# Patient Record
Sex: Female | Born: 1940
Health system: Southern US, Community
[De-identification: ages and names within clinical notes are randomized; demographics above are authoritative.]

## PROBLEM LIST (undated history)

## (undated) DIAGNOSIS — M8580 Other specified disorders of bone density and structure, unspecified site: Secondary | ICD-10-CM

## (undated) DIAGNOSIS — N3941 Urge incontinence: Secondary | ICD-10-CM

## (undated) DIAGNOSIS — S82009A Unspecified fracture of unspecified patella, initial encounter for closed fracture: Secondary | ICD-10-CM

## (undated) DIAGNOSIS — I10 Essential (primary) hypertension: Secondary | ICD-10-CM

## (undated) DIAGNOSIS — C801 Malignant (primary) neoplasm, unspecified: Secondary | ICD-10-CM

## (undated) DIAGNOSIS — H269 Unspecified cataract: Secondary | ICD-10-CM

## (undated) DIAGNOSIS — M1990 Unspecified osteoarthritis, unspecified site: Secondary | ICD-10-CM

## (undated) DIAGNOSIS — K635 Polyp of colon: Secondary | ICD-10-CM

## (undated) HISTORY — PX: EYE SURGERY: SHX253

## (undated) HISTORY — PX: COLONOSCOPY: SHX5424

## (undated) HISTORY — PX: TUBAL LIGATION: SHX77

## (undated) HISTORY — DX: Urge incontinence: N39.41

## (undated) HISTORY — DX: Polyp of colon: K63.5

## (undated) HISTORY — PX: TONSILLECTOMY: SUR1361

## (undated) HISTORY — DX: Unspecified cataract: H26.9

## (undated) HISTORY — DX: Other specified disorders of bone density and structure, unspecified site: M85.80

## (undated) HISTORY — DX: Unspecified fracture of unspecified patella, initial encounter for closed fracture: S82.009A

---

## 2009-10-11 DIAGNOSIS — M8580 Other specified disorders of bone density and structure, unspecified site: Secondary | ICD-10-CM

## 2009-10-11 HISTORY — DX: Other specified disorders of bone density and structure, unspecified site: M85.80

## 2009-10-11 LAB — HM DEXA SCAN

## 2010-11-20 ENCOUNTER — Other Ambulatory Visit: Payer: Self-pay | Admitting: Family Medicine

## 2010-11-20 DIAGNOSIS — R921 Mammographic calcification found on diagnostic imaging of breast: Secondary | ICD-10-CM

## 2010-11-22 ENCOUNTER — Ambulatory Visit
Admission: RE | Admit: 2010-11-22 | Discharge: 2010-11-22 | Disposition: A | Discharge status: Home / Self Care | Payer: Medicare Other | Source: Ambulatory Visit | Attending: Family Medicine | Admitting: Family Medicine

## 2010-11-22 ENCOUNTER — Other Ambulatory Visit: Payer: Self-pay | Admitting: Family Medicine

## 2010-11-22 ENCOUNTER — Other Ambulatory Visit: Payer: Self-pay | Admitting: Diagnostic Radiology

## 2010-11-22 DIAGNOSIS — R921 Mammographic calcification found on diagnostic imaging of breast: Secondary | ICD-10-CM

## 2011-08-23 DIAGNOSIS — E785 Hyperlipidemia, unspecified: Secondary | ICD-10-CM | POA: Diagnosis not present

## 2011-08-23 DIAGNOSIS — E559 Vitamin D deficiency, unspecified: Secondary | ICD-10-CM | POA: Diagnosis not present

## 2011-08-23 DIAGNOSIS — R5383 Other fatigue: Secondary | ICD-10-CM | POA: Diagnosis not present

## 2011-08-23 DIAGNOSIS — R5381 Other malaise: Secondary | ICD-10-CM | POA: Diagnosis not present

## 2011-08-26 DIAGNOSIS — N3946 Mixed incontinence: Secondary | ICD-10-CM | POA: Diagnosis not present

## 2011-08-26 DIAGNOSIS — I10 Essential (primary) hypertension: Secondary | ICD-10-CM | POA: Diagnosis not present

## 2011-08-26 DIAGNOSIS — E785 Hyperlipidemia, unspecified: Secondary | ICD-10-CM | POA: Diagnosis not present

## 2011-09-24 DIAGNOSIS — Z0000 Encounter for general adult medical examination without abnormal findings: Secondary | ICD-10-CM | POA: Diagnosis not present

## 2011-09-24 DIAGNOSIS — Z124 Encounter for screening for malignant neoplasm of cervix: Secondary | ICD-10-CM | POA: Diagnosis not present

## 2011-10-23 DIAGNOSIS — H04129 Dry eye syndrome of unspecified lacrimal gland: Secondary | ICD-10-CM | POA: Diagnosis not present

## 2011-11-07 ENCOUNTER — Other Ambulatory Visit: Payer: Self-pay | Admitting: Family Medicine

## 2011-11-07 DIAGNOSIS — Z1231 Encounter for screening mammogram for malignant neoplasm of breast: Secondary | ICD-10-CM

## 2011-11-27 ENCOUNTER — Ambulatory Visit
Admission: RE | Admit: 2011-11-27 | Discharge: 2011-11-27 | Disposition: A | Discharge status: Home / Self Care | Payer: Medicare Other | Source: Ambulatory Visit | Attending: Family Medicine | Admitting: Family Medicine

## 2011-11-27 DIAGNOSIS — Z1231 Encounter for screening mammogram for malignant neoplasm of breast: Secondary | ICD-10-CM

## 2012-02-06 DIAGNOSIS — Z23 Encounter for immunization: Secondary | ICD-10-CM | POA: Diagnosis not present

## 2012-08-24 DIAGNOSIS — S82009A Unspecified fracture of unspecified patella, initial encounter for closed fracture: Secondary | ICD-10-CM | POA: Diagnosis not present

## 2012-08-25 ENCOUNTER — Encounter (HOSPITAL_COMMUNITY): Payer: Self-pay

## 2012-08-25 NOTE — Pre-Procedure Instructions (Signed)
MITZI LILJA  08/25/2012   Your procedure is scheduled on: Friday, April 18th             (Take [place] Elevators to the 3rd floor)   Report to [facility] at 5:30 AM.  Call this number if you have problems the morning of surgery: [phone]   Remember:   Do not eat food or drink liquids after midnight Thursday.   Take these medicines the morning of surgery with A SIP OF WATER: Coreg, Eye drops, Detrol   Do not wear jewelry, make-up or nail polish.  Do not wear lotions, powders, or perfumes. You may NOT wear deodorant.  Do not shave underarms & legs 48 hours prior to surgery.     Do not bring valuables to the hospital.  Contacts, dentures or bridgework may not be worn into surgery.   Leave suitcase in the car. After surgery it may be brought to your room.  For patients admitted to the hospital, checkout time is 11:00 AM the day of discharge.   Patients discharged the day of surgery will not be allowed to drive home.   Name and phone number of your driver:    Special Instructions: Shower using CHG 2 nights before surgery and the night before surgery.  If you shower the day of surgery use CHG.  Use special wash - you have one bottle of CHG for all showers.  You should use approximately 1/3 of the bottle for each shower.   Please read over the following fact sheets that you were given: Pain Booklet, Coughing and Deep Breathing, MRSA Information and Surgical Site Infection Prevention

## 2012-08-26 ENCOUNTER — Encounter (HOSPITAL_COMMUNITY)
Admission: RE | Admit: 2012-08-26 | Discharge: 2012-08-26 | Disposition: A | Discharge status: Home / Self Care | Payer: Medicare Other | Source: Ambulatory Visit | Attending: Orthopedic Surgery | Admitting: Orthopedic Surgery

## 2012-08-26 ENCOUNTER — Encounter (HOSPITAL_COMMUNITY): Payer: Self-pay

## 2012-08-26 ENCOUNTER — Other Ambulatory Visit (HOSPITAL_COMMUNITY): Payer: Self-pay | Admitting: Orthopedic Surgery

## 2012-08-26 DIAGNOSIS — Z01818 Encounter for other preprocedural examination: Secondary | ICD-10-CM | POA: Diagnosis not present

## 2012-08-26 DIAGNOSIS — Z79899 Other long term (current) drug therapy: Secondary | ICD-10-CM | POA: Diagnosis not present

## 2012-08-26 DIAGNOSIS — Z01810 Encounter for preprocedural cardiovascular examination: Secondary | ICD-10-CM | POA: Diagnosis not present

## 2012-08-26 DIAGNOSIS — I10 Essential (primary) hypertension: Secondary | ICD-10-CM | POA: Diagnosis not present

## 2012-08-26 DIAGNOSIS — S82009A Unspecified fracture of unspecified patella, initial encounter for closed fracture: Secondary | ICD-10-CM | POA: Diagnosis not present

## 2012-08-26 DIAGNOSIS — Z01812 Encounter for preprocedural laboratory examination: Secondary | ICD-10-CM | POA: Diagnosis not present

## 2012-08-26 HISTORY — DX: Unspecified osteoarthritis, unspecified site: M19.90

## 2012-08-26 HISTORY — DX: Essential (primary) hypertension: I10

## 2012-08-26 LAB — COMPREHENSIVE METABOLIC PANEL
ALT: 19 U/L (ref 0–35)
AST: 25 U/L (ref 0–37)
Albumin: 4.3 g/dL (ref 3.5–5.2)
CO2: 31 mEq/L (ref 19–32)
Calcium: 10.1 mg/dL (ref 8.4–10.5)
Chloride: 97 mEq/L (ref 96–112)
GFR calc non Af Amer: 87 mL/min — ABNORMAL LOW (ref >90)
Sodium: 136 mEq/L (ref 135–145)
Total Bilirubin: 1 mg/dL (ref 0.3–1.2)

## 2012-08-26 LAB — CBC
Platelets: 254 10*3/uL (ref 150–400)
RBC: 4.29 MIL/uL (ref 3.87–5.11)
WBC: 7.5 10*3/uL (ref 4.0–10.5)

## 2012-08-26 LAB — SURGICAL PCR SCREEN: MRSA, PCR: NEGATIVE

## 2012-08-26 NOTE — Progress Notes (Signed)
Pt. Followed by Arneta Cliche, PA at [facility]. ? Last ekg, states she had a stress test several yrs. Ago, due to problems regulating her BP meds. No recent cardiac review /w cardiologist.

## 2012-08-27 MED ORDER — CEFAZOLIN SODIUM-DEXTROSE 2-3 GM-% IV SOLR
2.0000 g | INTRAVENOUS | Status: AC
  Administered 2012-08-28: 2 g via INTRAVENOUS
  Filled 2012-08-27: qty 50

## 2012-08-28 ENCOUNTER — Encounter (HOSPITAL_COMMUNITY): Payer: Self-pay | Admitting: Anesthesiology

## 2012-08-28 ENCOUNTER — Ambulatory Visit (HOSPITAL_COMMUNITY): Payer: Medicare Other | Admitting: Anesthesiology

## 2012-08-28 ENCOUNTER — Observation Stay (HOSPITAL_COMMUNITY)
Admission: RE | Admit: 2012-08-28 | Discharge: 2012-08-29 | Disposition: A | Discharge status: Home / Self Care | Payer: Medicare Other | Source: Ambulatory Visit | Attending: Orthopedic Surgery | Admitting: Orthopedic Surgery

## 2012-08-28 ENCOUNTER — Encounter (HOSPITAL_COMMUNITY)
Admission: RE | Disposition: A | Discharge status: Home / Self Care | Payer: Self-pay | Source: Ambulatory Visit | Attending: Orthopedic Surgery

## 2012-08-28 DIAGNOSIS — I10 Essential (primary) hypertension: Secondary | ICD-10-CM | POA: Diagnosis not present

## 2012-08-28 DIAGNOSIS — X58XXXA Exposure to other specified factors, initial encounter: Secondary | ICD-10-CM | POA: Insufficient documentation

## 2012-08-28 DIAGNOSIS — Z01810 Encounter for preprocedural cardiovascular examination: Secondary | ICD-10-CM | POA: Insufficient documentation

## 2012-08-28 DIAGNOSIS — Z01818 Encounter for other preprocedural examination: Secondary | ICD-10-CM | POA: Insufficient documentation

## 2012-08-28 DIAGNOSIS — Z79899 Other long term (current) drug therapy: Secondary | ICD-10-CM | POA: Insufficient documentation

## 2012-08-28 DIAGNOSIS — S82009A Unspecified fracture of unspecified patella, initial encounter for closed fracture: Principal | ICD-10-CM | POA: Insufficient documentation

## 2012-08-28 DIAGNOSIS — G8918 Other acute postprocedural pain: Secondary | ICD-10-CM | POA: Diagnosis not present

## 2012-08-28 DIAGNOSIS — Z01812 Encounter for preprocedural laboratory examination: Secondary | ICD-10-CM | POA: Diagnosis not present

## 2012-08-28 DIAGNOSIS — S82001A Unspecified fracture of right patella, initial encounter for closed fracture: Secondary | ICD-10-CM

## 2012-08-28 HISTORY — PX: ORIF PATELLA: SHX5033

## 2012-08-28 SURGERY — OPEN REDUCTION INTERNAL FIXATION (ORIF) PATELLA
Anesthesia: General | Site: Knee | Laterality: Right | Wound class: Clean

## 2012-08-28 MED ORDER — HYDROMORPHONE HCL PF 1 MG/ML IJ SOLN: 0.2500 mg | INTRAMUSCULAR | Status: DC | PRN

## 2012-08-28 MED ORDER — PROPOFOL 10 MG/ML IV BOLUS
INTRAVENOUS | Status: DC | PRN
  Administered 2012-08-28: 140 mg via INTRAVENOUS

## 2012-08-28 MED ORDER — ONDANSETRON HCL 4 MG/2ML IJ SOLN: 4.0000 mg | Freq: Once | INTRAMUSCULAR | Status: DC | PRN

## 2012-08-28 MED ORDER — CARVEDILOL 6.25 MG PO TABS
6.2500 mg | ORAL_TABLET | Freq: Two times a day (BID) | ORAL | Status: DC
  Administered 2012-08-28 – 2012-08-29 (×2): 6.25 mg via ORAL
  Filled 2012-08-28 (×4): qty 1

## 2012-08-28 MED ORDER — PHENYLEPHRINE HCL 10 MG/ML IJ SOLN
INTRAMUSCULAR | Status: DC | PRN
  Administered 2012-08-28 (×5): 80 ug via INTRAVENOUS

## 2012-08-28 MED ORDER — METOCLOPRAMIDE HCL 10 MG PO TABS
5.0000 mg | ORAL_TABLET | Freq: Three times a day (TID) | ORAL | Status: DC | PRN
Start: 2012-08-28 — End: 2012-08-29

## 2012-08-28 MED ORDER — LIDOCAINE HCL (CARDIAC) 20 MG/ML IV SOLN
INTRAVENOUS | Status: DC | PRN
  Administered 2012-08-28: 80 mg via INTRAVENOUS

## 2012-08-28 MED ORDER — ARTIFICIAL TEARS OP OINT
TOPICAL_OINTMENT | OPHTHALMIC | Status: DC | PRN
  Administered 2012-08-28: 1 via OPHTHALMIC

## 2012-08-28 MED ORDER — ONDANSETRON HCL 4 MG/2ML IJ SOLN: 4.0000 mg | Freq: Four times a day (QID) | INTRAMUSCULAR | Status: DC | PRN

## 2012-08-28 MED ORDER — CEFAZOLIN SODIUM 1-5 GM-% IV SOLN
1.0000 g | Freq: Four times a day (QID) | INTRAVENOUS | Status: AC
  Administered 2012-08-28 – 2012-08-29 (×3): 1 g via INTRAVENOUS
  Filled 2012-08-28 (×3): qty 50

## 2012-08-28 MED ORDER — 0.9 % SODIUM CHLORIDE (POUR BTL) OPTIME
TOPICAL | Status: DC | PRN
  Administered 2012-08-28: 1000 mL

## 2012-08-28 MED ORDER — FESOTERODINE FUMARATE ER 4 MG PO TB24
4.0000 mg | ORAL_TABLET | Freq: Every day | ORAL | Status: DC
  Administered 2012-08-29: 4 mg via ORAL
  Filled 2012-08-28: qty 1

## 2012-08-28 MED ORDER — EPHEDRINE SULFATE 50 MG/ML IJ SOLN
INTRAMUSCULAR | Status: DC | PRN
  Administered 2012-08-28 (×3): 10 mg via INTRAVENOUS
  Administered 2012-08-28: 5 mg via INTRAVENOUS
  Administered 2012-08-28: 10 mg via INTRAVENOUS

## 2012-08-28 MED ORDER — HYDROCODONE-ACETAMINOPHEN 5-325 MG PO TABS
1.0000 | ORAL_TABLET | ORAL | Status: DC | PRN
Start: 2012-08-28 — End: 2012-08-29

## 2012-08-28 MED ORDER — FENTANYL CITRATE 0.05 MG/ML IJ SOLN
INTRAMUSCULAR | Status: DC | PRN
  Administered 2012-08-28: 50 ug via INTRAVENOUS
  Administered 2012-08-28 (×2): 25 ug via INTRAVENOUS

## 2012-08-28 MED ORDER — OXYCODONE-ACETAMINOPHEN 5-325 MG PO TABS
1.0000 | ORAL_TABLET | ORAL | Status: DC | PRN
  Administered 2012-08-28 – 2012-08-29 (×4): 2 via ORAL
  Filled 2012-08-28 (×4): qty 2

## 2012-08-28 MED ORDER — HYDROMORPHONE HCL PF 1 MG/ML IJ SOLN: 0.5000 mg | INTRAMUSCULAR | Status: DC | PRN

## 2012-08-28 MED ORDER — ASPIRIN EC 325 MG PO TBEC
325.0000 mg | DELAYED_RELEASE_TABLET | Freq: Every day | ORAL | Status: DC
  Administered 2012-08-28 – 2012-08-29 (×2): 325 mg via ORAL
  Filled 2012-08-28 (×2): qty 1

## 2012-08-28 MED ORDER — HYDROCHLOROTHIAZIDE 12.5 MG PO CAPS
12.5000 mg | ORAL_CAPSULE | Freq: Every day | ORAL | Status: DC
  Administered 2012-08-28 – 2012-08-29 (×2): 12.5 mg via ORAL
  Filled 2012-08-28 (×2): qty 1

## 2012-08-28 MED ORDER — ONDANSETRON HCL 4 MG PO TABS: 4.0000 mg | ORAL_TABLET | Freq: Four times a day (QID) | ORAL | Status: DC | PRN

## 2012-08-28 MED ORDER — ATORVASTATIN CALCIUM 10 MG PO TABS
10.0000 mg | ORAL_TABLET | Freq: Every day | ORAL | Status: DC
  Administered 2012-08-28: 10 mg via ORAL
  Filled 2012-08-28 (×2): qty 1

## 2012-08-28 MED ORDER — ONDANSETRON HCL 4 MG/2ML IJ SOLN
INTRAMUSCULAR | Status: DC | PRN
  Administered 2012-08-28: 4 mg via INTRAVENOUS

## 2012-08-28 MED ORDER — SODIUM CHLORIDE 0.9 % IV SOLN
INTRAVENOUS | Status: DC
  Administered 2012-08-28: 15:00:00 via INTRAVENOUS

## 2012-08-28 MED ORDER — METOCLOPRAMIDE HCL 5 MG/ML IJ SOLN: 5.0000 mg | Freq: Three times a day (TID) | INTRAMUSCULAR | Status: DC | PRN

## 2012-08-28 MED ORDER — LISINOPRIL-HYDROCHLOROTHIAZIDE 20-25 MG PO TABS: 0.5000 | ORAL_TABLET | Freq: Every day | ORAL | Status: DC

## 2012-08-28 MED ORDER — LACTATED RINGERS IV SOLN
INTRAVENOUS | Status: DC | PRN
  Administered 2012-08-28 (×2): via INTRAVENOUS

## 2012-08-28 MED ORDER — LISINOPRIL 10 MG PO TABS
10.0000 mg | ORAL_TABLET | Freq: Every day | ORAL | Status: DC
  Administered 2012-08-28 – 2012-08-29 (×2): 10 mg via ORAL
  Filled 2012-08-28 (×2): qty 1

## 2012-08-28 SURGICAL SUPPLY — 60 items
BANDAGE ELASTIC 4 VELCRO ST LF (GAUZE/BANDAGES/DRESSINGS) IMPLANT
BANDAGE ELASTIC 6 VELCRO ST LF (GAUZE/BANDAGES/DRESSINGS) IMPLANT
BANDAGE GAUZE ELAST BULKY 4 IN (GAUZE/BANDAGES/DRESSINGS) ×2 IMPLANT
BLADE SURG ROTATE 9660 (MISCELLANEOUS) IMPLANT
BNDG COHESIVE 6X5 TAN STRL LF (GAUZE/BANDAGES/DRESSINGS) ×2 IMPLANT
CLOTH BEACON ORANGE TIMEOUT ST (SAFETY) ×2 IMPLANT
COVER SURGICAL LIGHT HANDLE (MISCELLANEOUS) ×2 IMPLANT
CUFF TOURNIQUET SINGLE 34IN LL (TOURNIQUET CUFF) IMPLANT
CUFF TOURNIQUET SINGLE 44IN (TOURNIQUET CUFF) IMPLANT
DRAPE C-ARM 42X72 X-RAY (DRAPES) IMPLANT
DRSG ADAPTIC 3X8 NADH LF (GAUZE/BANDAGES/DRESSINGS) ×2 IMPLANT
DRSG PAD ABDOMINAL 8X10 ST (GAUZE/BANDAGES/DRESSINGS) ×2 IMPLANT
ELECT REM PT RETURN 9FT ADLT (ELECTROSURGICAL) ×2
ELECTRODE REM PT RTRN 9FT ADLT (ELECTROSURGICAL) ×1 IMPLANT
GLOVE BIO SURGEON STRL SZ8.5 (GLOVE) ×2 IMPLANT
GLOVE BIOGEL PI IND STRL 7.0 (GLOVE) ×1 IMPLANT
GLOVE BIOGEL PI IND STRL 9 (GLOVE) ×1 IMPLANT
GLOVE BIOGEL PI INDICATOR 7.0 (GLOVE) ×1
GLOVE BIOGEL PI INDICATOR 9 (GLOVE) ×1
GLOVE SURG ORTHO 9.0 STRL STRW (GLOVE) ×2 IMPLANT
GLOVE SURG SS PI 7.0 STRL IVOR (GLOVE) ×2 IMPLANT
GLOVE SURG SS PI 8.5 STRL IVOR (GLOVE) ×1
GLOVE SURG SS PI 8.5 STRL STRW (GLOVE) ×1 IMPLANT
GOWN PREVENTION PLUS XLARGE (GOWN DISPOSABLE) ×2 IMPLANT
GOWN SRG XL XLNG 56XLVL 4 (GOWN DISPOSABLE) ×1 IMPLANT
GOWN STRL NON-REIN XL XLG LVL4 (GOWN DISPOSABLE) ×1
GOWN STRL REIN XL XLG (GOWN DISPOSABLE) ×2 IMPLANT
GUIDEWIRE THREADED 2.8 (WIRE) ×4 IMPLANT
IMMOBILIZER KNEE 20 (SOFTGOODS) ×2
IMMOBILIZER KNEE 20 THIGH 36 (SOFTGOODS) ×1 IMPLANT
IMMOBILIZER KNEE 22 UNIV (SOFTGOODS) ×2 IMPLANT
KIT BASIN OR (CUSTOM PROCEDURE TRAY) ×2 IMPLANT
KIT ROOM TURNOVER OR (KITS) ×2 IMPLANT
MANIFOLD NEPTUNE II (INSTRUMENTS) ×2 IMPLANT
NS IRRIG 1000ML POUR BTL (IV SOLUTION) ×2 IMPLANT
PACK ORTHO EXTREMITY (CUSTOM PROCEDURE TRAY) ×2 IMPLANT
PAD ARMBOARD 7.5X6 YLW CONV (MISCELLANEOUS) ×2 IMPLANT
PAD CAST 4YDX4 CTTN HI CHSV (CAST SUPPLIES) IMPLANT
PADDING CAST COTTON 4X4 STRL (CAST SUPPLIES)
SCREW CANN 16 THRD/35 7.3 (Screw) ×2 IMPLANT
SCREW CANN 16 THRD/40 7.3 (Screw) ×2 IMPLANT
SCREW CANN 16 THRD/45 7.3 (Screw) ×2 IMPLANT
SPONGE GAUZE 4X4 12PLY (GAUZE/BANDAGES/DRESSINGS) ×2 IMPLANT
SPONGE LAP 18X18 X RAY DECT (DISPOSABLE) ×2 IMPLANT
SPONGE LAP 4X18 X RAY DECT (DISPOSABLE) IMPLANT
STAPLER VISISTAT 35W (STAPLE) ×2 IMPLANT
STOCKINETTE IMPERVIOUS LG (DRAPES) ×2 IMPLANT
SUCTION FRAZIER TIP 10 FR DISP (SUCTIONS) IMPLANT
SUT ETHILON 3 0 FSL (SUTURE) ×2 IMPLANT
SUT STEEL 5 (SUTURE) IMPLANT
SUT VIC AB 0 CT1 27 (SUTURE) ×1
SUT VIC AB 0 CT1 27XBRD ANBCTR (SUTURE) ×1 IMPLANT
SUT VIC AB 2-0 CT1 27 (SUTURE) ×1
SUT VIC AB 2-0 CT1 TAPERPNT 27 (SUTURE) ×1 IMPLANT
TOWEL OR 17X24 6PK STRL BLUE (TOWEL DISPOSABLE) ×2 IMPLANT
TOWEL OR 17X26 10 PK STRL BLUE (TOWEL DISPOSABLE) ×2 IMPLANT
TUBE CONNECTING 12X1/4 (SUCTIONS) ×2 IMPLANT
UNDERPAD 30X30 INCONTINENT (UNDERPADS AND DIAPERS) IMPLANT
WATER STERILE IRR 1000ML POUR (IV SOLUTION) IMPLANT
YANKAUER SUCT BULB TIP NO VENT (SUCTIONS) ×2 IMPLANT

## 2012-08-28 NOTE — H&P (Signed)
Terry Mcneil is an 72 y.o. female.   Chief Complaint: Right patellar fracture HPI: Patient is a 72 year old woman who over a month ago sustained a fracture of the right patella. Patient noticed weakness present at this time for evaluation radiographs shows a displaced right patella fracture. She does not have active extension and presents at this time for open reduction internal fixation.  Past Medical History  Diagnosis Date  . Hypertension     stress test 10-46yrs. ago, states that it was normal & she didn't requiure any further cardiac care. States that [facility] [address] take care of her meds for her cardiac needs     . Arthritis     fx- of R patella , also has stiffness in back & knees     Past Surgical History  Procedure Laterality Date  . Tubal ligation    . Vaginal delivery      x2 -   . Tonsillectomy    . Eye surgery      cataracts removed - bilateral     No family history on file. Social History:  reports that she quit smoking about 34 years ago. She does not have any smokeless tobacco history on file. She reports that  drinks alcohol. She reports that she does not use illicit drugs.  Allergies: No Known Allergies  Medications Prior to Admission  Medication Sig Dispense Refill  . aspirin EC 81 MG tablet Take 81 mg by mouth daily.      Marland Kitchen atorvastatin (LIPITOR) 20 MG tablet Take 10 mg by mouth daily with breakfast.       . calcium-vitamin D (OSCAL WITH D) 500-200 MG-UNIT per tablet Take 1 tablet by mouth 2 (two) times daily.      . carvedilol (COREG) 6.25 MG tablet Take 6.25 mg by mouth 2 (two) times daily with a meal.      . hydroxypropyl methylcellulose (ISOPTO TEARS) 2.5 % ophthalmic solution Place 1 drop into both eyes 4 (four) times daily as needed (dry eyes).      Marland Kitchen lisinopril-hydrochlorothiazide (PRINZIDE,ZESTORETIC) 20-25 MG per tablet Take 0.5 tablets by mouth daily.      . Multiple Vitamin (MULTIVITAMIN WITH MINERALS) TABS Take 1 tablet by  mouth daily.      . naproxen sodium (ANAPROX) 220 MG tablet Take 440 mg by mouth 2 (two) times daily with a meal.      . tolterodine (DETROL LA) 4 MG 24 hr capsule Take 4 mg by mouth daily with breakfast.       . VITAMIN D, CHOLECALCIFEROL, PO Take 1 tablet by mouth daily.        Results for orders placed during the hospital encounter of 08/26/12 (from the past 48 hour(s))  APTT     Status: None   Collection Time    08/26/12  3:17 PM      Result Value Range   aPTT 34  24 - 37 seconds  CBC     Status: None   Collection Time    08/26/12  3:17 PM      Result Value Range   WBC 7.5  4.0 - 10.5 K/uL   RBC 4.29  3.87 - 5.11 MIL/uL   Hemoglobin 14.0  12.0 - 15.0 g/dL   HCT 40.9  81.1 - 91.4 %   MCV 92.3  78.0 - 100.0 fL   MCH 32.6  26.0 - 34.0 pg   MCHC 35.4  30.0 - 36.0  g/dL   RDW 46.9  62.9 - 52.8 %   Platelets 254  150 - 400 K/uL  COMPREHENSIVE METABOLIC PANEL     Status: Abnormal   Collection Time    08/26/12  3:17 PM      Result Value Range   Sodium 136  135 - 145 mEq/L   Potassium 4.1  3.5 - 5.1 mEq/L   Chloride 97  96 - 112 mEq/L   CO2 31  19 - 32 mEq/L   Glucose, Bld 102 (*) 70 - 99 mg/dL   BUN 17  6 - 23 mg/dL   Creatinine, Ser 4.13  0.50 - 1.10 mg/dL   Calcium 24.4  8.4 - 01.0 mg/dL   Total Protein 7.5  6.0 - 8.3 g/dL   Albumin 4.3  3.5 - 5.2 g/dL   AST 25  0 - 37 U/L   ALT 19  0 - 35 U/L   Alkaline Phosphatase 84  39 - 117 U/L   Total Bilirubin 1.0  0.3 - 1.2 mg/dL   GFR calc non Af Amer 87 (*) >90 mL/min   GFR calc Af Amer >90  >90 mL/min   Comment:            The eGFR has been calculated     using the CKD EPI equation.     This calculation has not been     validated in all clinical     situations.     eGFR's persistently     <90 mL/min signify     possible Chronic Kidney Disease.  PROTIME-INR     Status: None   Collection Time    08/26/12  3:17 PM      Result Value Range   Prothrombin Time 13.4  11.6 - 15.2 seconds   INR 1.03  0.00 - 1.49  SURGICAL PCR  SCREEN     Status: None   Collection Time    08/26/12  3:19 PM      Result Value Range   MRSA, PCR NEGATIVE  NEGATIVE   Staphylococcus aureus NEGATIVE  NEGATIVE   Comment:            The Xpert SA Assay (FDA     approved for NASAL specimens     in patients over 45 years of age),     is one component of     a comprehensive surveillance     program.  Test performance has     been validated by [employer] for patients greater     than or equal to 93 year old.     It is not intended     to diagnose infection nor to     guide or monitor treatment.   Dg Chest 2 View  08/26/2012  *RADIOLOGY REPORT*  Clinical Data: Preop for patellar fracture  CHEST - 2 VIEW  Comparison: None.  Findings: Cardiomediastinal silhouette is unremarkable.  Mild hyperinflation.  No acute infiltrate or pleural effusion.  No pulmonary edema.  Bony thorax is unremarkable.  IMPRESSION: Mild hyperinflation.  No active disease.   Original Report Authenticated By: Natasha Mead, M.D.     Review of Systems  All other systems reviewed and are negative.    There were no vitals taken for this visit. Physical Exam  Examination patient has a palpable defect of her patella. She does not have active extension. The skin is intact. Assessment/Plan  Assessment: Closed displaced fracture right patella.  Plan:  We'll plan for open reduction internal fixation. Risks and benefits were discussed including infection neurovascular injury pain need for additional surgery. Patient states she understands was to proceed at this time.  [name] V 08/28/2012, 7:14 AM

## 2012-08-28 NOTE — Anesthesia Procedure Notes (Signed)
Anesthesia Regional Block:  Femoral nerve block  Pre-Anesthetic Checklist: ,, timeout performed, Correct Patient, Correct Site, Correct Laterality, Correct Procedure, Correct Position, site marked, Risks and benefits discussed,  Surgical consent,  Pre-op evaluation,  At surgeon's request and post-op pain management  Laterality: Right  Prep: Maximum Sterile Barrier Precautions used, chloraprep and alcohol swabs       Needles:  Injection technique: Single-shot  Needle Type: Stimulator Needle - 80        Needle insertion depth: 5 cm   Additional Needles:  Procedures: nerve stimulator Femoral nerve block  Nerve Stimulator or Paresthesia:  Response: 0.5 mA, 0.1 ms, 6 cm  Additional Responses:   Narrative:  Start time: 08/28/2012 6:55 AM End time: 08/28/2012 7:00 AM Injection made incrementally with aspirations every 5 mL.  Performed by: Personally  Anesthesiologist: Maren Beach MD  Additional Notes: Pt accepts procedure and risks. 20cc 0.5% Marcaine w/ epi w/o difficulty or discomfort. GES

## 2012-08-28 NOTE — Op Note (Signed)
OPERATIVE REPORT  DATE OF SURGERY: 08/28/2012  PATIENT:  Terry Mcneil,  72 y.o. female  PRE-OPERATIVE DIAGNOSIS:  Right Patella Fracture  POST-OPERATIVE DIAGNOSIS:  Right Patella Fracture  PROCEDURE:  Procedure(s): OPEN REDUCTION INTERNAL (ORIF) FIXATION PATELLA- RIGHT  SURGEON:  Surgeon(s): Nadara Mustard, MD  ANESTHESIA:   general  EBL:  min ML  SPECIMEN:  No Specimen  TOURNIQUET:  * No tourniquets in log *  PROCEDURE DETAILS: Patient is a 72 year old woman who presents with a displaced patella fracture she is unable to extend her knee she has a displaced fracture presents at this time for open reduction internal fixation. Risks and benefits were discussed including infection neurovascular injury pain arthritis need for additional surgery. Patient states she understands and wished to proceed at this time. Description of procedure patient brought to the operating room and underwent a general anesthetic. After adequate levels of anesthesia were obtained patient's right lower extremity was prepped using DuraPrep and draped into a sterile field. A dorsal incision was made over the patella this was carried down to the fracture. The fracture edges were freshened. The knee was irrigated with normal saline. 2 K wires were used to reduce the fracture these were then overdrilled and 27.3 cannulated screws were used to secure the fracture. Using a figure 8 tension band this was then secured through the screws. C-arm fluoroscopy verified reduction. The wound was again irrigated the subcutaneous is closed using 2-0 Vicryl the skin was closed using 2-0 nylon. The wound was covered with Adaptic orthopedic sponges ABDs Kerlix and Coban. Patient was placed in knee immobilizer extubated taken to the PACU in stable condition.  PLAN OF CARE: Admit for overnight observation  PATIENT DISPOSITION:  PACU - hemodynamically stable.   Nadara Mustard, MD 08/28/2012 8:21 AM

## 2012-08-28 NOTE — Anesthesia Preprocedure Evaluation (Signed)
Anesthesia Evaluation  Patient identified by MRN, date of birth, ID band Patient awake    Reviewed: Allergy & Precautions, H&P , NPO status , Patient's Chart, lab work & pertinent test results  Airway Mallampati: I TM Distance: >3 FB Neck ROM: full    Dental   Pulmonary former smoker,          Cardiovascular hypertension, Rhythm:regular Rate:Normal     Neuro/Psych    GI/Hepatic   Endo/Other    Renal/GU      Musculoskeletal   Abdominal   Peds  Hematology   Anesthesia Other Findings   Reproductive/Obstetrics                           Anesthesia Physical Anesthesia Plan  ASA: II  Anesthesia Plan: General   Post-op Pain Management:    Induction: Intravenous  Airway Management Planned: LMA and Oral ETT  Additional Equipment:   Intra-op Plan:   Post-operative Plan: Extubation in OR  Informed Consent: I have reviewed the patients History and Physical, chart, labs and discussed the procedure including the risks, benefits and alternatives for the proposed anesthesia with the patient or authorized representative who has indicated his/her understanding and acceptance.     Plan Discussed with: CRNA, Anesthesiologist and Surgeon  Anesthesia Plan Comments:         Anesthesia Quick Evaluation

## 2012-08-28 NOTE — Preoperative (Signed)
Beta Blockers   Reason not to administer Beta Blockers:Not Applicable 

## 2012-08-28 NOTE — Anesthesia Postprocedure Evaluation (Signed)
  Anesthesia Post-op Note  Patient: Terry Mcneil  Procedure(s) Performed: Procedure(s) with comments: OPEN REDUCTION INTERNAL (ORIF) FIXATION PATELLA- RIGHT (Right) - Open Reduction Internal Fixation Right Patella  Patient Location: PACU  Anesthesia Type:General  Level of Consciousness: awake, oriented and patient cooperative  Airway and Oxygen Therapy: Patient Spontanous Breathing  Post-op Pain: mild  Post-op Assessment: Post-op Vital signs reviewed, Patient's Cardiovascular Status Stable, Respiratory Function Stable, Patent Airway, No signs of Nausea or vomiting and Pain level controlled  Post-op Vital Signs: stable  Complications: No apparent anesthesia complications

## 2012-08-28 NOTE — Transfer of Care (Signed)
Immediate Anesthesia Transfer of Care Note  Patient: Terry Mcneil  Procedure(s) Performed: Procedure(s) with comments: OPEN REDUCTION INTERNAL (ORIF) FIXATION PATELLA- RIGHT (Right) - Open Reduction Internal Fixation Right Patella  Patient Location: PACU  Anesthesia Type:GA combined with regional for post-op pain  Level of Consciousness: awake, alert  and oriented  Airway & Oxygen Therapy: Patient Spontanous Breathing and Patient connected to nasal cannula oxygen  Post-op Assessment: Report given to PACU RN  Post vital signs: Reviewed and stable  Complications: No apparent anesthesia complications

## 2012-08-29 DIAGNOSIS — Z79899 Other long term (current) drug therapy: Secondary | ICD-10-CM | POA: Diagnosis not present

## 2012-08-29 DIAGNOSIS — Z01818 Encounter for other preprocedural examination: Secondary | ICD-10-CM | POA: Diagnosis not present

## 2012-08-29 DIAGNOSIS — I10 Essential (primary) hypertension: Secondary | ICD-10-CM | POA: Diagnosis not present

## 2012-08-29 DIAGNOSIS — S82009A Unspecified fracture of unspecified patella, initial encounter for closed fracture: Secondary | ICD-10-CM | POA: Diagnosis not present

## 2012-08-29 DIAGNOSIS — Z01812 Encounter for preprocedural laboratory examination: Secondary | ICD-10-CM | POA: Diagnosis not present

## 2012-08-29 DIAGNOSIS — Z01810 Encounter for preprocedural cardiovascular examination: Secondary | ICD-10-CM | POA: Diagnosis not present

## 2012-08-29 MED ORDER — HYDROCODONE-ACETAMINOPHEN 5-325 MG PO TABS: 1.0000 | ORAL_TABLET | Freq: Four times a day (QID) | ORAL | Status: DC | PRN

## 2012-08-29 NOTE — Discharge Summary (Signed)
  Patient was admitted for observation status post open reduction internal fixation right patella. Patient progressed well was discharged to home in stable condition prescription for Vicodin provided.

## 2012-08-29 NOTE — Evaluation (Signed)
Physical Therapy Evaluation Patient Details Name: Terry Mcneil MRN: [id number] DOB: [birth year] Today's Date: 08/29/2012 Time: 4098-1191 PT Time Calculation (min): 37 min  PT Assessment / Plan / Recommendation Clinical Impression  Pt is a 72 yo female s/p ORIF of R patella fx and is mobilizing well. Pt demo'd good understanding of use of RW and stair negotiation. Pt safe to d/c home when medically stable. Recommend use of RW and 24/7 supervision for safe d/c home. Spouse able to provide 24/7 supervision. Went over how to don socks and pants on R LE due to precautions of no R knee flexion.    PT Assessment  Patient needs continued PT services    Follow Up Recommendations  No PT follow up;Supervision/Assistance - 24 hour (will need outpt PT when approved by Dr. Lajoyce Corners)    Does the patient have the potential to tolerate intense rehabilitation      Barriers to Discharge None      Equipment Recommendations  Rolling walker with 5" wheels    Recommendations for Other Services     Frequency Min 4X/week    Precautions / Restrictions Precautions Precautions: Fall, no R knee ROM Required Braces or Orthoses: Knee Immobilizer - Right Knee Immobilizer - Right: On at all times Restrictions RLE Weight Bearing: Weight bearing as tolerated   Pertinent Vitals/Pain 5/10 R knee pain      Mobility  Bed Mobility Bed Mobility: Supine to Sit;Sitting - Scoot to Edge of Bed Supine to Sit: 5: Supervision;HOB flat Sitting - Scoot to Edge of Bed: 5: Supervision Details for Bed Mobility Assistance: v/c's for long sit technique, pt able to manage R LE independently Transfers Transfers: Sit to Stand;Stand to Sit Sit to Stand: 4: Min guard;With upper extremity assist;From bed;From chair/3-in-1 Stand to Sit: 5: Supervision;With upper extremity assist;To chair/3-in-1 Details for Transfer Assistance: v/c's for R LE mangement and hand placement, increased time but able to complete safe transfer without  physical assist Ambulation/Gait Ambulation/Gait Assistance: 4: Min guard Ambulation Distance (Feet): 25 Feet Assistive device: Rolling walker Ambulation/Gait Assistance Details: ambulation limited by pt feeling "whoozy." she reports breaking out into cold sweat, pt denies dizziness. Gait Pattern: Step-to pattern (approx 50% WBing through R LE) Gait velocity: slow General Gait Details: v/c's for sequencing, no episodes of LOB Stairs: Yes Stair Management Technique: One rail Left;Step to pattern (R HHA to mimic home set up) Number of Stairs: 2    Exercises     PT Diagnosis: Difficulty walking  PT Problem List: Decreased balance;Decreased mobility PT Treatment Interventions: Gait training;Stair training;Functional mobility training;Therapeutic exercise   PT Goals Acute Rehab PT Goals PT Goal Formulation: With patient Time For Goal Achievement: 09/05/12 Potential to Achieve Goals: Good Pt will go Supine/Side to Sit: with modified independence;with HOB 0 degrees PT Goal: Supine/Side to Sit - Progress: Goal set today Pt will go Sit to Supine/Side: with modified independence;with HOB 0 degrees PT Goal: Sit to Supine/Side - Progress: Goal set today Pt will go Sit to Stand: with modified independence;with upper extremity assist (up to RW) PT Goal: Sit to Stand - Progress: Goal set today Pt will Ambulate: 51 - 150 feet;with modified independence;with rolling walker PT Goal: Ambulate - Progress: Goal set today Pt will Go Up / Down Stairs: 3-5 stairs;with supervision;with rail(s) PT Goal: Up/Down Stairs - Progress: Goal set today  Visit Information  Last PT Received On: 08/29/12 Assistance Needed: +1    Subjective Data  Subjective: Pt received supine in bed   Prior  Functioning  Home Living Lives With: Spouse Available Help at Discharge: Family;Available 24 hours/day Type of Home: House Home Access: Stairs to enter [employer] of Steps: 4 Entrance Stairs-Rails:  Left Home Layout: One level Bathroom Shower/Tub: Health visitor: Handicapped height Bathroom Accessibility: Yes How Accessible: Accessible via walker Home Adaptive Equipment: Bedside commode/3-in-1;Walker - standard Prior Function Level of Independence: Independent Able to Take Stairs?: Yes Driving: Yes Vocation: Retired Musician: No difficulties Dominant Hand: Right    Cognition  Cognition Arousal/Alertness: Awake/alert Behavior During Therapy: WFL for tasks assessed/performed Overall Cognitive Status: Within Functional Limits for tasks assessed    Extremity/Trunk Assessment Right Upper Extremity Assessment RUE ROM/Strength/Tone: Within functional levels Left Upper Extremity Assessment LUE ROM/Strength/Tone: Within functional levels Right Lower Extremity Assessment RLE ROM/Strength/Tone: Deficits;Due to pain;Due to precautions RLE ROM/Strength/Tone Deficits: hip and ankle WFL, no ROM to knee - in KI Left Lower Extremity Assessment LLE ROM/Strength/Tone: Within functional levels Trunk Assessment Trunk Assessment: Normal   Balance    End of Session PT - End of Session Equipment Utilized During Treatment: Gait belt;Right knee immobilizer Activity Tolerance: Patient limited by fatigue Patient left: in chair;with call bell/phone within reach Nurse Communication: Mobility status (need for RW for d/c home)  GP Functional Assessment Tool Used: clinical judgement Functional Limitation: Mobility: Walking and moving around Mobility: Walking and Moving Around Current Status ([id number]): At least 20 percent but less than 40 percent impaired, limited or restricted Mobility: Walking and Moving Around Goal Status [phone]): At least 1 percent but less than 20 percent impaired, limited or restricted   Marcene Brawn 08/29/2012, 8:44 AM

## 2012-09-01 ENCOUNTER — Encounter (HOSPITAL_COMMUNITY): Payer: Self-pay | Admitting: Orthopedic Surgery

## 2012-09-14 DIAGNOSIS — S82009A Unspecified fracture of unspecified patella, initial encounter for closed fracture: Secondary | ICD-10-CM | POA: Diagnosis not present

## 2012-09-30 DIAGNOSIS — S82009A Unspecified fracture of unspecified patella, initial encounter for closed fracture: Secondary | ICD-10-CM | POA: Diagnosis not present

## 2012-10-08 ENCOUNTER — Other Ambulatory Visit: Payer: Self-pay | Admitting: Family Medicine

## 2012-10-08 ENCOUNTER — Other Ambulatory Visit: Payer: Medicare Other | Admitting: *Deleted

## 2012-10-08 DIAGNOSIS — E785 Hyperlipidemia, unspecified: Secondary | ICD-10-CM | POA: Diagnosis not present

## 2012-10-08 DIAGNOSIS — E559 Vitamin D deficiency, unspecified: Secondary | ICD-10-CM | POA: Diagnosis not present

## 2012-10-08 DIAGNOSIS — Z124 Encounter for screening for malignant neoplasm of cervix: Secondary | ICD-10-CM | POA: Diagnosis not present

## 2012-10-08 LAB — CBC WITH DIFFERENTIAL/PLATELET
Basophils Absolute: 0 10*3/uL (ref 0.0–0.1)
Basophils Relative: 1 % (ref 0–1)
Eosinophils Absolute: 0.4 10*3/uL (ref 0.0–0.7)
Eosinophils Relative: 6 % — ABNORMAL HIGH (ref 0–5)
HCT: 41.4 % (ref 36.0–46.0)
Hemoglobin: 14.3 g/dL (ref 12.0–15.0)
Lymphocytes Relative: 28 % (ref 12–46)
Lymphs Abs: 1.8 10*3/uL (ref 0.7–4.0)
MCH: 31.8 pg (ref 26.0–34.0)
MCHC: 34.5 g/dL (ref 30.0–36.0)
MCV: 92 fL (ref 78.0–100.0)
Monocytes Absolute: 0.4 10*3/uL (ref 0.1–1.0)
Monocytes Relative: 7 % (ref 3–12)
Neutro Abs: 3.7 10*3/uL (ref 1.7–7.7)
Neutrophils Relative %: 58 % (ref 43–77)
Platelets: 324 10*3/uL (ref 150–400)
RBC: 4.5 MIL/uL (ref 3.87–5.11)
RDW: 13.5 % (ref 11.5–15.5)
WBC: 6.3 10*3/uL (ref 4.0–10.5)

## 2012-10-08 LAB — COMPLETE METABOLIC PANEL WITH GFR
ALT: 20 U/L (ref 0–35)
AST: 23 U/L (ref 0–37)
Albumin: 4.3 g/dL (ref 3.5–5.2)
Alkaline Phosphatase: 88 U/L (ref 39–117)
BUN: 17 mg/dL (ref 6–23)
CO2: 26 mEq/L (ref 19–32)
Calcium: 9.9 mg/dL (ref 8.4–10.5)
Chloride: 102 mEq/L (ref 96–112)
Creat: 0.67 mg/dL (ref 0.50–1.10)
GFR, Est African American: >89 mL/min
GFR, Est Non African American: 88 mL/min
Glucose, Bld: 110 mg/dL — ABNORMAL HIGH (ref 70–99)
Potassium: 4.6 mEq/L (ref 3.5–5.3)
Sodium: 139 mEq/L (ref 135–145)
Total Bilirubin: 0.8 mg/dL (ref 0.3–1.2)
Total Protein: 6.7 g/dL (ref 6.0–8.3)

## 2012-10-08 LAB — LIPID PANEL
Cholesterol: 168 mg/dL (ref 0–200)
HDL: 63 mg/dL (ref >39)
LDL Cholesterol: 82 mg/dL (ref 0–99)
Total CHOL/HDL Ratio: 2.7 Ratio
Triglycerides: 116 mg/dL (ref <150)
VLDL: 23 mg/dL (ref 0–40)

## 2012-10-08 LAB — TSH: TSH: 0.919 u[IU]/mL (ref 0.350–4.500)

## 2012-10-09 LAB — VITAMIN D 25 HYDROXY (VIT D DEFICIENCY, FRACTURES): Vit D, 25-Hydroxy: 49 ng/mL (ref 30–89)

## 2012-10-15 ENCOUNTER — Ambulatory Visit (INDEPENDENT_AMBULATORY_CARE_PROVIDER_SITE_OTHER): Payer: Medicare Other | Admitting: Family Medicine

## 2012-10-15 ENCOUNTER — Encounter: Payer: Self-pay | Admitting: Family Medicine

## 2012-10-15 VITALS — BP 119/80 | HR 81 | Ht 65.0 in | Wt 159.0 lb

## 2012-10-15 DIAGNOSIS — I10 Essential (primary) hypertension: Secondary | ICD-10-CM

## 2012-10-15 DIAGNOSIS — N3281 Overactive bladder: Secondary | ICD-10-CM | POA: Insufficient documentation

## 2012-10-15 DIAGNOSIS — E785 Hyperlipidemia, unspecified: Secondary | ICD-10-CM

## 2012-10-15 DIAGNOSIS — R32 Unspecified urinary incontinence: Secondary | ICD-10-CM

## 2012-10-15 MED ORDER — TOLTERODINE TARTRATE ER 4 MG PO CP24: 4.0000 mg | ORAL_CAPSULE | Freq: Every day | ORAL | Status: DC

## 2012-10-15 MED ORDER — CARVEDILOL 6.25 MG PO TABS: 6.2500 mg | ORAL_TABLET | Freq: Two times a day (BID) | ORAL | Status: DC

## 2012-10-15 MED ORDER — ATORVASTATIN CALCIUM 20 MG PO TABS: 20.0000 mg | ORAL_TABLET | Freq: Every day | ORAL | Status: DC

## 2012-10-15 MED ORDER — LISINOPRIL-HYDROCHLOROTHIAZIDE 20-25 MG PO TABS: 1.0000 | ORAL_TABLET | Freq: Every day | ORAL | Status: DC

## 2012-10-15 NOTE — Patient Instructions (Addendum)
1)  Blood Sugar - Decrease your intake of sugar/bad carbs; you want to do less foods with a high glycemic index; Exercise; Cinnamon 1000 mg and Chromium 1000 mcg.  Be sure and remind [place] the next time you get labs to check your insulin level and a test called the A1c.    Insulin Resistance Blood sugar (glucose) levels are controlled by a hormone called insulin. Insulin is made by your pancreas. When your blood glucose goes up, insulin is released into your blood. Insulin is required for your body to function normally. However, your body can become resistant to your own insulin or to insulin given to treat diabetes. In either case, insulin resistance can lead to serious problems. These problems include:  Type 2 diabetes.  Heart disease.  High blood pressure.  Stroke.  Polycystic ovary syndrome.  Fatty liver. CAUSES  Insulin resistance can develop for many different reasons. It is more likely to happen in people with these conditions or characteristics:  Obesity.  Inactivity.  Pregnancy.  High blood pressure.  Stress.  Steroid use.  Infection or severe illness.  Increased levels of cholesterol and triglycerides. SYMPTOMS  There are no symptoms. You may have symptoms related to the various complications of insulin resistance.  DIAGNOSIS  Several different things can make your caregiver suspect you have insulin resistance. These include:  High blood glucose (hyperglycemia).  Abnormal cholesterol levels.  High uric acid levels.  Changes related to blood pressure.  Changes related to inflammation. Insulin resistance can be determined with blood tests. An elevated insulin level when you have not eaten might suggest resistance. Other more complicated tests are sometimes necessary. TREATMENT  Lifestyle changes are the most important treatment for insulin resistance.   If you are overweight and you have insulin resistance, you can improve your insulin sensitivity by losing  weight.  Moderate exercise for 30 40 minutes, 4 days a week, can improve insulin sensitivity. Some medicines can also help improve your insulin sensitivity. Your caregiver can discuss these with you if they are appropriate.  HOME CARE INSTRUCTIONS   Do not smoke.  Keep your weight at a healthy level.  Get exercise.  If you have diabetes, follow your caregiver's directions.  If you have high blood pressure, follow your caregiver's directions.  Only take prescription medicines for pain, fever, or discomfort as directed by your caregiver. SEEK MEDICAL CARE IF:   You are diabetic and you are having problems keeping your blood glucose levels at target range.  You are having episodes of low blood glucose (hypoglycemia).  You feel you might be having side effects from your medicines.  You have symptoms of an illness that is not improving after 3 4 days.  You have a sore or wound that is not healing.  You notice a change in vision or a new problem with your vision. SEEK IMMEDIATE MEDICAL CARE IF:   Your blood glucose goes below 70, especially if you have confusion, lightheadedness, or other symptoms with it.  Your blood glucose is very high (as advised by your caregiver) twice in a row.  You pass out.  You have chest pain or trouble breathing.  You have a sudden, severe headache.  You have sudden weakness in one arm or one leg.  You have sudden difficulty speaking or swallowing.  You develop vomiting or diarrhea that is getting worse or not improving after 1 day. Document Released: 06/18/2005 Document Revised: 10/29/2011 Document Reviewed: 06/16/2008 [facility] Patient Information 2014 [place], [place].

## 2012-10-15 NOTE — Progress Notes (Signed)
  Subjective:    Patient ID: Terry Mcneil, female    DOB: [birth year], 72 y.o.   MRN: [id number]  HPI  Terry Mcneil is here today to go over her most recent lab results, get medication refills and to discuss the conditions listed below:    1) Hypertension:  She has done well on her combination of Lisinopril/HCTZ and Carvedilol.  She needs refills for both.   2)  Hyperlipidemia:  She needs a refill on her Lipitor.    3)  Bladder Incontinence:  She has done well on Detrol LA and needs a refill on it.    Review of Systems  Constitutional: Negative for activity change, fatigue and unexpected weight change.  HENT: Negative.   Eyes: Negative.   Respiratory: Negative for shortness of breath.   Cardiovascular: Negative for chest pain, palpitations and leg swelling.  Gastrointestinal: Negative for diarrhea and constipation.  Endocrine: Negative.   Genitourinary: Negative for difficulty urinating.  Musculoskeletal: Negative.   Skin: Negative.   Neurological: Negative.   Hematological: Negative for adenopathy. Does not bruise/bleed easily.  Psychiatric/Behavioral: Negative for sleep disturbance and dysphoric mood. The patient is not nervous/anxious.    Past Medical History  Diagnosis Date  . Urge incontinence   . Colon polyps   . Osteopenia 10/11/2009  . Cataract   . Hypertension   . Arthritis   . Patellar fracture     Right Knee     Family History  Problem Relation Age of Onset  . Heart disease Father        Objective:   Physical Exam  Constitutional: She appears well-nourished. No distress.  HENT:  Head: Normocephalic.  Eyes: No scleral icterus.  Neck: No thyromegaly present.  Cardiovascular: Normal rate, regular rhythm and normal heart sounds.   Pulmonary/Chest: Effort normal and breath sounds normal.  Abdominal: There is no tenderness.  Musculoskeletal: She exhibits tenderness. She exhibits no edema.  Scar on anterior surface of right knee  Neurological: [name] alert.   Skin: Skin is warm and dry.  Psychiatric: She has a normal mood and affect. Her behavior is normal. Judgment and thought content normal.          Assessment & Plan:

## 2012-10-18 ENCOUNTER — Encounter: Payer: Self-pay | Admitting: Family Medicine

## 2012-10-18 NOTE — Assessment & Plan Note (Signed)
Refilled her Lipitor

## 2012-10-18 NOTE — Assessment & Plan Note (Signed)
Refilled her Detrol LA

## 2012-10-18 NOTE — Assessment & Plan Note (Signed)
Refilled her lisinopril HCT and carvedilol

## 2013-01-14 ENCOUNTER — Other Ambulatory Visit: Payer: Self-pay

## 2013-01-14 DIAGNOSIS — Z1231 Encounter for screening mammogram for malignant neoplasm of breast: Secondary | ICD-10-CM

## 2013-02-04 ENCOUNTER — Encounter: Payer: Self-pay | Admitting: Family Medicine

## 2013-02-04 ENCOUNTER — Ambulatory Visit (INDEPENDENT_AMBULATORY_CARE_PROVIDER_SITE_OTHER): Payer: Medicare Other | Admitting: Family Medicine

## 2013-02-04 VITALS — BP 152/106 | HR 88 | Resp 16 | Ht 64.5 in | Wt 155.0 lb

## 2013-02-04 DIAGNOSIS — R5381 Other malaise: Secondary | ICD-10-CM | POA: Diagnosis not present

## 2013-02-04 DIAGNOSIS — I10 Essential (primary) hypertension: Secondary | ICD-10-CM | POA: Diagnosis not present

## 2013-02-04 DIAGNOSIS — Z0000 Encounter for general adult medical examination without abnormal findings: Secondary | ICD-10-CM | POA: Diagnosis not present

## 2013-02-04 DIAGNOSIS — Z23 Encounter for immunization: Secondary | ICD-10-CM

## 2013-02-04 MED ORDER — WELLBUTRIN XL 300 MG PO TB24: 300.0000 mg | ORAL_TABLET | ORAL | Status: DC

## 2013-02-04 MED ORDER — WELLBUTRIN XL 150 MG PO TB24: 150.0000 mg | ORAL_TABLET | ORAL | Status: DC

## 2013-02-04 NOTE — Progress Notes (Signed)
  Subjective:    Patient ID: Terry Mcneil, female    DOB: Apr 08, 1941, 72 y.o.   MRN: [id number]  HPI  Terry Mcneil is here today for her annual CPE. Overall, she feels that she is healthy.  She would like to get a flu shot today as well.    Review of Systems  Constitutional: Negative.   HENT: Negative.   Eyes: Negative.   Respiratory: Negative.   Cardiovascular: Negative.   Gastrointestinal: Negative.   Endocrine: Negative.   Genitourinary: Negative.   Musculoskeletal: Negative.   Skin: Negative.   Allergic/Immunologic: Negative.   Neurological: Negative.   Hematological: Negative.   Psychiatric/Behavioral: Negative.      Past Medical History  Diagnosis Date  . Urge incontinence   . Colon polyps   . Osteopenia 10/11/2009  . Cataract   . Hypertension   . Arthritis   . Patellar fracture     Right Knee      Family History  Problem Relation Age of Onset  . 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     Alcohol Use:  Moderate (Wine)    Drug Use:  None   Diet:  Weight Watchers    Exercise:  Gym (4 X week)    Hobbies: Reading , Gardening , Grandchildren              Objective:   Physical Exam  Nursing note and vitals reviewed. Constitutional: [name] oriented to person, place, and time. She appears well-developed and well-nourished.  HENT:  Head: Normocephalic.  Eyes: Pupils are equal, round, and reactive to light.  Neck: Normal range of motion.  Cardiovascular: Normal rate, regular rhythm and normal heart sounds.   Pulmonary/Chest: Effort normal and breath sounds normal.  Abdominal: Soft.  Genitourinary: Vagina normal.   Musculoskeletal: Normal range of motion.  Neurological: [name] alert and oriented to person, place, and time.  Skin: Skin is warm and dry.  Psychiatric: She has a normal mood and affect. Her behavior is normal. Judgment and thought content normal.      Assessment & Plan:    Terry Mcneil was seen today for annual exam.  Diagnoses and associated orders for this visit:  Routine general medical examination at a health care facility Comments: Normal exam  - POCT urinalysis dipstick  Essential hypertension, benign Comments: Her BP is elevated today.  [name] to limit her sodium intake.    Need for prophylactic vaccination and inoculation against influenza Comments: She received a flu shot without difficulty.   - Flu Vaccine QUAD 36+ mos PF IM (Fluarix)  Other malaise and fatigue Comments: [name] going to try some Wellbutrin once we get her BP down.   - WELLBUTRIN XL 150 MG 24 hr tablet; Take 1 tablet (150 mg total) by mouth every morning. - WELLBUTRIN XL 300 MG 24 hr tablet; Take 1 tablet (300 mg total) by mouth every morning.

## 2013-02-08 ENCOUNTER — Ambulatory Visit
Admission: RE | Admit: 2013-02-08 | Discharge: 2013-02-08 | Disposition: A | Discharge status: Home / Self Care | Payer: Medicare Other | Source: Ambulatory Visit

## 2013-02-08 DIAGNOSIS — Z1231 Encounter for screening mammogram for malignant neoplasm of breast: Secondary | ICD-10-CM

## 2013-02-16 ENCOUNTER — Encounter: Payer: Self-pay | Admitting: Family Medicine

## 2013-02-16 ENCOUNTER — Ambulatory Visit (INDEPENDENT_AMBULATORY_CARE_PROVIDER_SITE_OTHER): Payer: Medicare Other | Admitting: Family Medicine

## 2013-02-16 VITALS — BP 159/116 | HR 90 | Resp 16 | Ht 65.0 in | Wt 152.0 lb

## 2013-02-16 DIAGNOSIS — R32 Unspecified urinary incontinence: Secondary | ICD-10-CM | POA: Diagnosis not present

## 2013-02-16 DIAGNOSIS — N3941 Urge incontinence: Secondary | ICD-10-CM | POA: Insufficient documentation

## 2013-02-16 DIAGNOSIS — I10 Essential (primary) hypertension: Secondary | ICD-10-CM | POA: Diagnosis not present

## 2013-02-16 MED ORDER — CARVEDILOL 12.5 MG PO TABS: 12.5000 mg | ORAL_TABLET | Freq: Two times a day (BID) | ORAL | Status: DC

## 2013-02-16 MED ORDER — LISINOPRIL-HYDROCHLOROTHIAZIDE 10-12.5 MG PO TABS: 1.0000 | ORAL_TABLET | Freq: Two times a day (BID) | ORAL | Status: DC

## 2013-02-16 NOTE — Progress Notes (Addendum)
  Subjective:    Patient ID: Terry Mcneil, female    DOB: Jun 06, 1940, 72 y.o.   MRN: [id number]  HPI  Terry Mcneil is here today to discuss the conditions listed below:   1)  Hypertension:  [name] back for a recheck of her BP.  At her last visit, we discussed her increasing her lisinopril/HCT to a full tab daily in addition to Coreg 6.25 mg twice a day.  She has been monitoring her blood pressure at home has brought in a listing of 5 of her pressures.  Three out of the five pressures were normal.    2)  Mood:  She would like to get started on Wellbutrin and wonders when she can start on it.    3)      Review of Systems  Constitutional: Negative.   HENT: Negative.   Eyes: Negative.   Respiratory: Negative.   Cardiovascular: Negative.   Gastrointestinal: Negative.   Endocrine: Negative.   Genitourinary: Negative.   Musculoskeletal: Negative.   Allergic/Immunologic: Negative.   Neurological: Negative.   Hematological: Negative.   Psychiatric/Behavioral: Negative.     Past Medical History  Diagnosis Date  . Urge incontinence   . Colon polyps   . Osteopenia 10/11/2009  . Cataract   . Hypertension   . Arthritis   . Patellar fracture     Right Knee      Family History  Problem Relation Age of Onset  . 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     Alcohol Use:  Moderate (Wine)    Drug Use:  None   Diet:  Weight Watchers    Exercise:  Gym (4 X week)    Hobbies: Reading , Gardening , Grandchildren                Objective:   Physical Exam  Vitals reviewed. Constitutional: [name] oriented to person, place, and time. She appears  well-developed and well-nourished.  Pulmonary/Chest: Effort normal.  Neurological: [name] alert and oriented to person, place, and time.  Skin: Skin is warm and dry.  Psychiatric: She has a normal mood and affect.      Assessment & Plan:

## 2013-02-16 NOTE — Assessment & Plan Note (Signed)
We discussed why her BP would be increasing.  [name] not exercising as much as she used to.  [name] to increase the Coreg to 12.5 mg BID and the lisinopril/HCT to 20/12.5 BID.  If this does not get her down then we'll add some amlodipine.

## 2013-02-16 NOTE — Assessment & Plan Note (Signed)
Stable on Detrol.   °

## 2013-02-16 NOTE — Patient Instructions (Addendum)
1)  BP   Let's try increasing your Coreg to 12.5 mg twice a day and increase your lisinopril HCT to 20/12.5 twice a day.     2 Gram Low Sodium Diet A 2 gram sodium diet restricts the amount of sodium in the diet to no more than 2 g or 2000 mg daily. Limiting the amount of sodium is often used to help lower blood pressure. It is important if you have heart, liver, or kidney problems. Many foods contain sodium for flavor and sometimes as a preservative. When the amount of sodium in a diet needs to be low, it is important to know what to look for when choosing foods and drinks. The following includes some information and guidelines to help make it easier for you to adapt to a low sodium diet. QUICK TIPS  Do not add salt to food.  Avoid convenience items and fast food.  Choose unsalted snack foods.  Buy lower sodium products, often labeled as "lower sodium" or "no salt added."  Check food labels to learn how much sodium is in 1 serving.  When eating at a restaurant, ask that your food be prepared with less salt or none, if possible. READING FOOD LABELS FOR SODIUM INFORMATION The nutrition facts label is a good place to find how much sodium is in foods. Look for products with no more than 500 to 600 mg of sodium per meal and no more than 150 mg per serving. Remember that 2 g = 2000 mg. The food label may also list foods as:  Sodium-free: Less than 5 mg in a serving.  Very low sodium: 35 mg or less in a serving.  Low-sodium: 140 mg or less in a serving.  Light in sodium: 50% less sodium in a serving. For example, if a food that usually has 300 mg of sodium is changed to become light in sodium, it will have 150 mg of sodium.  Reduced sodium: 25% less sodium in a serving. For example, if a food that usually has 400 mg of sodium is changed to reduced sodium, it will have 300 mg of sodium. CHOOSING FOODS Grains  Avoid: Salted crackers and snack items. Some cereals, including instant hot  cereals. Bread stuffing and biscuit mixes. Seasoned rice or pasta mixes.  Choose: Unsalted snack items. Low-sodium cereals, oats, puffed wheat and rice, shredded wheat. English muffins and bread. Pasta. Meats  Avoid: Salted, canned, smoked, spiced, pickled meats, including fish and poultry. Bacon, ham, sausage, cold cuts, hot dogs, anchovies.  Choose: Low-sodium canned tuna and salmon. Fresh or frozen meat, poultry, and fish. Dairy  Avoid: Processed cheese and spreads. Cottage cheese. Buttermilk and condensed milk. Regular cheese.  Choose: Milk. Low-sodium cottage cheese. Yogurt. Sour cream. Low-sodium cheese. Fruits and Vegetables  Avoid: Regular canned vegetables. Regular canned tomato sauce and paste. Frozen vegetables in sauces. Olives. Rosita Fire. Relishes. Sauerkraut.  Choose: Low-sodium canned vegetables. Low-sodium tomato sauce and paste. Frozen or fresh vegetables. Fresh and frozen fruit. Condiments  Avoid: Canned and packaged gravies. Worcestershire sauce. Tartar sauce. Barbecue sauce. Soy sauce. Steak sauce. Ketchup. Onion, garlic, and table salt. Meat flavorings and tenderizers.  Choose: Fresh and dried herbs and spices. Low-sodium varieties of mustard and ketchup. Lemon juice. Tabasco sauce. Horseradish. SAMPLE 2 GRAM SODIUM MEAL PLAN Breakfast / Sodium (mg)  1 cup low-fat milk / 143 mg  2 slices whole-wheat toast / 270 mg  1 tbs heart-healthy margarine / 153 mg  1 hard-boiled egg / 139 mg  1 small orange / 0 mg Lunch / Sodium (mg)  1 cup raw carrots / 76 mg   cup hummus / 298 mg  1 cup low-fat milk / 143 mg   cup red grapes / 2 mg  1 whole-wheat pita bread / 356 mg Dinner / Sodium (mg)  1 cup whole-wheat pasta / 2 mg  1 cup low-sodium tomato sauce / 73 mg  3 oz lean ground beef / 57 mg  1 small side salad (1 cup raw spinach leaves,  cup cucumber,  cup yellow bell pepper) with 1 tsp olive oil and 1 tsp red wine vinegar / 25 mg Snack / Sodium  (mg)  1 container low-fat vanilla yogurt / 107 mg  3 graham cracker squares / 127 mg Nutrient Analysis  Calories: 2033  Protein: 77 g  Carbohydrate: 282 g  Fat: 72 g  Sodium: 1971 mg Document Released: 04/29/2005 Document Revised: 07/22/2011 Document Reviewed: 07/31/2009                                                                                                                                                                                                                                                                                                                                                                                                                                                                                                                                                                                                                                                                                                                                                                                                                                                                                                                                                                                                                                                                ,,,  [employer] Patient Information 2014 [place], [place].

## 2013-03-03 ENCOUNTER — Other Ambulatory Visit: Payer: Self-pay | Admitting: Family Medicine

## 2013-03-24 ENCOUNTER — Other Ambulatory Visit: Payer: Self-pay | Admitting: Family Medicine

## 2013-03-24 DIAGNOSIS — R32 Unspecified urinary incontinence: Secondary | ICD-10-CM

## 2013-03-24 MED ORDER — TOLTERODINE TARTRATE ER 4 MG PO CP24: 4.0000 mg | ORAL_CAPSULE | Freq: Every day | ORAL | Status: DC

## 2013-04-02 ENCOUNTER — Other Ambulatory Visit: Payer: Self-pay | Admitting: Family Medicine

## 2013-04-11 DIAGNOSIS — Z0000 Encounter for general adult medical examination without abnormal findings: Secondary | ICD-10-CM | POA: Insufficient documentation

## 2013-04-11 DIAGNOSIS — Z23 Encounter for immunization: Secondary | ICD-10-CM | POA: Insufficient documentation

## 2013-04-11 NOTE — Assessment & Plan Note (Signed)
The patient confirmed that they are not allergic to eggs and have never had a bad reaction with the flu shot in the past.  The vaccination was given without difficulty.   

## 2013-04-17 ENCOUNTER — Encounter: Payer: Self-pay | Admitting: Family Medicine

## 2013-04-17 DIAGNOSIS — R5381 Other malaise: Secondary | ICD-10-CM | POA: Insufficient documentation

## 2013-10-29 ENCOUNTER — Other Ambulatory Visit: Payer: Self-pay | Admitting: Family Medicine

## 2013-11-19 ENCOUNTER — Other Ambulatory Visit: Payer: Self-pay | Admitting: Family Medicine

## 2014-01-07 ENCOUNTER — Other Ambulatory Visit: Payer: Self-pay | Admitting: Family Medicine

## 2014-01-13 DIAGNOSIS — R55 Syncope and collapse: Secondary | ICD-10-CM | POA: Diagnosis not present

## 2014-02-07 ENCOUNTER — Ambulatory Visit (INDEPENDENT_AMBULATORY_CARE_PROVIDER_SITE_OTHER): Payer: Medicare Other | Admitting: Family

## 2014-02-07 ENCOUNTER — Encounter: Payer: Self-pay | Admitting: Family

## 2014-02-07 VITALS — BP 126/96 | HR 78 | Temp 98.1°F | Resp 18 | Ht 65.0 in | Wt 160.0 lb

## 2014-02-07 DIAGNOSIS — E785 Hyperlipidemia, unspecified: Secondary | ICD-10-CM

## 2014-02-07 DIAGNOSIS — M949 Disorder of cartilage, unspecified: Secondary | ICD-10-CM | POA: Diagnosis not present

## 2014-02-07 DIAGNOSIS — M8580 Other specified disorders of bone density and structure, unspecified site: Secondary | ICD-10-CM | POA: Insufficient documentation

## 2014-02-07 DIAGNOSIS — N3941 Urge incontinence: Secondary | ICD-10-CM | POA: Diagnosis not present

## 2014-02-07 DIAGNOSIS — M899 Disorder of bone, unspecified: Secondary | ICD-10-CM | POA: Diagnosis not present

## 2014-02-07 DIAGNOSIS — Z23 Encounter for immunization: Secondary | ICD-10-CM | POA: Diagnosis not present

## 2014-02-07 DIAGNOSIS — I10 Essential (primary) hypertension: Secondary | ICD-10-CM | POA: Diagnosis not present

## 2014-02-07 LAB — BASIC METABOLIC PANEL
BUN: 23 mg/dL (ref 6–23)
CHLORIDE: 100 meq/L (ref 96–112)
CO2: 24 meq/L (ref 19–32)
CREATININE: 0.8 mg/dL (ref 0.4–1.2)
Calcium: 9.5 mg/dL (ref 8.4–10.5)
GFR: 77.85 mL/min (ref >60.00)
Glucose, Bld: 109 mg/dL — ABNORMAL HIGH (ref 70–99)
POTASSIUM: 4.4 meq/L (ref 3.5–5.1)
SODIUM: 135 meq/L (ref 135–145)

## 2014-02-07 LAB — HEPATIC FUNCTION PANEL
ALK PHOS: 66 U/L (ref 39–117)
ALT: 17 U/L (ref 0–35)
AST: 26 U/L (ref 0–37)
Albumin: 4.4 g/dL (ref 3.5–5.2)
BILIRUBIN DIRECT: 0.1 mg/dL (ref 0.0–0.3)
BILIRUBIN TOTAL: 0.9 mg/dL (ref 0.2–1.2)
TOTAL PROTEIN: 7.3 g/dL (ref 6.0–8.3)

## 2014-02-07 MED ORDER — ATORVASTATIN CALCIUM 20 MG PO TABS: 20.0000 mg | ORAL_TABLET | Freq: Every day | ORAL | Status: DC

## 2014-02-07 MED ORDER — TOLTERODINE TARTRATE ER 4 MG PO CP24: 4.0000 mg | ORAL_CAPSULE | Freq: Every day | ORAL | Status: DC

## 2014-02-07 MED ORDER — CARVEDILOL 6.25 MG PO TABS: 6.2500 mg | ORAL_TABLET | Freq: Two times a day (BID) | ORAL | Status: DC

## 2014-02-07 MED ORDER — LISINOPRIL-HYDROCHLOROTHIAZIDE 20-25 MG PO TABS
1.0000 | ORAL_TABLET | Freq: Every day | ORAL | Status: DC
Start: 2014-02-07 — End: 2014-05-04

## 2014-02-07 NOTE — Assessment & Plan Note (Signed)
Will obtain dexa scan.

## 2014-02-07 NOTE — Patient Instructions (Signed)
Please complete lab work prior to leaving. Change coreg to 6.25mg  twice daily. Continue lisinopril-hctz once daily. Follow up in 1 month for medicare wellness visit.

## 2014-02-07 NOTE — Assessment & Plan Note (Signed)
Fair BP today. She was taking carvedilol 12mg  once daily.  Will split into 6.25mg  bid. Plan to repeat BP in 1 month.

## 2014-02-07 NOTE — Assessment & Plan Note (Signed)
Reports symptoms well controlled with detrol.

## 2014-02-07 NOTE — Progress Notes (Signed)
Subjective:    Patient ID: Terry Mcneil, female    DOB: 07/04/1940, 73 y.o.   MRN: [id number]  HPI  Terry Mcneil is a 73 yr old female who presents today to establish care.    Hyperlipidemia- she is on lipitor and has been for 10-15 years.  She denies associated myalgia.  Lab Results  Component Value Date   CHOL 168 10/08/2012   HDL 63 10/08/2012   LDLCALC 82 10/08/2012   TRIG 116 10/08/2012   CHOLHDL 2.7 10/08/2012    HTN- on carvedilol and lisinopril-hctz.  Was diagnosed late 62's with HTN.  BP Readings from Last 3 Encounters:  02/07/14 126/96  02/16/13 159/116  02/04/13 152/106   History of colon polyps- Has followed at [facility].  Follow up colo neg for polyps. Reports that she is due for follow up colo at age 74.    Osteopenia-  Due for dexa.    Overactive bladder- reports that this is well controlled with Detrol.  Review of Systems  Constitutional: Negative for unexpected weight change.  HENT: Negative for rhinorrhea.   Respiratory: Negative for cough and shortness of breath.   Cardiovascular: Negative for chest pain and leg swelling.  Gastrointestinal: Negative for nausea and diarrhea.  Musculoskeletal:       Chronic right knee pain  Skin: Negative for rash.  Neurological: Negative for headaches.  Hematological: Negative for adenopathy.  Psychiatric/Behavioral:       Denies depression/anxiety   Past Medical History  Diagnosis Date  . Urge incontinence   . Colon polyps   . Osteopenia 10/11/2009  . Cataract   . Hypertension   . Arthritis   . Patellar fracture     Right Knee     History   Social History  . Marital Status: Married    Spouse Name: Orva Riles    Number of Children: 2  . Years of Education: N/A   Occupational History  . Retired    Social History Main Topics  . Smoking status: Former Smoker    Quit date: 08/27/1978  . Smokeless tobacco: Never Used  . Alcohol Use: Yes     Comment: 3 nights/ week- 2 glasses of wine  . Drug Use: No  .  Sexual Activity: Yes   Other Topics Concern  . 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     Alcohol Use:  Moderate (Wine), 2 glasses 3 times a week   Drug Use:  None   Diet:  Weight Watchers    Exercise:  Not regular   Hobbies: Reading , Gardening , Grandchildren                Past Surgical History  Procedure Laterality Date  . Tubal ligation    . Vaginal delivery      x2 -   . Tonsillectomy    . Orif patella Right 08/28/2012    Procedure: OPEN REDUCTION INTERNAL (ORIF) FIXATION PATELLA- RIGHT;  Surgeon: Newt Minion, MD;  Location: [facility];  Service: Orthopedics;  Laterality: Right;  Open Reduction Internal Fixation Right Patella  . Eye surgery Bilateral     Cataracts  Family History  Problem Relation Age of Onset  . Heart disease Father 30    valve replacement    No Known Allergies  Current Outpatient Prescriptions on File Prior to Visit  Medication Sig Dispense Refill  . aspirin EC 81 MG tablet Take 81 mg by mouth daily.      Marland Kitchen atorvastatin (LIPITOR) 20 MG tablet Take 1 tablet (20 mg total) by mouth daily.  90 tablet  3  . calcium-vitamin D (OSCAL WITH D) 500-200 MG-UNIT per tablet Take 1 tablet by mouth 2 (two) times daily.      Marland Kitchen lisinopril-hydrochlorothiazide (PRINZIDE,ZESTORETIC) 20-25 MG per tablet Take 1 tablet by mouth daily.  90 tablet  1  . Multiple Vitamin (MULTIVITAMIN WITH MINERALS) TABS Take 1 tablet by mouth daily.      Marland Kitchen tolterodine (DETROL LA) 4 MG 24 hr capsule Take 1 capsule (4 mg total) by mouth daily.  30 capsule  11   No current facility-administered medications on file prior to visit.    BP 126/96  Pulse 78  Temp(Src) 98.1 F  (36.7 C) (Oral)  Resp 18  Ht 5\' 5"  (1.651 m)  Wt 160 lb (72.576 kg)  BMI 26.63 kg/m2  SpO2 98%       Objective:   Physical Exam  Constitutional: [name] oriented to person, place, and time. She appears well-developed and well-nourished. No distress.  HENT:  Head: Normocephalic and atraumatic.  Eyes: Pupils are equal, round, and reactive to light. No scleral icterus.  Cardiovascular: Normal rate and regular rhythm.   No murmur heard. Pulmonary/Chest: Effort normal and breath sounds normal. No respiratory distress. She has no wheezes. She has no rales. She exhibits no tenderness.  Musculoskeletal: She exhibits no edema.  Lymphadenopathy:    She has no cervical adenopathy.  Neurological: [name] alert and oriented to person, place, and time.  Skin: Skin is warm and dry.  Psychiatric: She has a normal mood and affect. Her behavior is normal. Judgment and thought content normal.          Assessment & Plan:

## 2014-02-07 NOTE — Progress Notes (Signed)
Pre visit review using our clinic review tool, if applicable. No additional management support is needed unless otherwise documented below in the visit note. 

## 2014-02-07 NOTE — Assessment & Plan Note (Signed)
Tolerating lipitor. Check lft today, plan flp next visit when fasting.

## 2014-02-10 ENCOUNTER — Encounter: Payer: Self-pay | Admitting: Family

## 2014-02-25 ENCOUNTER — Other Ambulatory Visit: Payer: Self-pay | Admitting: Family

## 2014-02-25 DIAGNOSIS — Z1231 Encounter for screening mammogram for malignant neoplasm of breast: Secondary | ICD-10-CM

## 2014-02-28 ENCOUNTER — Ambulatory Visit (HOSPITAL_BASED_OUTPATIENT_CLINIC_OR_DEPARTMENT_OTHER)
Admission: RE | Admit: 2014-02-28 | Discharge: 2014-02-28 | Disposition: A | Discharge status: Home / Self Care | Payer: Medicare Other | Source: Ambulatory Visit | Attending: Family | Admitting: Family

## 2014-02-28 DIAGNOSIS — Z1231 Encounter for screening mammogram for malignant neoplasm of breast: Secondary | ICD-10-CM | POA: Diagnosis not present

## 2014-03-02 ENCOUNTER — Other Ambulatory Visit: Payer: Self-pay | Admitting: Family

## 2014-03-02 DIAGNOSIS — R928 Other abnormal and inconclusive findings on diagnostic imaging of breast: Secondary | ICD-10-CM

## 2014-03-09 ENCOUNTER — Encounter: Payer: Self-pay | Admitting: Family

## 2014-03-09 ENCOUNTER — Ambulatory Visit (INDEPENDENT_AMBULATORY_CARE_PROVIDER_SITE_OTHER): Payer: Medicare Other | Admitting: Family

## 2014-03-09 VITALS — BP 130/78 | HR 65 | Temp 98.1°F | Resp 16 | Ht 65.0 in | Wt 157.0 lb

## 2014-03-09 DIAGNOSIS — E785 Hyperlipidemia, unspecified: Secondary | ICD-10-CM

## 2014-03-09 DIAGNOSIS — Z23 Encounter for immunization: Secondary | ICD-10-CM

## 2014-03-09 DIAGNOSIS — Z0000 Encounter for general adult medical examination without abnormal findings: Secondary | ICD-10-CM

## 2014-03-09 DIAGNOSIS — R739 Hyperglycemia, unspecified: Secondary | ICD-10-CM

## 2014-03-09 LAB — LIPID PANEL
CHOL/HDL RATIO: 2
Cholesterol: 149 mg/dL (ref 0–200)
HDL: 62.1 mg/dL (ref >39.00)
LDL Cholesterol: 68 mg/dL (ref 0–99)
NONHDL: 86.9
Triglycerides: 95 mg/dL (ref 0.0–149.0)
VLDL: 19 mg/dL (ref 0.0–40.0)

## 2014-03-09 LAB — HEMOGLOBIN A1C: Hgb A1c MFr Bld: 5.6 % (ref 4.6–6.5)

## 2014-03-09 NOTE — Progress Notes (Signed)
Pre visit review using our clinic review tool, if applicable. No additional management support is needed unless otherwise documented below in the visit note. 

## 2014-03-09 NOTE — Patient Instructions (Signed)
Please complete lab work prior to leaving.  Follow up in 3 months, sooner if problems/concerns.

## 2014-03-09 NOTE — Progress Notes (Signed)
Patient ID: Terry Mcneil, female   DOB: 08/14/1940, 73 y.o.   MRN: [id number] Subjective:   Patient here for Medicare annual wellness visit and management of other chronic and acute problems.  HTN-  Denies CP/SOB or swelling BP Readings from Last 3 Encounters:  03/09/14 130/78  02/07/14 126/96  02/16/13 159/116   Overactive Bladder- stable on detrol  Hyperlipidemia-  Lab Results  Component Value Date   CHOL 168 10/08/2012   HDL 63 10/08/2012   LDLCALC 82 10/08/2012   TRIG 116 10/08/2012   CHOLHDL 2.7 10/08/2012    Immunizations: notes prevnar 2009, flu up to date. Td up to date Diet: reports healthy diet, has lost 3 pounds Exercise: regular bike at [place] Colonoscopy: due 2016 Dexa: scheduled Pap Smear: NA Mammogram: completed, reports that the [facility] center has recalled her  Advanced Directives:  Reports advanced directive in place.   Risk factors: at risk for CAD due to hyperlipidemia   Roster of Physicians Providing Medical Care to Patient: None  Activities of Daily Living  In your present state of health, do you have any difficulty performing the following activities? Preparing food and eating?: No  Bathing yourself: No  Getting dressed: No  Using the toilet:No  Moving around from place to place: No  In the past year have you fallen or had a near fall?:No     Home Safety: Has smoke detector and wears seat belts. No firearms. No excess sun exposure.  Diet and Exercise  Current exercise habits: yes regular exercise Dietary issues discussed: healthy diet   Depression Screen  (Note: if answer to either of the following is "Yes", then a more complete depression screening is indicated)  Q1: Over the past two weeks, have you felt down, depressed or hopeless?no  Q2: Over the past two weeks, have you felt little interest or pleasure in doing things? no   The following portions of the patient's history were reviewed and updated as appropriate: allergies, current medications,  past family history, past medical history, past social history, past surgical history and problem list.     Objective:   Vision: see nursing Hearing: able to hear forced whisper at 6 feet Body mass index: Body mass index is 26.13 kg/(m^2). Cognitive Impairment Assessment: cognition, memory and judgment appear normal.   Physical Exam  Constitutional: [name] oriented to person, place, and time. She appears well-developed and well-nourished. No distress.  HENT:  Head: Normocephalic and atraumatic.  Right Ear: Tympanic membrane and ear canal normal.  Left Ear: Tympanic membrane and ear canal normal.  Mouth/Throat: Oropharynx is clear and moist.  Eyes: Pupils are equal, round, and reactive to light. No scleral icterus.  Neck: Normal range of motion. No thyromegaly present.  Cardiovascular: Normal rate and regular rhythm.   No murmur heard. Pulmonary/Chest: Effort normal and breath sounds normal. No respiratory distress. He has no wheezes. She has no rales. She exhibits no tenderness.  Abdominal: Soft. Bowel sounds are normal. He exhibits no distension and no mass. There is no tenderness. There is no rebound and no guarding.  Musculoskeletal: She exhibits no edema.  Lymphadenopathy:    She has no cervical adenopathy.  Neurological: [name] alert and oriented to person, place, and time.  She exhibits normal muscle tone. Coordination normal.  Skin: Skin is warm and dry.  Psychiatric: She has a normal mood and affect. Her behavior is normal. Judgment and thought content normal.  Breasts: Examined lying Right: Without masses, retractions, discharge or axillary  adenopathy.  Left: Without masses, retractions, discharge or axillary adenopathy.           Assessment & Plan:    Assessment:   Medicare wellness utd on preventive parameters     Plan:    During the course of the visit the patient was educated and counseled about appropriate screening and preventive services including:         Diagnostic mammography  Bone densitometry screening  Diabetes screening    Vaccines / LABS  Pneumovax today Patient Instructions (the written plan) was given to the patient.

## 2014-03-10 ENCOUNTER — Encounter: Payer: Self-pay | Admitting: Family

## 2014-03-11 ENCOUNTER — Telehealth: Payer: Self-pay | Admitting: *Deleted

## 2014-03-11 ENCOUNTER — Ambulatory Visit
Admission: RE | Admit: 2014-03-11 | Discharge: 2014-03-11 | Disposition: A | Discharge status: Home / Self Care | Payer: Medicare Other | Source: Ambulatory Visit | Attending: Family | Admitting: Family

## 2014-03-11 ENCOUNTER — Other Ambulatory Visit: Payer: Self-pay

## 2014-03-11 DIAGNOSIS — M8580 Other specified disorders of bone density and structure, unspecified site: Secondary | ICD-10-CM

## 2014-03-11 DIAGNOSIS — Z780 Asymptomatic menopausal state: Secondary | ICD-10-CM

## 2014-03-11 DIAGNOSIS — Z13820 Encounter for screening for osteoporosis: Secondary | ICD-10-CM

## 2014-03-11 NOTE — Telephone Encounter (Signed)
Melissa-- I tried to reorder DEXA for [facility] but it will not let me use osteopenia. What dx should I use?

## 2014-03-11 NOTE — Telephone Encounter (Signed)
Received call from [facility] re: pt's DEXA scheduled for today. She states pt told them her last DEXA was at [facility]  (2011) and they would prefer that pt have repeat DEXA there so comparison studies can be made. Pt is agreeable to proceed at [facility].   Delsa Sale-- can you arrange appt and contact pt with new date and time? Can you use DEXA order in system now or does new one have to be entered?

## 2014-03-11 NOTE — Telephone Encounter (Signed)
A new order will be needed

## 2014-03-15 ENCOUNTER — Ambulatory Visit
Admission: RE | Admit: 2014-03-15 | Discharge: 2014-03-15 | Disposition: A | Discharge status: Home / Self Care | Payer: Medicare Other | Source: Ambulatory Visit | Attending: Family | Admitting: Family

## 2014-03-15 DIAGNOSIS — R928 Other abnormal and inconclusive findings on diagnostic imaging of breast: Secondary | ICD-10-CM

## 2014-03-15 DIAGNOSIS — N6489 Other specified disorders of breast: Secondary | ICD-10-CM | POA: Diagnosis not present

## 2014-05-04 ENCOUNTER — Other Ambulatory Visit: Payer: Self-pay | Admitting: Family

## 2014-05-05 NOTE — Telephone Encounter (Signed)
Lisinopril-HCTZ refilled per protocol. JG//CMA 

## 2014-05-22 ENCOUNTER — Other Ambulatory Visit: Payer: Self-pay | Admitting: Family

## 2014-06-10 ENCOUNTER — Encounter: Payer: Self-pay | Admitting: Family

## 2014-06-10 ENCOUNTER — Ambulatory Visit (INDEPENDENT_AMBULATORY_CARE_PROVIDER_SITE_OTHER): Payer: Medicare Other | Admitting: Family

## 2014-06-10 VITALS — BP 118/78 | HR 72 | Temp 97.8°F | Resp 16 | Ht 65.0 in | Wt 157.8 lb

## 2014-06-10 DIAGNOSIS — N3281 Overactive bladder: Secondary | ICD-10-CM

## 2014-06-10 DIAGNOSIS — M545 Low back pain, unspecified: Secondary | ICD-10-CM | POA: Insufficient documentation

## 2014-06-10 DIAGNOSIS — M8580 Other specified disorders of bone density and structure, unspecified site: Secondary | ICD-10-CM

## 2014-06-10 DIAGNOSIS — I10 Essential (primary) hypertension: Secondary | ICD-10-CM

## 2014-06-10 LAB — BASIC METABOLIC PANEL
BUN: 35 mg/dL — AB (ref 6–23)
CALCIUM: 9.6 mg/dL (ref 8.4–10.5)
CO2: 30 mEq/L (ref 19–32)
Chloride: 101 mEq/L (ref 96–112)
Creatinine, Ser: 0.86 mg/dL (ref 0.40–1.20)
GFR: 68.47 mL/min (ref >60.00)
Glucose, Bld: 103 mg/dL — ABNORMAL HIGH (ref 70–99)
POTASSIUM: 4.3 meq/L (ref 3.5–5.1)
SODIUM: 136 meq/L (ref 135–145)

## 2014-06-10 MED ORDER — CYCLOBENZAPRINE HCL 5 MG PO TABS: 5.0000 mg | ORAL_TABLET | Freq: Three times a day (TID) | ORAL | Status: DC | PRN

## 2014-06-10 NOTE — Assessment & Plan Note (Signed)
Stable on Detrol. Continue.

## 2014-06-10 NOTE — Assessment & Plan Note (Signed)
Lipids at goal. Continue atorvastatin.  °

## 2014-06-10 NOTE — Progress Notes (Signed)
Subjective:    Patient ID: Terry Mcneil, female    DOB: [birth year], 74 y.o.   MRN: [id number]  HPI Terry Mcneil is here today for follow up.  1. HYPERTENSION: Reports compliance with carvedilol and lisinopril/hctz. Denies the following associated symptoms: Chest pain, dyspnea, blurred vision, headache, or lower extremity edema.  BP Readings from Last 3 Encounters:  06/10/14 118/78  03/09/14 130/78  02/07/14 126/96   2. Hyperlipidemia: Reports compliance with atorvastatin. Denies myalgia. Lab Results  Component Value Date   CHOL 149 03/09/2014   HDL 62.10 03/09/2014   LDLCALC 68 03/09/2014   TRIG 95.0 03/09/2014   CHOLHDL 2 03/09/2014   3. Overactive bladder: Takes detrol daily-works well for her.  4. Reports spasms in lower back that started a week ago. Had spasms 3 mornings last week. Starts when she gets out of bed-walking worsens it. Denies pain radiation, numbness, tingling in legs. Aleve and rest relieve pain. Had this many years ago. Used muscle relaxant in past and it seemed to help.  Review of Systems  Respiratory: Negative for shortness of breath.   Cardiovascular: Negative for chest pain and leg swelling.   Past Medical History  Diagnosis Date  . Urge incontinence   . Colon polyps   . Osteopenia 10/11/2009  . Cataract   . Hypertension   . Arthritis   . Patellar fracture     Right Knee     History   Social History  . Marital Status: Married    Spouse Name: Terry Mcneil    Number of Children: 2  . Years of Education: N/A   Occupational History  . Retired    Social History Main Topics  . Smoking status: Former Smoker    Quit date: 08/27/1978  . Smokeless tobacco: Never Used  . Alcohol Use: Yes     Comment: 3 nights/ week- 2 glasses of wine  . Drug Use: No  . Sexual Activity: Yes   Other Topics Concern  . 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     Alcohol Use:  Moderate (Wine), 2 glasses 3 times a week   Drug Use:  None   Diet:  Weight Watchers    Exercise:  Not regular   Hobbies: Reading , Gardening , Grandchildren                Past Surgical History  Procedure Laterality Date  . Tubal ligation    . Vaginal delivery      x2 -   . Tonsillectomy    . Orif patella Right 08/28/2012    Procedure: OPEN REDUCTION INTERNAL (ORIF) FIXATION PATELLA- RIGHT;  Surgeon: Newt Minion, MD;  Location: [facility];  Service: Orthopedics;  Laterality: Right;  Open Reduction Internal Fixation Right Patella  . Eye surgery Bilateral     Cataracts     Family History  Problem Relation Age of Onset  . Heart disease Father 16    valve replacement    No Known Allergies  Current Outpatient Prescriptions on File Prior to Visit  Medication Sig Dispense Refill  . aspirin EC 81 MG tablet Take 81 mg  by mouth daily.    Marland Kitchen atorvastatin (LIPITOR) 20 MG tablet Take 1 tablet (20 mg total) by mouth daily. 90 tablet 1  . calcium-vitamin D (OSCAL WITH D) 500-200 MG-UNIT per tablet Take 1 tablet by mouth 2 (two) times daily.    . carvedilol (COREG) 6.25 MG tablet TAKE 1 TABLET (6.25 MG TOTAL) BY MOUTH 2 (TWO) TIMES DAILY WITH A MEAL. 60 tablet 2  . lisinopril-hydrochlorothiazide (PRINZIDE,ZESTORETIC) 20-25 MG per tablet TAKE 1 TABLET BY MOUTH DAILY. 90 tablet 0  . Multiple Vitamin (MULTIVITAMIN WITH MINERALS) TABS Take 1 tablet by mouth daily.    Marland Kitchen tolterodine (DETROL LA) 4 MG 24 hr capsule Take 1 capsule (4 mg total) by mouth daily. 90 capsule 1   No current facility-administered medications on file prior to visit.    BP 118/78 mmHg  Pulse 72  Temp(Src) 97.8 F (36.6 C) (Oral)  Resp 16  Ht 5\' 5"  (1.651 m)  Wt 157 lb 12.8 oz (71.578 kg)   BMI 26.26 kg/m2  SpO2 98%       Objective:   Physical Exam  Constitutional: [name] oriented to person, place, and time. She appears well-developed and well-nourished. No distress.  Cardiovascular: Normal rate, regular rhythm and normal heart sounds.  Exam reveals no gallop and no friction rub.   No murmur heard. Pulmonary/Chest: Effort normal and breath sounds normal. No respiratory distress. She has no wheezes. She has no rales.  Musculoskeletal: She exhibits no edema.  Lymphadenopathy:    She has no cervical adenopathy.  Neurological: [name] alert and oriented to person, place, and time. She has normal strength. No sensory deficit. Gait normal.  Reflex Scores:      Patellar reflexes are 2+ on the right side and 2+ on the left side. Strength 5/5 lower extremities. Negative straight leg raise bilaterally.  Skin: Skin is warm and dry. [name] not diaphoretic.  Psychiatric: She has a normal mood and affect. Her behavior is normal. Judgment and thought content normal.          Assessment & Plan:  Patient seen along with [facility] NP-student.  I have personally seen and examined patient and agree with Terry Mcneil's assessment and plan- [name]

## 2014-06-10 NOTE — Progress Notes (Signed)
Pre visit review using our clinic review tool, if applicable. No additional management support is needed unless otherwise documented below in the visit note. 

## 2014-06-10 NOTE — Assessment & Plan Note (Signed)
Obtain BMD.

## 2014-06-10 NOTE — Assessment & Plan Note (Signed)
Stable on carvedilol and lisinopril/hctz. Obtain BMET today.

## 2014-06-10 NOTE — Assessment & Plan Note (Signed)
Rx for flexeril as needed short term with aleve for low back pain. Instructed to call if symptoms worsen or if not improved in 1-2 weeks.

## 2014-06-10 NOTE — Patient Instructions (Addendum)
You may use flexeril as needed short term with aleve for low back pain. Call if symptoms worsen or if not improved in 1-2 weeks. Complete the lab work prior to leaving.   Follow up in 6 months.

## 2014-06-12 ENCOUNTER — Encounter: Payer: Self-pay | Admitting: Family

## 2014-06-20 ENCOUNTER — Other Ambulatory Visit: Payer: Self-pay | Admitting: Family

## 2014-07-14 DIAGNOSIS — M5431 Sciatica, right side: Secondary | ICD-10-CM | POA: Diagnosis not present

## 2014-08-01 ENCOUNTER — Other Ambulatory Visit: Payer: Self-pay | Admitting: Orthopedic Surgery

## 2014-08-01 DIAGNOSIS — M545 Low back pain: Secondary | ICD-10-CM

## 2014-08-03 ENCOUNTER — Other Ambulatory Visit: Payer: Self-pay | Admitting: Family

## 2014-08-12 ENCOUNTER — Ambulatory Visit
Admission: RE | Admit: 2014-08-12 | Discharge: 2014-08-12 | Disposition: A | Discharge status: Home / Self Care | Payer: Medicare Other | Source: Ambulatory Visit | Attending: Orthopedic Surgery | Admitting: Orthopedic Surgery

## 2014-08-12 DIAGNOSIS — M545 Low back pain: Secondary | ICD-10-CM

## 2014-08-12 DIAGNOSIS — M5137 Other intervertebral disc degeneration, lumbosacral region: Secondary | ICD-10-CM | POA: Diagnosis not present

## 2014-08-12 DIAGNOSIS — M4186 Other forms of scoliosis, lumbar region: Secondary | ICD-10-CM | POA: Diagnosis not present

## 2014-08-12 DIAGNOSIS — M47817 Spondylosis without myelopathy or radiculopathy, lumbosacral region: Secondary | ICD-10-CM | POA: Diagnosis not present

## 2014-08-15 DIAGNOSIS — M5431 Sciatica, right side: Secondary | ICD-10-CM | POA: Diagnosis not present

## 2014-08-25 DIAGNOSIS — M4726 Other spondylosis with radiculopathy, lumbar region: Secondary | ICD-10-CM | POA: Diagnosis not present

## 2014-09-15 DIAGNOSIS — M5116 Intervertebral disc disorders with radiculopathy, lumbar region: Secondary | ICD-10-CM | POA: Diagnosis not present

## 2014-09-15 DIAGNOSIS — M5416 Radiculopathy, lumbar region: Secondary | ICD-10-CM | POA: Diagnosis not present

## 2014-09-16 ENCOUNTER — Other Ambulatory Visit: Payer: Self-pay | Admitting: Family

## 2014-10-04 DIAGNOSIS — M4806 Spinal stenosis, lumbar region: Secondary | ICD-10-CM | POA: Diagnosis not present

## 2014-10-04 DIAGNOSIS — M47816 Spondylosis without myelopathy or radiculopathy, lumbar region: Secondary | ICD-10-CM | POA: Diagnosis not present

## 2014-10-04 DIAGNOSIS — M545 Low back pain: Secondary | ICD-10-CM | POA: Diagnosis not present

## 2014-10-13 DIAGNOSIS — M545 Low back pain: Secondary | ICD-10-CM | POA: Diagnosis not present

## 2014-10-13 DIAGNOSIS — M5416 Radiculopathy, lumbar region: Secondary | ICD-10-CM | POA: Diagnosis not present

## 2014-10-13 DIAGNOSIS — M47816 Spondylosis without myelopathy or radiculopathy, lumbar region: Secondary | ICD-10-CM | POA: Diagnosis not present

## 2014-11-14 ENCOUNTER — Other Ambulatory Visit: Payer: Self-pay | Admitting: Family

## 2014-12-09 ENCOUNTER — Ambulatory Visit (INDEPENDENT_AMBULATORY_CARE_PROVIDER_SITE_OTHER): Payer: Medicare Other | Admitting: Family

## 2014-12-09 ENCOUNTER — Encounter: Payer: Self-pay | Admitting: Family

## 2014-12-09 VITALS — BP 128/82 | HR 77 | Temp 98.2°F | Resp 18 | Ht 65.0 in | Wt 154.8 lb

## 2014-12-09 DIAGNOSIS — E785 Hyperlipidemia, unspecified: Secondary | ICD-10-CM | POA: Diagnosis not present

## 2014-12-09 DIAGNOSIS — M545 Low back pain, unspecified: Secondary | ICD-10-CM

## 2014-12-09 DIAGNOSIS — I10 Essential (primary) hypertension: Secondary | ICD-10-CM

## 2014-12-09 DIAGNOSIS — N3281 Overactive bladder: Secondary | ICD-10-CM | POA: Diagnosis not present

## 2014-12-09 LAB — BASIC METABOLIC PANEL
BUN: 29 mg/dL — ABNORMAL HIGH (ref 6–23)
CHLORIDE: 99 meq/L (ref 96–112)
CO2: 30 mEq/L (ref 19–32)
Calcium: 10.2 mg/dL (ref 8.4–10.5)
Creatinine, Ser: 0.94 mg/dL (ref 0.40–1.20)
GFR: 61.7 mL/min (ref >60.00)
GLUCOSE: 117 mg/dL — AB (ref 70–99)
POTASSIUM: 3.9 meq/L (ref 3.5–5.1)
Sodium: 138 mEq/L (ref 135–145)

## 2014-12-09 LAB — LIPID PANEL
CHOLESTEROL: 164 mg/dL (ref 0–200)
HDL: 72.2 mg/dL (ref >39.00)
LDL CALC: 74 mg/dL (ref 0–99)
NonHDL: 91.97
Total CHOL/HDL Ratio: 2
Triglycerides: 92 mg/dL (ref 0.0–149.0)
VLDL: 18.4 mg/dL (ref 0.0–40.0)

## 2014-12-09 NOTE — Assessment & Plan Note (Signed)
Agree with referral to neurosurgery.

## 2014-12-09 NOTE — Progress Notes (Signed)
Pre visit review using our clinic review tool, if applicable. No additional management support is needed unless otherwise documented below in the visit note. 

## 2014-12-09 NOTE — Patient Instructions (Signed)
Please complete lab work prior to leaving. Follow up in November for a wellness visit.

## 2014-12-09 NOTE — Assessment & Plan Note (Signed)
Stable on statin. Continue same. Obtain FLP

## 2014-12-09 NOTE — Progress Notes (Signed)
Subjective:    Patient ID: Terry Mcneil, female    DOB: [birth year], 74 y.o.   MRN: [id number]  HPI   Terry Mcneil is a 74 yr old female who presents today for follow up.  Patient presents today for follow up of multiple medical problems.  Chronic low back pain- has apt with neurosurgery- Dr. Christoper Fabian in [place]. April per Dr. Sharol Given reviewed- severe DDD/Spinal stenosis, severe dextroconvex lumbar rotoscoliosis.Reports that she has followed with pain clinic (Dr. Ernestina Patches).  Was told stenosis, bulging discs.   Overactive bladder- currently maintained on detrol LA. Reports well controlled.   Hyperlipidemia  Patient is currently maintained on the following medication for hyperlipidemia: atorvastatin Last lipid panel as follows:  Lab Results  Component Value Date   CHOL 149 03/09/2014   HDL 62.10 03/09/2014   LDLCALC 68 03/09/2014   TRIG 95.0 03/09/2014   CHOLHDL 2 03/09/2014   Patient denies myalgia. Patient reports good compliance with low fat/low cholesterol diet.   Hypertension  Patient is currently maintained on the following medications for blood pressure: lisinopril-hctz.   Patient reports good compliance with blood pressure medications. Patient denies chest pain, shortness of breath or swelling. Last 3 blood pressure readings in our office are as follows: BP Readings from Last 3 Encounters:  12/09/14 128/82  06/10/14 118/78  03/09/14 130/78     Review of Systems See HPI  Past Medical History  Diagnosis Date  . Urge incontinence   . Colon polyps   . Osteopenia 10/11/2009  . Cataract   . Hypertension   . Arthritis   . Patellar fracture     Right Knee     History   Social History  . Marital Status: Married    Spouse Name: Minette Manders  . Number of Children: 2  . Years of Education: N/A   Occupational History  . Retired    Social History Main Topics  . Smoking status: Former Smoker    Quit date: 08/27/1978  . Smokeless tobacco: Never Used  . Alcohol Use:  Yes     Comment: 3 nights/ week- 2 glasses of wine  . Drug Use: No  . Sexual Activity: Yes   Other Topics Concern  . 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     Alcohol Use:  Moderate (Wine), 2 glasses 3 times a week   Drug Use:  None   Diet:  Weight Watchers    Exercise:  Not regular   Hobbies: Reading , Gardening , Grandchildren                Past Surgical History  Procedure Laterality Date  . Tubal ligation    . Vaginal delivery      x2 -   . Tonsillectomy    . Orif patella Right 08/28/2012    Procedure: OPEN REDUCTION INTERNAL (ORIF) FIXATION PATELLA- RIGHT;  Surgeon: Newt Minion, MD;  Location: [facility];  Service: Orthopedics;  Laterality: Right;  Open Reduction Internal Fixation Right Patella  . Eye surgery Bilateral     Cataracts     Family History  Problem Relation Age of Onset  . Heart  disease Father 17    valve replacement    No Known Allergies  Current Outpatient Prescriptions on File Prior to Visit  Medication Sig Dispense Refill  . aspirin EC 81 MG tablet Take 81 mg by mouth daily.    Marland Kitchen atorvastatin (LIPITOR) 20 MG tablet TAKE 1 TABLET (20 MG TOTAL) BY MOUTH DAILY. (Patient taking differently: Take 1/2 tablet by mouth daily) 30 tablet 5  . calcium-vitamin D (OSCAL WITH D) 500-200 MG-UNIT per tablet Take 1 tablet by mouth 2 (two) times daily.    . carvedilol (COREG) 6.25 MG tablet TAKE 1 TABLET (6.25 MG TOTAL) BY MOUTH 2 (TWO) TIMES DAILY WITH A MEAL. (Patient taking differently: Take 1/2 tablet twice a day) 60 tablet 2  . cyclobenzaprine (FLEXERIL) 5 MG tablet Take 1 tablet (5 mg total) by mouth 3 (three) times daily as needed for muscle spasms. 20  tablet 0  . lisinopril-hydrochlorothiazide (PRINZIDE,ZESTORETIC) 20-25 MG per tablet TAKE 1 TABLET BY MOUTH DAILY. 90 tablet 1  . Multiple Vitamin (MULTIVITAMIN WITH MINERALS) TABS Take 1 tablet by mouth daily.    Marland Kitchen tolterodine (DETROL LA) 4 MG 24 hr capsule TAKE 1 CAPSULE (4 MG TOTAL) BY MOUTH DAILY. 30 capsule 1   No current facility-administered medications on file prior to visit.    BP 128/82 mmHg  Pulse 77  Temp(Src) 98.2 F (36.8 C) (Oral)  Resp 18  Ht 5\' 5"  (1.651 m)  Wt 154 lb 12.8 oz (70.217 kg)  BMI 25.76 kg/m2  SpO2 97%       Objective:   Physical Exam  Constitutional: She appears well-developed and well-nourished.  HENT:  Head: Normocephalic and atraumatic.  Cardiovascular: Normal rate, regular rhythm and normal heart sounds.   No murmur heard. Pulmonary/Chest: Effort normal and breath sounds normal. No respiratory distress. She has no wheezes.  Musculoskeletal: She exhibits no edema.  Psychiatric: She has a normal mood and affect. Her behavior is normal. Judgment and thought content normal.          Assessment & Plan:

## 2014-12-09 NOTE — Assessment & Plan Note (Signed)
Stable on detrol LA, continue same.

## 2014-12-09 NOTE — Assessment & Plan Note (Signed)
Stable, continue current meds. Obtain bmet.

## 2014-12-11 ENCOUNTER — Encounter: Payer: Self-pay | Admitting: Family

## 2014-12-17 ENCOUNTER — Other Ambulatory Visit: Payer: Self-pay | Admitting: Family

## 2015-01-27 DIAGNOSIS — M4150 Other secondary scoliosis, site unspecified: Secondary | ICD-10-CM | POA: Diagnosis not present

## 2015-01-27 DIAGNOSIS — M47816 Spondylosis without myelopathy or radiculopathy, lumbar region: Secondary | ICD-10-CM | POA: Diagnosis not present

## 2015-01-27 DIAGNOSIS — M5441 Lumbago with sciatica, right side: Secondary | ICD-10-CM | POA: Diagnosis not present

## 2015-01-27 DIAGNOSIS — M419 Scoliosis, unspecified: Secondary | ICD-10-CM | POA: Diagnosis not present

## 2015-01-27 DIAGNOSIS — M47819 Spondylosis without myelopathy or radiculopathy, site unspecified: Secondary | ICD-10-CM | POA: Diagnosis not present

## 2015-02-17 ENCOUNTER — Other Ambulatory Visit: Payer: Self-pay | Admitting: Family

## 2015-02-17 DIAGNOSIS — M419 Scoliosis, unspecified: Secondary | ICD-10-CM | POA: Diagnosis not present

## 2015-02-17 DIAGNOSIS — M9983 Other biomechanical lesions of lumbar region: Secondary | ICD-10-CM | POA: Diagnosis not present

## 2015-02-17 DIAGNOSIS — M545 Low back pain: Secondary | ICD-10-CM | POA: Diagnosis not present

## 2015-02-21 DIAGNOSIS — M545 Low back pain: Secondary | ICD-10-CM | POA: Diagnosis not present

## 2015-02-21 DIAGNOSIS — M9983 Other biomechanical lesions of lumbar region: Secondary | ICD-10-CM | POA: Diagnosis not present

## 2015-02-21 DIAGNOSIS — M419 Scoliosis, unspecified: Secondary | ICD-10-CM | POA: Diagnosis not present

## 2015-02-24 DIAGNOSIS — M9983 Other biomechanical lesions of lumbar region: Secondary | ICD-10-CM | POA: Diagnosis not present

## 2015-02-24 DIAGNOSIS — M545 Low back pain: Secondary | ICD-10-CM | POA: Diagnosis not present

## 2015-02-24 DIAGNOSIS — M419 Scoliosis, unspecified: Secondary | ICD-10-CM | POA: Diagnosis not present

## 2015-02-28 DIAGNOSIS — M419 Scoliosis, unspecified: Secondary | ICD-10-CM | POA: Diagnosis not present

## 2015-02-28 DIAGNOSIS — M9983 Other biomechanical lesions of lumbar region: Secondary | ICD-10-CM | POA: Diagnosis not present

## 2015-02-28 DIAGNOSIS — M545 Low back pain: Secondary | ICD-10-CM | POA: Diagnosis not present

## 2015-03-03 DIAGNOSIS — M9983 Other biomechanical lesions of lumbar region: Secondary | ICD-10-CM | POA: Diagnosis not present

## 2015-03-03 DIAGNOSIS — M545 Low back pain: Secondary | ICD-10-CM | POA: Diagnosis not present

## 2015-03-03 DIAGNOSIS — M419 Scoliosis, unspecified: Secondary | ICD-10-CM | POA: Diagnosis not present

## 2015-03-07 DIAGNOSIS — M419 Scoliosis, unspecified: Secondary | ICD-10-CM | POA: Diagnosis not present

## 2015-03-07 DIAGNOSIS — M9983 Other biomechanical lesions of lumbar region: Secondary | ICD-10-CM | POA: Diagnosis not present

## 2015-03-07 DIAGNOSIS — M545 Low back pain: Secondary | ICD-10-CM | POA: Diagnosis not present

## 2015-03-10 DIAGNOSIS — M9983 Other biomechanical lesions of lumbar region: Secondary | ICD-10-CM | POA: Diagnosis not present

## 2015-03-10 DIAGNOSIS — M419 Scoliosis, unspecified: Secondary | ICD-10-CM | POA: Diagnosis not present

## 2015-03-10 DIAGNOSIS — M545 Low back pain: Secondary | ICD-10-CM | POA: Diagnosis not present

## 2015-03-13 ENCOUNTER — Telehealth: Payer: Self-pay | Admitting: Behavioral Health

## 2015-03-13 NOTE — Telephone Encounter (Signed)
Spoke with the patient and confirmed appointment for 03/14/15 at 10:00 AM.

## 2015-03-14 ENCOUNTER — Ambulatory Visit (INDEPENDENT_AMBULATORY_CARE_PROVIDER_SITE_OTHER): Payer: Medicare Other | Admitting: Family

## 2015-03-14 ENCOUNTER — Encounter: Payer: Self-pay | Admitting: Family

## 2015-03-14 VITALS — BP 113/61 | HR 71 | Temp 98.1°F | Resp 16 | Ht 65.0 in | Wt 154.6 lb

## 2015-03-14 DIAGNOSIS — Z23 Encounter for immunization: Secondary | ICD-10-CM

## 2015-03-14 DIAGNOSIS — I10 Essential (primary) hypertension: Secondary | ICD-10-CM

## 2015-03-14 DIAGNOSIS — R739 Hyperglycemia, unspecified: Secondary | ICD-10-CM

## 2015-03-14 DIAGNOSIS — Z1211 Encounter for screening for malignant neoplasm of colon: Secondary | ICD-10-CM

## 2015-03-14 DIAGNOSIS — E2839 Other primary ovarian failure: Secondary | ICD-10-CM

## 2015-03-14 DIAGNOSIS — M9983 Other biomechanical lesions of lumbar region: Secondary | ICD-10-CM | POA: Diagnosis not present

## 2015-03-14 DIAGNOSIS — N3281 Overactive bladder: Secondary | ICD-10-CM

## 2015-03-14 DIAGNOSIS — E785 Hyperlipidemia, unspecified: Secondary | ICD-10-CM

## 2015-03-14 DIAGNOSIS — Z0000 Encounter for general adult medical examination without abnormal findings: Secondary | ICD-10-CM

## 2015-03-14 DIAGNOSIS — M545 Low back pain: Secondary | ICD-10-CM | POA: Diagnosis not present

## 2015-03-14 DIAGNOSIS — M419 Scoliosis, unspecified: Secondary | ICD-10-CM | POA: Diagnosis not present

## 2015-03-14 LAB — BASIC METABOLIC PANEL
BUN: 32 mg/dL — ABNORMAL HIGH (ref 6–23)
CHLORIDE: 99 meq/L (ref 96–112)
CO2: 30 meq/L (ref 19–32)
CREATININE: 1.12 mg/dL (ref 0.40–1.20)
Calcium: 10.1 mg/dL (ref 8.4–10.5)
GFR: 50.37 mL/min — ABNORMAL LOW (ref >60.00)
GLUCOSE: 103 mg/dL — AB (ref 70–99)
Potassium: 3.7 mEq/L (ref 3.5–5.1)
Sodium: 138 mEq/L (ref 135–145)

## 2015-03-14 LAB — HEMOGLOBIN A1C: HEMOGLOBIN A1C: 5.5 % (ref 4.6–6.5)

## 2015-03-14 NOTE — Assessment & Plan Note (Signed)
BP stable on current meds. Continue same, obtain bmet.  

## 2015-03-14 NOTE — Assessment & Plan Note (Signed)
LDL at goal, continue lipitor.  °

## 2015-03-14 NOTE — Assessment & Plan Note (Signed)
Due for dexa, mammogram, flu shot.

## 2015-03-14 NOTE — Patient Instructions (Signed)
Please complete lab work prior to leaving You will be contacted about your referral for colonoscopy and bone density.

## 2015-03-14 NOTE — Progress Notes (Signed)
Pre visit review using our clinic review tool, if applicable. No additional management support is needed unless otherwise documented below in the visit note. 

## 2015-03-14 NOTE — Assessment & Plan Note (Signed)
Stable, continue Detrol LA.  

## 2015-03-14 NOTE — Progress Notes (Signed)
Subjective:    Patient ID: Terry Mcneil, female    DOB: Oct 27, 1940, 74 y.o.   MRN: [id number]  HPI    Review of Systems     Objective:   Physical Exam        Assessment & Plan:   Subjective:    Terry Mcneil is a 74 y.o. female who presents for Medicare Annual/Subsequent preventive examination.  Preventive Screening-Counseling & Management  Tobacco History  Smoking status  . Former Smoker  . Quit date: 08/27/1978  Smokeless tobacco  . Never Used     Problems Prior to Visit 1.  Hyperlipidemia- maintained on lipitor 20mg .   Lab Results  Component Value Date   CHOL 164 12/09/2014   HDL 72.20 12/09/2014   LDLCALC 74 12/09/2014   TRIG 92.0 12/09/2014   CHOLHDL 2 12/09/2014    2.  Overactive Bladder-  maintained on Detrol LA. Reports well controlled.  Occasional dry mouth- but not bad.    3.  HTN-  maintained on lisinopril-hctz.  She denies CP/SOB or swelling.  BP Readings from Last 3 Encounters:  03/14/15 113/61  12/09/14 128/82  06/10/14 118/78     Immunizations:  Flu shot today,  Shingles/pneumovax/prevnar up to date.  Colonoscopy: 2011- recommended a 5 yr follow up.   Dexa:due Pap Smear: 3-4 years ago- normal.  Mammogram: pt will schedule    Current Problems (verified) Patient Active Problem List   Diagnosis Date Noted  . Lower back pain 06/10/2014  . Osteopenia 02/07/2014  . Other malaise and fatigue 04/17/2013  . Routine general medical examination at a health care facility 04/11/2013  . Need for prophylactic vaccination and inoculation against influenza 04/11/2013  . Essential hypertension, benign 10/15/2012  . Hyperlipidemia 10/15/2012  . Overactive bladder 10/15/2012    Medications Prior to Visit Current Outpatient Prescriptions on File Prior to Visit  Medication Sig Dispense Refill  . aspirin EC 81 MG tablet Take 81 mg by mouth daily.    Marland Kitchen atorvastatin (LIPITOR) 20 MG tablet TAKE 1 TABLET (20 MG TOTAL) BY MOUTH DAILY. (Patient  taking differently: Take 1/2 tablet by mouth daily) 30 tablet 5  . calcium-vitamin D (OSCAL WITH D) 500-200 MG-UNIT per tablet Take 1 tablet by mouth 2 (two) times daily.    . carvedilol (COREG) 6.25 MG tablet TAKE 1 TABLET (6.25 MG TOTAL) BY MOUTH 2 (TWO) TIMES DAILY WITH A MEAL. (Patient taking differently: Take 1/2 tablet twice a day) 60 tablet 2  . lisinopril-hydrochlorothiazide (PRINZIDE,ZESTORETIC) 20-25 MG tablet TAKE 1 TABLET BY MOUTH DAILY. 90 tablet 1  . Multiple Vitamin (MULTIVITAMIN WITH MINERALS) TABS Take 1 tablet by mouth daily.    Marland Kitchen tolterodine (DETROL LA) 4 MG 24 hr capsule TAKE 1 CAPSULE (4 MG TOTAL) BY MOUTH DAILY. 30 capsule 3   No current facility-administered medications on file prior to visit.    Current Medications (verified) Current Outpatient Prescriptions  Medication Sig Dispense Refill  . aspirin EC 81 MG tablet Take 81 mg by mouth daily.    Marland Kitchen atorvastatin (LIPITOR) 20 MG tablet TAKE 1 TABLET (20 MG TOTAL) BY MOUTH DAILY. (Patient taking differently: Take 1/2 tablet by mouth daily) 30 tablet 5  . calcium-vitamin D (OSCAL WITH D) 500-200 MG-UNIT per tablet Take 1 tablet by mouth 2 (two) times daily.    . carvedilol (COREG) 6.25 MG tablet TAKE 1 TABLET (6.25 MG TOTAL) BY MOUTH 2 (TWO) TIMES DAILY WITH A MEAL. (Patient taking differently: Take 1/2 tablet twice a  day) 60 tablet 2  . lisinopril-hydrochlorothiazide (PRINZIDE,ZESTORETIC) 20-25 MG tablet TAKE 1 TABLET BY MOUTH DAILY. 90 tablet 1  . Multiple Vitamin (MULTIVITAMIN WITH MINERALS) TABS Take 1 tablet by mouth daily.    Marland Kitchen tolterodine (DETROL LA) 4 MG 24 hr capsule TAKE 1 CAPSULE (4 MG TOTAL) BY MOUTH DAILY. 30 capsule 3   No current facility-administered medications for this visit.     Allergies (verified) Review of patient's allergies indicates no known allergies.   PAST HISTORY  Family History Family History  Problem Relation Age of Onset  . Heart disease Father 92    valve replacement    Social  History Social History  Substance Use Topics  . Smoking status: Former Smoker    Quit date: 08/27/1978  . Smokeless tobacco: Never Used  . Alcohol Use: Yes     Comment: 3 nights/ week- 2 glasses of wine     Are there smokers in your home (other than you)? No  Risk Factors Current exercise habits: Water aerobics/PT  Dietary issues discussed: reports healthy diet   Cardiac risk factors: advanced age (older than 19 for men, 68 for women), dyslipidemia and hypertension.  Depression Screen (Note: if answer to either of the following is "Yes", a more complete depression screening is indicated)   Over the past two weeks, have you felt down, depressed or hopeless? No  Over the past two weeks, have you felt little interest or pleasure in doing things? No  Have you lost interest or pleasure in daily life? No  Do you often feel hopeless? No  Do you cry easily over simple problems? No  Activities of Daily Living In your present state of health, do you have any difficulty performing the following activities?:  Driving? No Managing money?  No Feeding yourself? No Getting from bed to chair? No . Climbing a flight of stairs? No Preparing food and eating?: No Bathing or showering? No Getting dressed: No Getting to the toilet? No Using the toilet:No Moving around from place to place: No In the past year have you fallen or had a near fall?:No   Are you sexually active?  No  Do you have more than one partner?  No  Hearing Difficulties: No Do you often ask people to speak up or repeat themselves? No Do you experience ringing or noises in your ears? No Do you have difficulty understanding soft or whispered voices? No   Do you feel that you have a problem with memory? No  Do you often misplace items? No  Do you feel safe at home?  Yes  Cognitive Testing  Alert? Yes  Normal Appearance?Yes  Oriented to person? Yes  Place? Yes   Time? Yes  Recall of three objects?  Yes  Can perform  simple calculations? Yes  Displays appropriate judgment?Yes  Can read the correct time from a watch face?Yes   Advanced Directives have been discussed with the patient? Yes  List the Names of Other Physician/Practitioners you currently use: 1.  none  Indicate any recent Medical Services you may have received from other than [facility] providers in the past year (date may be approximate).  Immunization History  Administered Date(s) Administered  . Influenza,inj,Quad PF,36+ Mos 02/04/2013, 02/07/2014  . Pneumococcal Conjugate-13 09/07/2007  . Pneumococcal Polysaccharide-23 03/09/2014  . Td 06/13/2000, 09/10/2010  . Zoster 09/04/2007    Screening Tests Health Maintenance  Topic Date Due  . DEXA SCAN  12/15/2004  . INFLUENZA VACCINE  12/12/2014  . MAMMOGRAM  03/16/2015  . COLONOSCOPY  11/11/2019  . TETANUS/TDAP  09/09/2020  . ZOSTAVAX  Completed  . PNA vac Low Risk Adult  Completed    All answers were reviewed with the patient and necessary referrals were made:  O'SULLIVAN,Nilah Belcourt S., NP   03/14/2015   History reviewed: allergies, current medications, past family history, past medical history, past social history, past surgical history and problem list  Review of Systems see HPI    Objective:       Body mass index is 25.73 kg/(m^2). BP 113/61 mmHg  Pulse 71  Temp(Src) 98.1 F (36.7 C) (Oral)  Resp 16  Ht 5\' 5"  (1.651 m)  Wt 154 lb 9.6 oz (70.126 kg)  BMI 25.73 kg/m2  SpO2 100%     Physical Exam  Constitutional: [name] oriented to person, place, and time. She appears well-developed and well-nourished. No distress.  HENT:  Head: Normocephalic and atraumatic.  Right Ear: Tympanic membrane and ear canal normal.  Left Ear: Tympanic membrane and ear canal normal.  Mouth/Throat: Oropharynx is clear and moist.  Eyes: Pupils are equal, round, and reactive to light. No scleral icterus.  Neck: Normal range of motion. No thyromegaly present.  Cardiovascular: Normal rate  and regular rhythm.   No murmur heard. Pulmonary/Chest: Effort normal and breath sounds normal. No respiratory distress. He has no wheezes. She has no rales. She exhibits no tenderness.  Abdominal: Soft. Bowel sounds are normal. He exhibits no distension and no mass. There is no tenderness. There is no rebound and no guarding.  Musculoskeletal: She exhibits no edema.  Lymphadenopathy:    She has no cervical adenopathy.  Neurological: [name] alert and oriented to person, place, and time. She has normal reflexes. She exhibits normal muscle tone. Coordination normal.  Skin: Skin is warm and dry.  Psychiatric: She has a normal mood and affect. Her behavior is normal. Judgment and thought content normal.  Breasts: Examined lying Right: Without masses, retractions, discharge or axillary adenopathy.  Left: Without masses, retractions, discharge or axillary adenopathy.  Pelvic: deferred         Assessment & Plan:      Assessment:          Plan:     During the course of the visit the patient was educated and counseled about appropriate screening and preventive services including:    Screening mammography  Bone densitometry screening  Colorectal cancer screening  Diet review for nutrition referral? Yes ____  Not Indicated _x___   Patient Instructions (the written plan) was given to the patient.  Medicare Attestation I have personally reviewed: The patient's medical and social history Their use of alcohol, tobacco or illicit drugs Their current medications and supplements The patient's functional ability including ADLs,fall risks, home safety risks, cognitive, and hearing and visual impairment Diet and physical activities Evidence for depression or mood disorders  The patient's weight, height, BMI, and visual acuity have been recorded in the chart.  I have made referrals, counseling, and provided education to the patient based on review of the above and I have provided  the patient with a written personalized care plan for preventive services.     O'SULLIVAN,Ky Rumple S., NP   03/14/2015

## 2015-03-15 ENCOUNTER — Encounter: Payer: Self-pay | Admitting: Family

## 2015-03-17 DIAGNOSIS — M9983 Other biomechanical lesions of lumbar region: Secondary | ICD-10-CM | POA: Diagnosis not present

## 2015-03-17 DIAGNOSIS — M4150 Other secondary scoliosis, site unspecified: Secondary | ICD-10-CM | POA: Diagnosis not present

## 2015-03-17 DIAGNOSIS — M545 Low back pain: Secondary | ICD-10-CM | POA: Diagnosis not present

## 2015-03-17 DIAGNOSIS — M419 Scoliosis, unspecified: Secondary | ICD-10-CM | POA: Diagnosis not present

## 2015-03-21 DIAGNOSIS — M9983 Other biomechanical lesions of lumbar region: Secondary | ICD-10-CM | POA: Diagnosis not present

## 2015-03-21 DIAGNOSIS — M419 Scoliosis, unspecified: Secondary | ICD-10-CM | POA: Diagnosis not present

## 2015-03-21 DIAGNOSIS — M545 Low back pain: Secondary | ICD-10-CM | POA: Diagnosis not present

## 2015-03-24 ENCOUNTER — Other Ambulatory Visit: Payer: Self-pay | Admitting: Family

## 2015-03-24 DIAGNOSIS — M545 Low back pain: Secondary | ICD-10-CM | POA: Diagnosis not present

## 2015-03-24 DIAGNOSIS — M9983 Other biomechanical lesions of lumbar region: Secondary | ICD-10-CM | POA: Diagnosis not present

## 2015-03-24 DIAGNOSIS — M419 Scoliosis, unspecified: Secondary | ICD-10-CM | POA: Diagnosis not present

## 2015-03-31 DIAGNOSIS — M9983 Other biomechanical lesions of lumbar region: Secondary | ICD-10-CM | POA: Diagnosis not present

## 2015-03-31 DIAGNOSIS — M419 Scoliosis, unspecified: Secondary | ICD-10-CM | POA: Diagnosis not present

## 2015-03-31 DIAGNOSIS — M545 Low back pain: Secondary | ICD-10-CM | POA: Diagnosis not present

## 2015-04-14 DIAGNOSIS — M545 Low back pain: Secondary | ICD-10-CM | POA: Diagnosis not present

## 2015-04-14 DIAGNOSIS — M9983 Other biomechanical lesions of lumbar region: Secondary | ICD-10-CM | POA: Diagnosis not present

## 2015-04-14 DIAGNOSIS — M419 Scoliosis, unspecified: Secondary | ICD-10-CM | POA: Diagnosis not present

## 2015-04-19 DIAGNOSIS — M545 Low back pain: Secondary | ICD-10-CM | POA: Diagnosis not present

## 2015-04-19 DIAGNOSIS — M419 Scoliosis, unspecified: Secondary | ICD-10-CM | POA: Diagnosis not present

## 2015-04-19 DIAGNOSIS — M9983 Other biomechanical lesions of lumbar region: Secondary | ICD-10-CM | POA: Diagnosis not present

## 2015-04-21 DIAGNOSIS — Z86010 Personal history of colonic polyps: Secondary | ICD-10-CM | POA: Diagnosis not present

## 2015-04-21 DIAGNOSIS — D125 Benign neoplasm of sigmoid colon: Secondary | ICD-10-CM | POA: Diagnosis not present

## 2015-04-21 DIAGNOSIS — D126 Benign neoplasm of colon, unspecified: Secondary | ICD-10-CM | POA: Diagnosis not present

## 2015-04-25 DIAGNOSIS — M419 Scoliosis, unspecified: Secondary | ICD-10-CM | POA: Diagnosis not present

## 2015-04-25 DIAGNOSIS — M545 Low back pain: Secondary | ICD-10-CM | POA: Diagnosis not present

## 2015-04-25 DIAGNOSIS — M9983 Other biomechanical lesions of lumbar region: Secondary | ICD-10-CM | POA: Diagnosis not present

## 2015-05-04 DIAGNOSIS — M545 Low back pain: Secondary | ICD-10-CM | POA: Diagnosis not present

## 2015-05-04 DIAGNOSIS — M419 Scoliosis, unspecified: Secondary | ICD-10-CM | POA: Diagnosis not present

## 2015-05-04 DIAGNOSIS — M9983 Other biomechanical lesions of lumbar region: Secondary | ICD-10-CM | POA: Diagnosis not present

## 2015-05-10 ENCOUNTER — Encounter: Payer: Self-pay | Admitting: *Deleted

## 2015-05-18 DIAGNOSIS — M9983 Other biomechanical lesions of lumbar region: Secondary | ICD-10-CM | POA: Diagnosis not present

## 2015-05-18 DIAGNOSIS — M545 Low back pain: Secondary | ICD-10-CM | POA: Diagnosis not present

## 2015-05-18 DIAGNOSIS — M419 Scoliosis, unspecified: Secondary | ICD-10-CM | POA: Diagnosis not present

## 2015-06-01 DIAGNOSIS — M545 Low back pain: Secondary | ICD-10-CM | POA: Diagnosis not present

## 2015-06-01 DIAGNOSIS — M9983 Other biomechanical lesions of lumbar region: Secondary | ICD-10-CM | POA: Diagnosis not present

## 2015-06-01 DIAGNOSIS — M419 Scoliosis, unspecified: Secondary | ICD-10-CM | POA: Diagnosis not present

## 2015-06-13 ENCOUNTER — Other Ambulatory Visit: Payer: Self-pay | Admitting: Family

## 2015-06-15 DIAGNOSIS — M545 Low back pain: Secondary | ICD-10-CM | POA: Diagnosis not present

## 2015-07-04 DIAGNOSIS — M8588 Other specified disorders of bone density and structure, other site: Secondary | ICD-10-CM | POA: Diagnosis not present

## 2015-07-04 DIAGNOSIS — M85852 Other specified disorders of bone density and structure, left thigh: Secondary | ICD-10-CM | POA: Diagnosis not present

## 2015-07-04 DIAGNOSIS — E2839 Other primary ovarian failure: Secondary | ICD-10-CM | POA: Diagnosis not present

## 2015-07-04 LAB — HM DEXA SCAN

## 2015-08-03 ENCOUNTER — Other Ambulatory Visit: Payer: Self-pay | Admitting: Family

## 2015-08-03 ENCOUNTER — Other Ambulatory Visit: Payer: Self-pay

## 2015-08-03 DIAGNOSIS — Z1231 Encounter for screening mammogram for malignant neoplasm of breast: Secondary | ICD-10-CM

## 2015-08-17 ENCOUNTER — Emergency Department (HOSPITAL_BASED_OUTPATIENT_CLINIC_OR_DEPARTMENT_OTHER)
Admission: EM | Admit: 2015-08-17 | Discharge: 2015-08-17 | Disposition: A | Discharge status: Home / Self Care | Payer: Medicare Other | Attending: Emergency Medicine | Admitting: Emergency Medicine

## 2015-08-17 ENCOUNTER — Encounter (HOSPITAL_BASED_OUTPATIENT_CLINIC_OR_DEPARTMENT_OTHER): Payer: Self-pay | Admitting: Emergency Medicine

## 2015-08-17 DIAGNOSIS — M8580 Other specified disorders of bone density and structure, unspecified site: Secondary | ICD-10-CM | POA: Diagnosis not present

## 2015-08-17 DIAGNOSIS — Z8781 Personal history of (healed) traumatic fracture: Secondary | ICD-10-CM | POA: Insufficient documentation

## 2015-08-17 DIAGNOSIS — Z86010 Personal history of colonic polyps: Secondary | ICD-10-CM | POA: Insufficient documentation

## 2015-08-17 DIAGNOSIS — M1990 Unspecified osteoarthritis, unspecified site: Secondary | ICD-10-CM | POA: Insufficient documentation

## 2015-08-17 DIAGNOSIS — Z7982 Long term (current) use of aspirin: Secondary | ICD-10-CM | POA: Insufficient documentation

## 2015-08-17 DIAGNOSIS — Z87448 Personal history of other diseases of urinary system: Secondary | ICD-10-CM | POA: Diagnosis not present

## 2015-08-17 DIAGNOSIS — R05 Cough: Secondary | ICD-10-CM | POA: Insufficient documentation

## 2015-08-17 DIAGNOSIS — R55 Syncope and collapse: Secondary | ICD-10-CM

## 2015-08-17 DIAGNOSIS — J029 Acute pharyngitis, unspecified: Secondary | ICD-10-CM | POA: Diagnosis not present

## 2015-08-17 DIAGNOSIS — I10 Essential (primary) hypertension: Secondary | ICD-10-CM | POA: Diagnosis not present

## 2015-08-17 DIAGNOSIS — Z79899 Other long term (current) drug therapy: Secondary | ICD-10-CM | POA: Diagnosis not present

## 2015-08-17 DIAGNOSIS — Z8669 Personal history of other diseases of the nervous system and sense organs: Secondary | ICD-10-CM | POA: Insufficient documentation

## 2015-08-17 LAB — BASIC METABOLIC PANEL
ANION GAP: 14 (ref 5–15)
BUN: 25 mg/dL — ABNORMAL HIGH (ref 6–20)
CALCIUM: 9.5 mg/dL (ref 8.9–10.3)
CO2: 23 mmol/L (ref 22–32)
CREATININE: 1 mg/dL (ref 0.44–1.00)
Chloride: 100 mmol/L — ABNORMAL LOW (ref 101–111)
GFR calc Af Amer: >60 mL/min (ref >60)
GFR calc non Af Amer: 54 mL/min — ABNORMAL LOW (ref >60)
GLUCOSE: 127 mg/dL — AB (ref 65–99)
Potassium: 4.1 mmol/L (ref 3.5–5.1)
Sodium: 137 mmol/L (ref 135–145)

## 2015-08-17 LAB — CBC WITH DIFFERENTIAL/PLATELET
BASOS PCT: 1 %
Basophils Absolute: 0 10*3/uL (ref 0.0–0.1)
EOS ABS: 0.6 10*3/uL (ref 0.0–0.7)
EOS PCT: 7 %
HCT: 39.9 % (ref 36.0–46.0)
Hemoglobin: 13.6 g/dL (ref 12.0–15.0)
Lymphocytes Relative: 17 %
Lymphs Abs: 1.4 10*3/uL (ref 0.7–4.0)
MCH: 32.3 pg (ref 26.0–34.0)
MCHC: 34.1 g/dL (ref 30.0–36.0)
MCV: 94.8 fL (ref 78.0–100.0)
MONO ABS: 0.9 10*3/uL (ref 0.1–1.0)
MONOS PCT: 10 %
Neutro Abs: 5.5 10*3/uL (ref 1.7–7.7)
Neutrophils Relative %: 65 %
Platelets: 238 10*3/uL (ref 150–400)
RBC: 4.21 MIL/uL (ref 3.87–5.11)
RDW: 12.4 % (ref 11.5–15.5)
WBC: 8.4 10*3/uL (ref 4.0–10.5)

## 2015-08-17 NOTE — ED Notes (Signed)
Patient states that she went to get a pizza and she was cold and clammy while at the bar. The patient states that she went outside and sat down and felt better. The patient reports that a bystander reports that she did pass out, patient does not feel like she passed out.

## 2015-08-17 NOTE — ED Notes (Signed)
Assisted to bathroom without difficulty

## 2015-08-17 NOTE — Discharge Instructions (Signed)
Follow-up with your primary Dr. in the next 1-2 weeks, and return to the ER if you experience any additional or concerning symptoms.   Near-Syncope Near-syncope (commonly known as near fainting) is sudden weakness, dizziness, or feeling like you might pass out. During an episode of near-syncope, you may also develop pale skin, have tunnel vision, or feel sick to your stomach (nauseous). Near-syncope may occur when getting up after sitting or while standing for a long time. It is caused by a sudden decrease in blood flow to the brain. This decrease can result from various causes or triggers, most of which are not serious. However, because near-syncope can sometimes be a sign of something serious, a medical evaluation is required. The specific cause is often not determined. HOME CARE INSTRUCTIONS  Monitor your condition for any changes. The following actions may help to alleviate any discomfort you are experiencing:  Have someone stay with you until you feel stable.  Lie down right away and prop your feet up if you start feeling like you might faint. Breathe deeply and steadily. Wait until all the symptoms have passed. Most of these episodes last only a few minutes. You may feel tired for several hours.   Drink enough fluids to keep your urine clear or pale yellow.   If you are taking blood pressure or heart medicine, get up slowly when seated or lying down. Take several minutes to sit and then stand. This can reduce dizziness.  Follow up with your health care provider as directed. SEEK IMMEDIATE MEDICAL CARE IF:   You have a severe headache.   You have unusual pain in the chest, abdomen, or back.   You are bleeding from the mouth or rectum, or you have black or tarry stool.   You have an irregular or very fast heartbeat.   You have repeated fainting or have seizure-like jerking during an episode.   You faint when sitting or lying down.   You have confusion.   You have  difficulty walking.   You have severe weakness.   You have vision problems.  MAKE SURE YOU:   Understand these instructions.  Will watch your condition.  Will get help right away if you are not doing well or get worse.   This information is not intended to replace advice given to you by your health care provider. Make sure you discuss any questions you have with your health care provider.   Document Released: 04/29/2005 Document Revised: 05/04/2013 Document Reviewed: 10/02/2012 Elsevier Interactive Patient Education [employer].

## 2015-08-17 NOTE — ED Notes (Signed)
Pt states she was at a bar. Felt clammy and went outside. Lost consciousness for a few secs. FD came and checked her out. Brought to ED by husband. No pain. Neuro WNL. Has had cold s/s and took cough med. Similar episode after taking cough meds before x 1.

## 2015-08-17 NOTE — ED Notes (Signed)
Pt given d/c instructions. Verbalizes understanding. No questions. 

## 2015-08-17 NOTE — ED Notes (Signed)
MD at bedside. 

## 2015-08-17 NOTE — ED Provider Notes (Signed)
CSN: [id number]     Arrival date & time 08/17/15  1956 History  By signing my name below, I, Hansel Feinstein, attest that this documentation has been prepared under the direction and in the presence of Veryl Speak, MD. Electronically Signed: Hansel Feinstein, ED Scribe. 08/17/2015. 8:35 PM.    Chief Complaint  Patient presents with  . Near Syncope   Patient is a 75 y.o. female presenting with near-syncope. The history is provided by the patient. No language interpreter was used.  Near Syncope This is a new problem. The current episode started 1 to 2 hours ago. The problem has been rapidly improving. Pertinent negatives include no chest pain, no abdominal pain, no headaches and no shortness of breath. Nothing aggravates the symptoms. The symptoms are relieved by rest. She has tried nothing for the symptoms. The treatment provided moderate relief.   HPI Comments: Terry Mcneil is a 75 y.o. female with h/o HTN who presents to the Emergency Department complaining of an episode of syncope, witnessed by bystander, that occurred earlier this afternoon. Pt states that she began to feel clammy while getting a pizza so she sat outside and was told that she lost consciousness for seconds. Per pt, no seizure activity was witnessed. Denies CP, SOB, abdominal pain or HA prior to or following this episode. She states she has taken 4 tylenol today due to recent cough, sore throat. H/o one similar episode while taking delsym for a cold years ago. Normal food and fluid intake with her recent URI symptoms. No h/o DM, anemia. She states she has not missed any doses of her blood pressure medication today. Denies falls, melena, hematochezia, vaginal bleeding.   Past Medical History  Diagnosis Date  . Urge incontinence   . Colon polyps   . Osteopenia 10/11/2009  . Cataract   . Hypertension   . Arthritis   . Patellar fracture     Right Knee    Past Surgical History  Procedure Laterality Date  . Tubal ligation    . Vaginal  delivery      x2 -   . Tonsillectomy    . Orif patella Right 08/28/2012    Procedure: OPEN REDUCTION INTERNAL (ORIF) FIXATION PATELLA- RIGHT;  Surgeon: Newt Minion, MD;  Location: [facility];  Service: Orthopedics;  Laterality: Right;  Open Reduction Internal Fixation Right Patella  . Eye surgery Bilateral     Cataracts    Family History  Problem Relation Age of Onset  . Heart disease Father 1    valve replacement   Social History  Substance Use Topics  . Smoking status: Former Smoker    Quit date: 08/27/1978  . Smokeless tobacco: Never Used  . Alcohol Use: Yes     Comment: 3 nights/ week- 2 glasses of wine   OB History    No data available     Review of Systems  HENT: Positive for sore throat.   Respiratory: Positive for cough. Negative for shortness of breath.   Cardiovascular: Positive for near-syncope. Negative for chest pain.  Gastrointestinal: Negative for abdominal pain and blood in stool.  Genitourinary: Negative for vaginal bleeding.  Neurological: Positive for syncope. Negative for seizures and headaches.  All other systems reviewed and are negative.  Allergies  Review of patient's allergies indicates no known allergies.  Home Medications   Prior to Admission medications   Medication Sig Start Date End Date Taking? Authorizing Provider  aspirin EC 81 MG tablet Take 81 mg by mouth  daily.    Historical Provider, MD  atorvastatin (LIPITOR) 20 MG tablet TAKE 1 TABLET (20 MG TOTAL) BY MOUTH DAILY. Patient taking differently: Take 1/2 tablet by mouth daily 06/21/14   [name], NP  calcium-vitamin D (OSCAL WITH D) 500-200 MG-UNIT per tablet Take 1 tablet by mouth 2 (two) times daily.    Historical Provider, MD  carvedilol (COREG) 6.25 MG tablet Take 1/2 tablet twice a day. 03/27/15   [name], NP  lisinopril-hydrochlorothiazide (PRINZIDE,ZESTORETIC) 20-25 MG tablet TAKE 1 TABLET BY MOUTH DAILY. 02/21/15   [name], NP  Multiple Vitamin  (MULTIVITAMIN WITH MINERALS) TABS Take 1 tablet by mouth daily.    Historical Provider, MD  tolterodine (DETROL LA) 4 MG 24 hr capsule TAKE 1 CAPSULE (4 MG TOTAL) BY MOUTH DAILY. 06/13/15   [name], NP   BP 110/71 mmHg  Pulse 67  Temp(Src) 97.4 F (36.3 C) (Oral)  Resp 16  Ht 5\' 6"  (1.676 m)  Wt 154 lb (69.854 kg)  BMI 24.87 kg/m2  SpO2 93% Physical Exam  Constitutional: [name] oriented to person, place, and time. She appears well-developed and well-nourished. No distress.  HENT:  Head: Normocephalic and atraumatic.  Eyes: EOM are normal.  Neck: Normal range of motion.  Cardiovascular: Normal rate, regular rhythm and normal heart sounds.   Pulmonary/Chest: Effort normal and breath sounds normal.  Abdominal: Soft. She exhibits no distension. There is no tenderness.  Musculoskeletal: Normal range of motion.  Neurological: [name] alert and oriented to person, place, and time. No cranial nerve deficit. Coordination normal.  Cranial nerves 2-12 grossly intact. Normal finger to nose testing. Strength and sensation equal and intact bilaterally throughout the upper and lower extremities.Normal gait. Coordination intact.    Skin: Skin is warm and dry.  Psychiatric: She has a normal mood and affect. Judgment normal.  Nursing note and vitals reviewed.   ED Course  Procedures (including critical care time) DIAGNOSTIC STUDIES: Oxygen Saturation is 93% on RA, adequate by my interpretation.    COORDINATION OF CARE: 8:32 PM Discussed treatment plan with pt at bedside which includes UA, EKG and pt agreed to plan.   Labs Review Labs Reviewed  URINALYSIS, ROUTINE W REFLEX MICROSCOPIC (NOT AT [facility])   I have personally reviewed and evaluated these lab results as part of my medical decision-making.   EKG Interpretation   Date/Time:  Thursday August 17 2015 20:10:04 EDT Ventricular Rate:  65 PR Interval:  128 QRS Duration: 100 QT Interval:  429 QTC Calculation: 446 R Axis:    81 Text Interpretation:  Sinus rhythm Normal ECG Confirmed by Sharron Petruska  MD,  Samirah Scarpati ([id number]) on 08/17/2015 8:15:26 PM      MDM   Final diagnoses:  None    Workup reveals no evidence for anemia, electrolyte abnormality, or arrhythmia. She was observed for over 2 hours in the emergency department and has remained stable. I am uncertain as to what caused this near syncopal episode, however is most likely vasovagal in nature. She will be discharged with when necessary return.  I personally performed the services described in this documentation, which was scribed in my presence. The recorded information has been reviewed and is accurate.       Veryl Speak, MD 08/17/15 [id number]

## 2015-08-17 NOTE — ED Notes (Signed)
Patient unable to provide urine sample at this time. Will try again in a little bit.

## 2015-08-24 ENCOUNTER — Ambulatory Visit (HOSPITAL_BASED_OUTPATIENT_CLINIC_OR_DEPARTMENT_OTHER)
Admission: RE | Admit: 2015-08-24 | Discharge: 2015-08-24 | Disposition: A | Discharge status: Home / Self Care | Payer: Medicare Other | Source: Ambulatory Visit | Attending: Family | Admitting: Family

## 2015-08-24 DIAGNOSIS — R928 Other abnormal and inconclusive findings on diagnostic imaging of breast: Secondary | ICD-10-CM | POA: Diagnosis not present

## 2015-08-24 DIAGNOSIS — Z1231 Encounter for screening mammogram for malignant neoplasm of breast: Secondary | ICD-10-CM | POA: Insufficient documentation

## 2015-08-28 ENCOUNTER — Other Ambulatory Visit: Payer: Self-pay | Admitting: Family

## 2015-08-28 DIAGNOSIS — R928 Other abnormal and inconclusive findings on diagnostic imaging of breast: Secondary | ICD-10-CM

## 2015-09-05 ENCOUNTER — Ambulatory Visit
Admission: RE | Admit: 2015-09-05 | Discharge: 2015-09-05 | Disposition: A | Discharge status: Home / Self Care | Payer: Medicare Other | Source: Ambulatory Visit | Attending: Family | Admitting: Family

## 2015-09-05 DIAGNOSIS — R928 Other abnormal and inconclusive findings on diagnostic imaging of breast: Secondary | ICD-10-CM

## 2015-09-05 DIAGNOSIS — N6489 Other specified disorders of breast: Secondary | ICD-10-CM | POA: Diagnosis not present

## 2015-09-05 DIAGNOSIS — N63 Unspecified lump in breast: Secondary | ICD-10-CM | POA: Diagnosis not present

## 2015-09-12 ENCOUNTER — Ambulatory Visit: Payer: Medicare Other | Admitting: Family

## 2015-09-12 ENCOUNTER — Telehealth: Payer: Self-pay | Admitting: Family

## 2015-09-12 MED ORDER — LISINOPRIL-HYDROCHLOROTHIAZIDE 20-25 MG PO TABS: 1.0000 | ORAL_TABLET | Freq: Every day | ORAL | Status: DC

## 2015-09-12 MED ORDER — ATORVASTATIN CALCIUM 20 MG PO TABS: 10.0000 mg | ORAL_TABLET | Freq: Every day | ORAL | Status: DC

## 2015-09-12 NOTE — Telephone Encounter (Signed)
°  Relation to [id number] Call back number:[phone] Pharmacy:[facility]  Reason for call: pt states she is completely out of meds, rx, states she just need enough to get her through until her appt on Friday.  atorvastatin (LIPITOR) 20 MG tablet      lisinopril-hydrochlorothiazide (PRINZIDE,ZESTORETIC) 20-25 MG tablet

## 2015-09-12 NOTE — Telephone Encounter (Signed)
#  5 tablets sent of each med below, notified pt.

## 2015-09-14 NOTE — Telephone Encounter (Signed)
Pt was marked no show 09/12/15 9:30am appt because she was 15 min late, pt rescheduled for 09/15/15, charge or no charge?

## 2015-09-14 NOTE — Telephone Encounter (Signed)
No charge. 

## 2015-09-15 ENCOUNTER — Telehealth: Payer: Self-pay | Admitting: *Deleted

## 2015-09-15 ENCOUNTER — Encounter: Payer: Self-pay | Admitting: Family

## 2015-09-15 ENCOUNTER — Ambulatory Visit (INDEPENDENT_AMBULATORY_CARE_PROVIDER_SITE_OTHER): Payer: Medicare Other | Admitting: Family

## 2015-09-15 VITALS — BP 117/67 | HR 77 | Temp 98.2°F | Resp 16 | Ht 65.0 in | Wt 155.2 lb

## 2015-09-15 DIAGNOSIS — E785 Hyperlipidemia, unspecified: Secondary | ICD-10-CM | POA: Diagnosis not present

## 2015-09-15 DIAGNOSIS — I10 Essential (primary) hypertension: Secondary | ICD-10-CM

## 2015-09-15 DIAGNOSIS — G4700 Insomnia, unspecified: Secondary | ICD-10-CM

## 2015-09-15 MED ORDER — CARVEDILOL 6.25 MG PO TABS: ORAL_TABLET | ORAL | Status: DC

## 2015-09-15 MED ORDER — TOLTERODINE TARTRATE ER 4 MG PO CP24: ORAL_CAPSULE | ORAL | Status: DC

## 2015-09-15 MED ORDER — LISINOPRIL-HYDROCHLOROTHIAZIDE 20-25 MG PO TABS: 1.0000 | ORAL_TABLET | Freq: Every day | ORAL | Status: DC

## 2015-09-15 MED ORDER — ATORVASTATIN CALCIUM 20 MG PO TABS: 10.0000 mg | ORAL_TABLET | Freq: Every day | ORAL | Status: DC

## 2015-09-15 NOTE — Assessment & Plan Note (Signed)
At goal, on lipitor.  Continue same.

## 2015-09-15 NOTE — Progress Notes (Signed)
Subjective:    Patient ID: Terry Mcneil, female    DOB: 10-05-1940, 75 y.o.   MRN: [id number]  HPI  Terry Mcneil is a 75 yr old female who presents today for follow up.  1) HTN- current BP meds include coreg, zetoretic. BP Readings from Last 3 Encounters:  09/15/15 117/67  08/17/15 104/68  03/14/15 113/61   2) Hyperlipidemia-  Maintained on lipitor 20mg .  Lab Results  Component Value Date   CHOL 164 12/09/2014   HDL 72.20 12/09/2014   LDLCALC 74 12/09/2014   TRIG 92.0 12/09/2014   CHOLHDL 2 12/09/2014   3) Insomnia- reports waking up in the middle of the night about 4 times a week.  Cannot fall back to sleep.  2 glasses of wine before bedtime.   Pt have a vasovagal syncopal event 08/17/15 with neg ED eval. Denies recurrent syncope.  She reports she had not eaten properly that day.    Review of Systems  Respiratory: Negative for cough and shortness of breath.   Cardiovascular: Negative for chest pain.  Musculoskeletal: Negative for myalgias.   See HPI  Past Medical History  Diagnosis Date  . Urge incontinence   . Colon polyps   . Osteopenia 10/11/2009  . Cataract   . Hypertension   . Arthritis   . Patellar fracture     Right Knee      Social History   Social History  . Marital Status: Married    Spouse Name: Venecia Endress  . Number of Children: 2  . Years of Education: N/A   Occupational History  . Retired    Social History Main Topics  . Smoking status: Former Smoker    Quit date: 08/27/1978  . Smokeless tobacco: Never Used  . Alcohol Use: Yes     Comment: 3 nights/ week- 2 glasses of wine  . Drug Use: No  . Sexual Activity: Yes   Other Topics Concern  . 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     Alcohol Use:  Moderate (Wine), 2 glasses 3 times a week   Drug Use:  None   Diet:  Weight Watchers    Exercise:  Not regular   Hobbies: Reading , Gardening , Grandchildren                Past Surgical History  Procedure Laterality Date  . Tubal ligation    . Vaginal delivery      x2 -   . Tonsillectomy    . Orif patella Right 08/28/2012    Procedure: OPEN REDUCTION INTERNAL (ORIF) FIXATION PATELLA- RIGHT;  Surgeon: Newt Minion, MD;  Location: [facility];  Service: Orthopedics;  Laterality: Right;  Open Reduction Internal Fixation Right Patella  . Eye surgery Bilateral     Cataracts     Family History  Problem Relation Age of Onset  . Heart disease Father 22    valve replacement    No Known Allergies  Current Outpatient Prescriptions on File Prior to Visit  Medication Sig Dispense Refill  . aspirin EC 81 MG tablet Take 81 mg by mouth daily.    Marland Kitchen  atorvastatin (LIPITOR) 20 MG tablet Take 0.5 tablets (10 mg total) by mouth daily. 5 tablet 0  . calcium-vitamin D (OSCAL WITH D) 500-200 MG-UNIT per tablet Take 1 tablet by mouth 2 (two) times daily.    . carvedilol (COREG) 6.25 MG tablet Take 1/2 tablet twice a day. 30 tablet 5  . lisinopril-hydrochlorothiazide (PRINZIDE,ZESTORETIC) 20-25 MG tablet Take 1 tablet by mouth daily. 5 tablet 0  . Multiple Vitamin (MULTIVITAMIN WITH MINERALS) TABS Take 1 tablet by mouth daily.    Marland Kitchen tolterodine (DETROL LA) 4 MG 24 hr capsule TAKE 1 CAPSULE (4 MG TOTAL) BY MOUTH DAILY. 30 capsule 3   No current facility-administered medications on file prior to visit.    BP 117/67 mmHg  Pulse 77  Temp(Src) 98.2 F (36.8 C) (Oral)  Resp 16  Ht 5\' 5"  (1.651 m)  Wt 155 lb 3.2 oz (70.398 kg)  BMI 25.83 kg/m2  SpO2 98%       Objective:   Physical Exam  Constitutional: [name] oriented to person, place, and time. She appears well-developed and well-nourished.  HENT:  Head: Normocephalic and  atraumatic.  Cardiovascular: Normal rate, regular rhythm and normal heart sounds.   No murmur heard. Pulmonary/Chest: Effort normal and breath sounds normal. No respiratory distress. She has no wheezes.  Neurological: [name] alert and oriented to person, place, and time.  Psychiatric: She has a normal mood and affect. Her behavior is normal. Judgment and thought content normal.          Assessment & Plan:  Insomnia- advised pt to discontinue HS wine as alcohol can interfere with her sleep. She will try this.

## 2015-09-15 NOTE — Assessment & Plan Note (Signed)
Stable on current meds.  Continue same. 

## 2015-09-15 NOTE — Telephone Encounter (Signed)
Pt reported DEXA at [facility] 3-4 months ago. Will call for result.

## 2015-09-15 NOTE — Telephone Encounter (Signed)
reviewed most recent dexa. She has bone thinning but not severe enough to add fosamax.   I recommend that she continue oscal + D  one tablet by mouth twice daily.  In addition, please ensure regular weight bearing exercise such as walking.

## 2015-09-15 NOTE — Telephone Encounter (Signed)
Notified pt. 

## 2015-09-15 NOTE — Telephone Encounter (Signed)
Received DEXA from 2011 and 2017. Results abstracted and forwarded to PCP for review. Please advise?

## 2015-09-15 NOTE — Telephone Encounter (Signed)
Requested last 2 DEXA scans be faxed to nurse station. Awaiting results.

## 2015-09-15 NOTE — Progress Notes (Signed)
Pre visit review using our clinic review tool, if applicable. No additional management support is needed unless otherwise documented below in the visit note. 

## 2016-01-16 ENCOUNTER — Ambulatory Visit (INDEPENDENT_AMBULATORY_CARE_PROVIDER_SITE_OTHER): Payer: Medicare Other | Admitting: Family

## 2016-01-16 ENCOUNTER — Encounter: Payer: Self-pay | Admitting: Family

## 2016-01-16 VITALS — BP 117/80 | HR 75 | Temp 98.0°F | Resp 17 | Ht 65.0 in | Wt 156.6 lb

## 2016-01-16 DIAGNOSIS — Z23 Encounter for immunization: Secondary | ICD-10-CM | POA: Diagnosis not present

## 2016-01-16 DIAGNOSIS — N3281 Overactive bladder: Secondary | ICD-10-CM

## 2016-01-16 DIAGNOSIS — G4700 Insomnia, unspecified: Secondary | ICD-10-CM | POA: Insufficient documentation

## 2016-01-16 DIAGNOSIS — M545 Low back pain, unspecified: Secondary | ICD-10-CM

## 2016-01-16 DIAGNOSIS — I10 Essential (primary) hypertension: Secondary | ICD-10-CM

## 2016-01-16 DIAGNOSIS — E785 Hyperlipidemia, unspecified: Secondary | ICD-10-CM

## 2016-01-16 LAB — BASIC METABOLIC PANEL
BUN: 21 mg/dL (ref 6–23)
CO2: 30 mEq/L (ref 19–32)
CREATININE: 0.86 mg/dL (ref 0.40–1.20)
Calcium: 9.2 mg/dL (ref 8.4–10.5)
Chloride: 101 mEq/L (ref 96–112)
GFR: 68.17 mL/min (ref >60.00)
Glucose, Bld: 97 mg/dL (ref 70–99)
POTASSIUM: 4 meq/L (ref 3.5–5.1)
Sodium: 137 mEq/L (ref 135–145)

## 2016-01-16 MED ORDER — TOLTERODINE TARTRATE ER 4 MG PO CP24: ORAL_CAPSULE | ORAL | 1 refills | Status: DC

## 2016-01-16 MED ORDER — CARVEDILOL 6.25 MG PO TABS: ORAL_TABLET | ORAL | 1 refills | Status: DC

## 2016-01-16 MED ORDER — ATORVASTATIN CALCIUM 20 MG PO TABS: 10.0000 mg | ORAL_TABLET | Freq: Every day | ORAL | 1 refills | Status: DC

## 2016-01-16 MED ORDER — LISINOPRIL-HYDROCHLOROTHIAZIDE 20-25 MG PO TABS: 1.0000 | ORAL_TABLET | Freq: Every day | ORAL | 1 refills | Status: DC

## 2016-01-16 NOTE — Assessment & Plan Note (Signed)
Stable off meds.  Monitor.  

## 2016-01-16 NOTE — Assessment & Plan Note (Signed)
Improved following PT.

## 2016-01-16 NOTE — Progress Notes (Signed)
Pre visit review using our clinic review tool, if applicable. No additional management support is needed unless otherwise documented below in the visit note. 

## 2016-01-16 NOTE — Assessment & Plan Note (Signed)
Stable, continue detrol.

## 2016-01-16 NOTE — Patient Instructions (Signed)
Please complete lab work prior to leaving.   

## 2016-01-16 NOTE — Progress Notes (Signed)
Subjective:    Patient ID: Terry Mcneil, female    DOB: 05-16-1940, 75 y.o.   MRN: [id number]  HPI  Terry Mcneil is a 75 yr old female who presents today for follow up.  1) HTN- maintained on carvedilol and zestoretic.   BP Readings from Last 3 Encounters:  01/16/16 117/80  09/15/15 117/67  08/17/15 104/68   2) Hyperlipidemia- continues atorvastatin.  Lab Results  Component Value Date   CHOL 164 12/09/2014   HDL 72.20 12/09/2014   LDLCALC 74 12/09/2014   TRIG 92.0 12/09/2014   CHOLHDL 2 12/09/2014   3)  Insomnia- not currently on sleep aid. Reports that she has been more active and this is helping her to sleep.   4) Overactive bladder- maintained on detrol LA. This continues to help.   Chronic low back pain- reports that she saw neurosurgery (Dr. Rozanna Box) and he recommend that she see PT, this helped her a lot though she still experiences pain.   Review of Systems  Respiratory: Negative for shortness of breath.   Cardiovascular: Negative for chest pain and leg swelling.  Musculoskeletal: Negative for myalgias.   Past Medical History:  Diagnosis Date  . Arthritis   . Cataract   . Colon polyps   . Hypertension   . Osteopenia 10/11/2009  . Patellar fracture    Right Knee   . Urge incontinence      Social History   Social History  . Marital status: Married    Spouse name: Shivonne Trudel  . Number of children: 2  . Years of education: N/A   Occupational History  . Retired    Social History Main Topics  . Smoking status: Former Smoker    Quit date: 08/27/1978  . Smokeless tobacco: Never Used  . Alcohol use Yes     Comment: 3 nights/ week- 2 glasses of wine  . Drug use: No  . Sexual activity: Yes   Other Topics Concern  . 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     Alcohol Use:  Moderate (Wine), 2 glasses 3 times a week   Drug Use:  None   Diet:  Weight Watchers    Exercise:  Not regular   Hobbies: Reading , Gardening , Grandchildren                Past Surgical History:  Procedure Laterality Date  . EYE SURGERY Bilateral    Cataracts   . ORIF PATELLA Right 08/28/2012   Procedure: OPEN REDUCTION INTERNAL (ORIF) FIXATION PATELLA- RIGHT;  Surgeon: Newt Minion, MD;  Location: [facility];  Service: Orthopedics;  Laterality: Right;  Open Reduction Internal Fixation Right Patella  . TONSILLECTOMY    . TUBAL LIGATION    . VAGINAL DELIVERY     x2 -     Family History  Problem Relation Age of Onset  . Heart disease Father 37    valve replacement    No Known Allergies  Current Outpatient Prescriptions on File Prior to Visit  Medication Sig Dispense Refill  . aspirin EC 81 MG tablet Take 81 mg by mouth daily.    Marland Kitchen  calcium-vitamin D (OSCAL WITH D) 500-200 MG-UNIT per tablet Take 1 tablet by mouth 2 (two) times daily.    . Multiple Vitamin (MULTIVITAMIN WITH MINERALS) TABS Take 1 tablet by mouth daily.     No current facility-administered medications on file prior to visit.     BP 117/80 (BP Location: Right Arm, Patient Position: Sitting, Cuff Size: Normal)   Pulse 75   Temp 98 F (36.7 C) (Oral)   Resp 17   Ht 5\' 5"  (1.651 m)   Wt 156 lb 9.6 oz (71 kg)   SpO2 98%   BMI 26.06 kg/m        Objective:   Physical Exam  Constitutional: [name] oriented to person, place, and time. She appears well-developed and well-nourished.  Cardiovascular: Normal rate, regular rhythm and normal heart sounds.   No murmur heard. Pulmonary/Chest: Effort normal and breath sounds normal. No respiratory distress. She has no wheezes.  Musculoskeletal: She exhibits no edema.  Neurological: [name] alert and oriented to person, place,  and time.  Psychiatric: She has a normal mood and affect. Her behavior is normal. Judgment and thought content normal.          Assessment & Plan:  Flu shot today.

## 2016-01-16 NOTE — Assessment & Plan Note (Signed)
Stable on current meds. Continue same, obtain bmet.  

## 2016-01-16 NOTE — Assessment & Plan Note (Signed)
Tolerating atorvastatin  ?

## 2016-01-17 ENCOUNTER — Encounter: Payer: Self-pay | Admitting: Family

## 2016-02-28 DIAGNOSIS — M9903 Segmental and somatic dysfunction of lumbar region: Secondary | ICD-10-CM | POA: Diagnosis not present

## 2016-02-28 DIAGNOSIS — M5137 Other intervertebral disc degeneration, lumbosacral region: Secondary | ICD-10-CM | POA: Diagnosis not present

## 2016-02-28 DIAGNOSIS — M9905 Segmental and somatic dysfunction of pelvic region: Secondary | ICD-10-CM | POA: Diagnosis not present

## 2016-02-28 DIAGNOSIS — M5417 Radiculopathy, lumbosacral region: Secondary | ICD-10-CM | POA: Diagnosis not present

## 2016-02-28 DIAGNOSIS — M9904 Segmental and somatic dysfunction of sacral region: Secondary | ICD-10-CM | POA: Diagnosis not present

## 2016-02-29 DIAGNOSIS — M5137 Other intervertebral disc degeneration, lumbosacral region: Secondary | ICD-10-CM | POA: Diagnosis not present

## 2016-02-29 DIAGNOSIS — M5417 Radiculopathy, lumbosacral region: Secondary | ICD-10-CM | POA: Diagnosis not present

## 2016-02-29 DIAGNOSIS — M9903 Segmental and somatic dysfunction of lumbar region: Secondary | ICD-10-CM | POA: Diagnosis not present

## 2016-02-29 DIAGNOSIS — M9905 Segmental and somatic dysfunction of pelvic region: Secondary | ICD-10-CM | POA: Diagnosis not present

## 2016-02-29 DIAGNOSIS — M9904 Segmental and somatic dysfunction of sacral region: Secondary | ICD-10-CM | POA: Diagnosis not present

## 2016-03-01 DIAGNOSIS — M9905 Segmental and somatic dysfunction of pelvic region: Secondary | ICD-10-CM | POA: Diagnosis not present

## 2016-03-01 DIAGNOSIS — M5137 Other intervertebral disc degeneration, lumbosacral region: Secondary | ICD-10-CM | POA: Diagnosis not present

## 2016-03-01 DIAGNOSIS — M5417 Radiculopathy, lumbosacral region: Secondary | ICD-10-CM | POA: Diagnosis not present

## 2016-03-01 DIAGNOSIS — M9903 Segmental and somatic dysfunction of lumbar region: Secondary | ICD-10-CM | POA: Diagnosis not present

## 2016-03-01 DIAGNOSIS — M9904 Segmental and somatic dysfunction of sacral region: Secondary | ICD-10-CM | POA: Diagnosis not present

## 2016-03-05 DIAGNOSIS — M9903 Segmental and somatic dysfunction of lumbar region: Secondary | ICD-10-CM | POA: Diagnosis not present

## 2016-03-05 DIAGNOSIS — M5417 Radiculopathy, lumbosacral region: Secondary | ICD-10-CM | POA: Diagnosis not present

## 2016-03-05 DIAGNOSIS — M9904 Segmental and somatic dysfunction of sacral region: Secondary | ICD-10-CM | POA: Diagnosis not present

## 2016-03-05 DIAGNOSIS — M5137 Other intervertebral disc degeneration, lumbosacral region: Secondary | ICD-10-CM | POA: Diagnosis not present

## 2016-03-05 DIAGNOSIS — M9905 Segmental and somatic dysfunction of pelvic region: Secondary | ICD-10-CM | POA: Diagnosis not present

## 2016-03-06 DIAGNOSIS — M9905 Segmental and somatic dysfunction of pelvic region: Secondary | ICD-10-CM | POA: Diagnosis not present

## 2016-03-06 DIAGNOSIS — M5137 Other intervertebral disc degeneration, lumbosacral region: Secondary | ICD-10-CM | POA: Diagnosis not present

## 2016-03-06 DIAGNOSIS — M5417 Radiculopathy, lumbosacral region: Secondary | ICD-10-CM | POA: Diagnosis not present

## 2016-03-06 DIAGNOSIS — M9903 Segmental and somatic dysfunction of lumbar region: Secondary | ICD-10-CM | POA: Diagnosis not present

## 2016-03-06 DIAGNOSIS — M9904 Segmental and somatic dysfunction of sacral region: Secondary | ICD-10-CM | POA: Diagnosis not present

## 2016-03-08 DIAGNOSIS — M9904 Segmental and somatic dysfunction of sacral region: Secondary | ICD-10-CM | POA: Diagnosis not present

## 2016-03-08 DIAGNOSIS — M9903 Segmental and somatic dysfunction of lumbar region: Secondary | ICD-10-CM | POA: Diagnosis not present

## 2016-03-08 DIAGNOSIS — M5137 Other intervertebral disc degeneration, lumbosacral region: Secondary | ICD-10-CM | POA: Diagnosis not present

## 2016-03-08 DIAGNOSIS — M9905 Segmental and somatic dysfunction of pelvic region: Secondary | ICD-10-CM | POA: Diagnosis not present

## 2016-03-08 DIAGNOSIS — M5417 Radiculopathy, lumbosacral region: Secondary | ICD-10-CM | POA: Diagnosis not present

## 2016-03-12 DIAGNOSIS — M5137 Other intervertebral disc degeneration, lumbosacral region: Secondary | ICD-10-CM | POA: Diagnosis not present

## 2016-03-12 DIAGNOSIS — M5417 Radiculopathy, lumbosacral region: Secondary | ICD-10-CM | POA: Diagnosis not present

## 2016-03-12 DIAGNOSIS — M9903 Segmental and somatic dysfunction of lumbar region: Secondary | ICD-10-CM | POA: Diagnosis not present

## 2016-03-12 DIAGNOSIS — M9905 Segmental and somatic dysfunction of pelvic region: Secondary | ICD-10-CM | POA: Diagnosis not present

## 2016-03-12 DIAGNOSIS — M9904 Segmental and somatic dysfunction of sacral region: Secondary | ICD-10-CM | POA: Diagnosis not present

## 2016-03-13 DIAGNOSIS — M5137 Other intervertebral disc degeneration, lumbosacral region: Secondary | ICD-10-CM | POA: Diagnosis not present

## 2016-03-13 DIAGNOSIS — M5417 Radiculopathy, lumbosacral region: Secondary | ICD-10-CM | POA: Diagnosis not present

## 2016-03-13 DIAGNOSIS — M9903 Segmental and somatic dysfunction of lumbar region: Secondary | ICD-10-CM | POA: Diagnosis not present

## 2016-03-13 DIAGNOSIS — M9905 Segmental and somatic dysfunction of pelvic region: Secondary | ICD-10-CM | POA: Diagnosis not present

## 2016-03-13 DIAGNOSIS — M9904 Segmental and somatic dysfunction of sacral region: Secondary | ICD-10-CM | POA: Diagnosis not present

## 2016-03-15 DIAGNOSIS — M5137 Other intervertebral disc degeneration, lumbosacral region: Secondary | ICD-10-CM | POA: Diagnosis not present

## 2016-03-15 DIAGNOSIS — M9903 Segmental and somatic dysfunction of lumbar region: Secondary | ICD-10-CM | POA: Diagnosis not present

## 2016-03-15 DIAGNOSIS — M9905 Segmental and somatic dysfunction of pelvic region: Secondary | ICD-10-CM | POA: Diagnosis not present

## 2016-03-15 DIAGNOSIS — M9904 Segmental and somatic dysfunction of sacral region: Secondary | ICD-10-CM | POA: Diagnosis not present

## 2016-03-15 DIAGNOSIS — M5417 Radiculopathy, lumbosacral region: Secondary | ICD-10-CM | POA: Diagnosis not present

## 2016-03-19 DIAGNOSIS — M9904 Segmental and somatic dysfunction of sacral region: Secondary | ICD-10-CM | POA: Diagnosis not present

## 2016-03-19 DIAGNOSIS — M9903 Segmental and somatic dysfunction of lumbar region: Secondary | ICD-10-CM | POA: Diagnosis not present

## 2016-03-19 DIAGNOSIS — M5137 Other intervertebral disc degeneration, lumbosacral region: Secondary | ICD-10-CM | POA: Diagnosis not present

## 2016-03-19 DIAGNOSIS — M5417 Radiculopathy, lumbosacral region: Secondary | ICD-10-CM | POA: Diagnosis not present

## 2016-03-19 DIAGNOSIS — M9905 Segmental and somatic dysfunction of pelvic region: Secondary | ICD-10-CM | POA: Diagnosis not present

## 2016-03-20 DIAGNOSIS — M9905 Segmental and somatic dysfunction of pelvic region: Secondary | ICD-10-CM | POA: Diagnosis not present

## 2016-03-20 DIAGNOSIS — M9903 Segmental and somatic dysfunction of lumbar region: Secondary | ICD-10-CM | POA: Diagnosis not present

## 2016-03-20 DIAGNOSIS — M5417 Radiculopathy, lumbosacral region: Secondary | ICD-10-CM | POA: Diagnosis not present

## 2016-03-20 DIAGNOSIS — M5137 Other intervertebral disc degeneration, lumbosacral region: Secondary | ICD-10-CM | POA: Diagnosis not present

## 2016-03-20 DIAGNOSIS — M9904 Segmental and somatic dysfunction of sacral region: Secondary | ICD-10-CM | POA: Diagnosis not present

## 2016-03-22 DIAGNOSIS — M5137 Other intervertebral disc degeneration, lumbosacral region: Secondary | ICD-10-CM | POA: Diagnosis not present

## 2016-03-22 DIAGNOSIS — M9904 Segmental and somatic dysfunction of sacral region: Secondary | ICD-10-CM | POA: Diagnosis not present

## 2016-03-22 DIAGNOSIS — M9905 Segmental and somatic dysfunction of pelvic region: Secondary | ICD-10-CM | POA: Diagnosis not present

## 2016-03-22 DIAGNOSIS — M5417 Radiculopathy, lumbosacral region: Secondary | ICD-10-CM | POA: Diagnosis not present

## 2016-03-22 DIAGNOSIS — M9903 Segmental and somatic dysfunction of lumbar region: Secondary | ICD-10-CM | POA: Diagnosis not present

## 2016-03-26 DIAGNOSIS — M5417 Radiculopathy, lumbosacral region: Secondary | ICD-10-CM | POA: Diagnosis not present

## 2016-03-26 DIAGNOSIS — M9905 Segmental and somatic dysfunction of pelvic region: Secondary | ICD-10-CM | POA: Diagnosis not present

## 2016-03-26 DIAGNOSIS — M5137 Other intervertebral disc degeneration, lumbosacral region: Secondary | ICD-10-CM | POA: Diagnosis not present

## 2016-03-26 DIAGNOSIS — M9903 Segmental and somatic dysfunction of lumbar region: Secondary | ICD-10-CM | POA: Diagnosis not present

## 2016-03-26 DIAGNOSIS — M9904 Segmental and somatic dysfunction of sacral region: Secondary | ICD-10-CM | POA: Diagnosis not present

## 2016-03-27 DIAGNOSIS — M9905 Segmental and somatic dysfunction of pelvic region: Secondary | ICD-10-CM | POA: Diagnosis not present

## 2016-03-27 DIAGNOSIS — M9904 Segmental and somatic dysfunction of sacral region: Secondary | ICD-10-CM | POA: Diagnosis not present

## 2016-03-27 DIAGNOSIS — M9903 Segmental and somatic dysfunction of lumbar region: Secondary | ICD-10-CM | POA: Diagnosis not present

## 2016-03-27 DIAGNOSIS — M5417 Radiculopathy, lumbosacral region: Secondary | ICD-10-CM | POA: Diagnosis not present

## 2016-03-27 DIAGNOSIS — M5137 Other intervertebral disc degeneration, lumbosacral region: Secondary | ICD-10-CM | POA: Diagnosis not present

## 2016-03-29 DIAGNOSIS — M9904 Segmental and somatic dysfunction of sacral region: Secondary | ICD-10-CM | POA: Diagnosis not present

## 2016-03-29 DIAGNOSIS — M5417 Radiculopathy, lumbosacral region: Secondary | ICD-10-CM | POA: Diagnosis not present

## 2016-03-29 DIAGNOSIS — M5137 Other intervertebral disc degeneration, lumbosacral region: Secondary | ICD-10-CM | POA: Diagnosis not present

## 2016-03-29 DIAGNOSIS — M9905 Segmental and somatic dysfunction of pelvic region: Secondary | ICD-10-CM | POA: Diagnosis not present

## 2016-03-29 DIAGNOSIS — M9903 Segmental and somatic dysfunction of lumbar region: Secondary | ICD-10-CM | POA: Diagnosis not present

## 2016-04-09 DIAGNOSIS — M9904 Segmental and somatic dysfunction of sacral region: Secondary | ICD-10-CM | POA: Diagnosis not present

## 2016-04-09 DIAGNOSIS — M5137 Other intervertebral disc degeneration, lumbosacral region: Secondary | ICD-10-CM | POA: Diagnosis not present

## 2016-04-09 DIAGNOSIS — M5417 Radiculopathy, lumbosacral region: Secondary | ICD-10-CM | POA: Diagnosis not present

## 2016-04-09 DIAGNOSIS — M9903 Segmental and somatic dysfunction of lumbar region: Secondary | ICD-10-CM | POA: Diagnosis not present

## 2016-04-09 DIAGNOSIS — M9905 Segmental and somatic dysfunction of pelvic region: Secondary | ICD-10-CM | POA: Diagnosis not present

## 2016-04-10 DIAGNOSIS — M9903 Segmental and somatic dysfunction of lumbar region: Secondary | ICD-10-CM | POA: Diagnosis not present

## 2016-04-10 DIAGNOSIS — M9905 Segmental and somatic dysfunction of pelvic region: Secondary | ICD-10-CM | POA: Diagnosis not present

## 2016-04-10 DIAGNOSIS — M9904 Segmental and somatic dysfunction of sacral region: Secondary | ICD-10-CM | POA: Diagnosis not present

## 2016-04-10 DIAGNOSIS — M5417 Radiculopathy, lumbosacral region: Secondary | ICD-10-CM | POA: Diagnosis not present

## 2016-04-10 DIAGNOSIS — M5137 Other intervertebral disc degeneration, lumbosacral region: Secondary | ICD-10-CM | POA: Diagnosis not present

## 2016-04-12 DIAGNOSIS — M5417 Radiculopathy, lumbosacral region: Secondary | ICD-10-CM | POA: Diagnosis not present

## 2016-04-12 DIAGNOSIS — M9905 Segmental and somatic dysfunction of pelvic region: Secondary | ICD-10-CM | POA: Diagnosis not present

## 2016-04-12 DIAGNOSIS — M9904 Segmental and somatic dysfunction of sacral region: Secondary | ICD-10-CM | POA: Diagnosis not present

## 2016-04-12 DIAGNOSIS — M9903 Segmental and somatic dysfunction of lumbar region: Secondary | ICD-10-CM | POA: Diagnosis not present

## 2016-04-12 DIAGNOSIS — M5137 Other intervertebral disc degeneration, lumbosacral region: Secondary | ICD-10-CM | POA: Diagnosis not present

## 2016-04-16 ENCOUNTER — Encounter: Payer: Self-pay | Admitting: *Deleted

## 2016-04-16 ENCOUNTER — Ambulatory Visit (INDEPENDENT_AMBULATORY_CARE_PROVIDER_SITE_OTHER): Payer: Medicare Other | Admitting: *Deleted

## 2016-04-16 VITALS — BP 122/80 | HR 78 | Ht 64.5 in | Wt 156.8 lb

## 2016-04-16 DIAGNOSIS — Z0000 Encounter for general adult medical examination without abnormal findings: Secondary | ICD-10-CM

## 2016-04-16 NOTE — Progress Notes (Signed)
Noted and agree. 

## 2016-04-16 NOTE — Patient Instructions (Signed)
Bring a copy of your advance directives to your next office visit. Continue to eat heart healthy diet (full of fruits, vegetables, whole grains, lean protein, water--limit salt, fat, and sugar intake) and increase physical activity as tolerated.

## 2016-04-16 NOTE — Progress Notes (Signed)
Subjective:   Terry Mcneil is a 75 y.o. female who presents for an Initial Medicare Annual Wellness Visit.  Review of Systems    No ROS.  Medicare Wellness Visit.  Cardiac Risk Factors include: advanced age (>48men, >22 women);dyslipidemia;hypertension;sedentary lifestyle Sleep patterns: Sleeps 7hrs per nights. Wakes 1 or 2 time to urinate. States she is able to go right back to sleep and feels rested when she wakes.  Home Safety/Smoke Alarms:  Smoke alarms in home.  Living environment; residence and Firearm Safety: Pt lives at home with husband. No firearms in the home. States she feels very safe. Seat Belt Safety/Bike Helmet: Wears seatbelt.   Counseling:   Eye Exam- Follows with Dr. Bing Plume. Dental- [place] with Dr.Welch every 6 months.   Female:   Pap- States her last Pap was approx. 4 yrs ago and that it and all of her paps were normal. States no longer needs Pap per PCP.        Mammo- Last on 09/05/15: BI-RADS CATEGORY 2:Benign. One year f/u recommended per report.     Dexa scan-  Last 07/04/15:osteopenia     CCS- Last 2011-recommended a 5 yr follow up. Pt states she doesn't believe she will get another one.     Objective:    Today's Vitals   04/16/16 0945  BP: 122/80  Pulse: 78  SpO2: 98%  Weight: 156 lb 12.8 oz (71.1 kg)  Height: 5' 4.5" (1.638 m)   Body mass index is 26.5 kg/m.   Current Medications (verified) Outpatient Encounter Prescriptions as of 04/16/2016  Medication Sig  . aspirin EC 81 MG tablet Take 81 mg by mouth daily.  Marland Kitchen atorvastatin (LIPITOR) 20 MG tablet Take 0.5 tablets (10 mg total) by mouth daily.  . calcium-vitamin D (OSCAL WITH D) 500-200 MG-UNIT per tablet Take 1 tablet by mouth 2 (two) times daily.  . carvedilol (COREG) 6.25 MG tablet Take 1/2 tablet twice a day.  . lisinopril-hydrochlorothiazide (PRINZIDE,ZESTORETIC) 20-25 MG tablet Take 1 tablet by mouth daily.  . Multiple Vitamin (MULTIVITAMIN WITH MINERALS) TABS Take 1 tablet by mouth  daily.  Marland Kitchen tolterodine (DETROL LA) 4 MG 24 hr capsule TAKE 1 CAPSULE (4 MG TOTAL) BY MOUTH DAILY.   No facility-administered encounter medications on file as of 04/16/2016.     Allergies (verified) Patient has no known allergies.   History: Past Medical History:  Diagnosis Date  . Arthritis   . Cataract   . Colon polyps   . Hypertension   . Osteopenia 10/11/2009  . Patellar fracture    Right Knee   . Urge incontinence    Past Surgical History:  Procedure Laterality Date  . EYE SURGERY Bilateral    Cataracts   . ORIF PATELLA Right 08/28/2012   Procedure: OPEN REDUCTION INTERNAL (ORIF) FIXATION PATELLA- RIGHT;  Surgeon: Newt Minion, MD;  Location: [facility];  Service: Orthopedics;  Laterality: Right;  Open Reduction Internal Fixation Right Patella  . TONSILLECTOMY    . TUBAL LIGATION    . VAGINAL DELIVERY     x2 -    Family History  Problem Relation Age of Onset  . Heart disease Father 93    valve replacement   Social History   Occupational History  . Retired    Social History Main Topics  . Smoking status: Former Smoker    Quit date: 08/27/1978  . Smokeless tobacco: Never Used  . Alcohol use Yes     Comment: 3 nights/ week- 2 glasses  of wine  . Drug use: No  . Sexual activity: Yes    Tobacco Counseling Counseling given: Not Answered   Activities of Daily Living In your present state of health, do you have any difficulty performing the following activities: 04/16/2016 01/16/2016  Hearing? N N  Vision? N N  Difficulty concentrating or making decisions? N N  Walking or climbing stairs? N N  Dressing or bathing? N N  Doing errands, shopping? N N  Preparing Food and eating ? N -  Using the Toilet? N -  In the past six months, have you accidently leaked urine? N -  Do you have problems with loss of bowel control? N -  Managing your Medications? N -  Managing your Finances? N -  Housekeeping or managing your Housekeeping? N -  Some recent data might be hidden     Immunizations and Health Maintenance Immunization History  Administered Date(s) Administered  . Influenza, High Dose Seasonal PF 03/14/2015, 01/16/2016  . Influenza,inj,Quad PF,36+ Mos 02/04/2013, 02/07/2014  . Pneumococcal Conjugate-13 09/07/2007  . Pneumococcal Polysaccharide-23 03/09/2014  . Td 06/13/2000, 09/10/2010  . Zoster 09/04/2007   There are no preventive care reminders to display for this patient.  Patient Care Team: [name], NP as PCP - General (Internal Medicine)  Indicate any recent Medical Services you may have received from other than [facility] providers in the past year (date may be approximate).     Assessment:   This is a routine wellness examination for [place].  Physical assessment deferred to PCP.   Hearing/Vision screen  Hearing Screening   125Hz  250Hz  500Hz  1000Hz  2000Hz  3000Hz  4000Hz  6000Hz  8000Hz   Right ear:   Pass Pass Pass  Pass    Left ear:   Pass Pass Pass  Pass    Comments: Able to hear conversational tones w/o difficulty. No issues reported.     Visual Acuity Screening   Right eye Left eye Both eyes  Without correction: 20/25 20/20 20/20   With correction:     Comments: Wears glasses for reading only. Follows with Dr.Digby.   Dietary issues and exercise activities discussed: Diet (meal preparation, eat out, water intake, caffeinated beverages, dairy products, fruits and vegetables):  Breakfast: Cheese toast and coffee Lunch: Ham sandwich with lettuce Dinner: Sausage and eggs. Pt states she drinks only about 2 glasses of water. Encouraged to increase water intake. Pt states she also drinks diet Coke and tea.  Current Exercise Habits: Home exercise routine, Type of exercise: stretching, Time (Minutes): 20, Frequency (Times/Week): 7, Weekly Exercise (Minutes/Week): 140, Intensity: Mild  Goals    . Begin going back to gym-starting with 3x week/ 20 min each time.    . Increase water intake      Depression Screen PHQ 2/9 Scores  04/16/2016 01/16/2016 03/14/2015 03/09/2014 03/09/2014 02/04/2013  PHQ - 2 Score 0 0 0 0 1 2  PHQ- 9 Score - - - - - 3    Fall Risk Fall Risk  04/16/2016 01/16/2016 03/14/2015 03/09/2014 03/09/2014  Falls in the past year? Yes No No No No  Number falls in past yr: 1 - - - -  Injury with Fall? No - - - -  Risk for fall due to : - - - - -  Follow up Education provided;Falls prevention discussed - - - -    Cognitive Function: MMSE - Mini Mental State Exam 04/16/2016  Orientation to time 5  Orientation to Place 5  Registration 3  Attention/ Calculation 4  Recall  3  Language- name 2 objects 2  Language- repeat 1  Language- follow 3 step command 3  Language- read & follow direction 1  Write a sentence 1  Copy design 0  Total score 28        Screening Tests Health Maintenance  Topic Date Due  . MAMMOGRAM  08/23/2016  . TETANUS/TDAP  09/09/2020  . INFLUENZA VACCINE  Completed  . DEXA SCAN  Completed  . ZOSTAVAX  Completed  . PNA vac Low Risk Adult  Completed      Plan:     Bring a copy of your advance directives to your next office visit. Continue to eat heart healthy diet (full of fruits, vegetables, whole grains, lean protein, water--limit salt, fat, and sugar intake) and increase physical activity as tolerated.   During the course of the visit, Seryna was educated and counseled about the following appropriate screening and preventive services:   Vaccines to include Pneumoccal, Influenza, Hepatitis B, Td, Zostavax, HCV  Cardiovascular disease screening  Colorectal cancer screening  Bone density screening  Diabetes screening  Glaucoma screening  Mammography/PAP  Nutrition counseling  Patient Instructions (the written plan) were given to the patient.    Shela Nevin, [place]   04/16/2016

## 2016-04-16 NOTE — Progress Notes (Signed)
Pre visit review using our clinic review tool, if applicable. No additional management support is needed unless otherwise documented below in the visit note. 

## 2016-04-17 DIAGNOSIS — M9904 Segmental and somatic dysfunction of sacral region: Secondary | ICD-10-CM | POA: Diagnosis not present

## 2016-04-17 DIAGNOSIS — M9903 Segmental and somatic dysfunction of lumbar region: Secondary | ICD-10-CM | POA: Diagnosis not present

## 2016-04-17 DIAGNOSIS — M5417 Radiculopathy, lumbosacral region: Secondary | ICD-10-CM | POA: Diagnosis not present

## 2016-04-17 DIAGNOSIS — M9905 Segmental and somatic dysfunction of pelvic region: Secondary | ICD-10-CM | POA: Diagnosis not present

## 2016-04-17 DIAGNOSIS — M5137 Other intervertebral disc degeneration, lumbosacral region: Secondary | ICD-10-CM | POA: Diagnosis not present

## 2016-04-19 DIAGNOSIS — M9904 Segmental and somatic dysfunction of sacral region: Secondary | ICD-10-CM | POA: Diagnosis not present

## 2016-04-19 DIAGNOSIS — M5137 Other intervertebral disc degeneration, lumbosacral region: Secondary | ICD-10-CM | POA: Diagnosis not present

## 2016-04-19 DIAGNOSIS — M9905 Segmental and somatic dysfunction of pelvic region: Secondary | ICD-10-CM | POA: Diagnosis not present

## 2016-04-19 DIAGNOSIS — M9903 Segmental and somatic dysfunction of lumbar region: Secondary | ICD-10-CM | POA: Diagnosis not present

## 2016-04-19 DIAGNOSIS — M5417 Radiculopathy, lumbosacral region: Secondary | ICD-10-CM | POA: Diagnosis not present

## 2016-04-24 DIAGNOSIS — M5417 Radiculopathy, lumbosacral region: Secondary | ICD-10-CM | POA: Diagnosis not present

## 2016-04-24 DIAGNOSIS — M9903 Segmental and somatic dysfunction of lumbar region: Secondary | ICD-10-CM | POA: Diagnosis not present

## 2016-04-24 DIAGNOSIS — M9905 Segmental and somatic dysfunction of pelvic region: Secondary | ICD-10-CM | POA: Diagnosis not present

## 2016-04-24 DIAGNOSIS — M9904 Segmental and somatic dysfunction of sacral region: Secondary | ICD-10-CM | POA: Diagnosis not present

## 2016-04-24 DIAGNOSIS — M5137 Other intervertebral disc degeneration, lumbosacral region: Secondary | ICD-10-CM | POA: Diagnosis not present

## 2016-05-01 DIAGNOSIS — M9904 Segmental and somatic dysfunction of sacral region: Secondary | ICD-10-CM | POA: Diagnosis not present

## 2016-05-01 DIAGNOSIS — M9903 Segmental and somatic dysfunction of lumbar region: Secondary | ICD-10-CM | POA: Diagnosis not present

## 2016-05-01 DIAGNOSIS — M5417 Radiculopathy, lumbosacral region: Secondary | ICD-10-CM | POA: Diagnosis not present

## 2016-05-01 DIAGNOSIS — M9905 Segmental and somatic dysfunction of pelvic region: Secondary | ICD-10-CM | POA: Diagnosis not present

## 2016-05-01 DIAGNOSIS — M5137 Other intervertebral disc degeneration, lumbosacral region: Secondary | ICD-10-CM | POA: Diagnosis not present

## 2016-05-03 DIAGNOSIS — M5137 Other intervertebral disc degeneration, lumbosacral region: Secondary | ICD-10-CM | POA: Diagnosis not present

## 2016-05-03 DIAGNOSIS — M9904 Segmental and somatic dysfunction of sacral region: Secondary | ICD-10-CM | POA: Diagnosis not present

## 2016-05-03 DIAGNOSIS — M5417 Radiculopathy, lumbosacral region: Secondary | ICD-10-CM | POA: Diagnosis not present

## 2016-05-03 DIAGNOSIS — M9903 Segmental and somatic dysfunction of lumbar region: Secondary | ICD-10-CM | POA: Diagnosis not present

## 2016-05-03 DIAGNOSIS — M9905 Segmental and somatic dysfunction of pelvic region: Secondary | ICD-10-CM | POA: Diagnosis not present

## 2016-08-12 ENCOUNTER — Other Ambulatory Visit: Payer: Self-pay | Admitting: Family

## 2016-08-12 MED ORDER — LISINOPRIL-HYDROCHLOROTHIAZIDE 20-25 MG PO TABS: 1.0000 | ORAL_TABLET | Freq: Every day | ORAL | 1 refills | Status: DC

## 2016-08-12 MED ORDER — CARVEDILOL 6.25 MG PO TABS: ORAL_TABLET | ORAL | 1 refills | Status: DC

## 2016-08-12 MED ORDER — ATORVASTATIN CALCIUM 20 MG PO TABS: 10.0000 mg | ORAL_TABLET | Freq: Every day | ORAL | 1 refills | Status: DC

## 2016-08-12 NOTE — Telephone Encounter (Signed)
atorvastatin (LIPITOR) 20 MG tablet     calcium-vitamin D (OSCAL WITH D) 500-200 MG-UNIT per tablet    carvedilol (COREG) 6.25 MG tablet    lisinopril-hydrochlorothiazide (PRINZIDE,ZESTORETIC) 20-25 MG tablet    Patient asking for a refill on her medications she has one day left. She was informed provider is out of the office until next week but will forward to covering provider Terry Mcneil on [place]   [phone]

## 2016-08-12 NOTE — Telephone Encounter (Signed)
Notified pt that refills were sent to pharmacy.

## 2016-08-15 ENCOUNTER — Other Ambulatory Visit: Payer: Self-pay | Admitting: Family

## 2016-08-15 MED ORDER — TOLTERODINE TARTRATE ER 4 MG PO CP24: ORAL_CAPSULE | ORAL | 1 refills | Status: DC

## 2016-08-15 NOTE — Telephone Encounter (Signed)
Caller name: Relationship to patient: Self Can be reached: [phone]  Pharmacy:  [place], [address] [phone] (Phone) [phone] (Fax)     Reason for call: Refill tolterodine (DETROL LA) 4 MG 24 hr capsule

## 2016-08-15 NOTE — Telephone Encounter (Signed)
Notified pt rx was sent to pharmacy

## 2016-08-21 ENCOUNTER — Encounter: Payer: Self-pay | Admitting: Family

## 2016-08-21 ENCOUNTER — Ambulatory Visit: Payer: Medicare Other | Admitting: Family

## 2016-08-21 ENCOUNTER — Ambulatory Visit (INDEPENDENT_AMBULATORY_CARE_PROVIDER_SITE_OTHER): Payer: Medicare Other | Admitting: Family

## 2016-08-21 VITALS — BP 141/78 | HR 91 | Temp 98.1°F | Resp 18 | Ht 65.0 in | Wt 156.0 lb

## 2016-08-21 DIAGNOSIS — I10 Essential (primary) hypertension: Secondary | ICD-10-CM

## 2016-08-21 DIAGNOSIS — E785 Hyperlipidemia, unspecified: Secondary | ICD-10-CM

## 2016-08-21 DIAGNOSIS — N3281 Overactive bladder: Secondary | ICD-10-CM

## 2016-08-21 LAB — BASIC METABOLIC PANEL
BUN: 31 mg/dL — AB (ref 6–23)
CALCIUM: 10 mg/dL (ref 8.4–10.5)
CO2: 27 mEq/L (ref 19–32)
Chloride: 101 mEq/L (ref 96–112)
Creatinine, Ser: 0.85 mg/dL (ref 0.40–1.20)
GFR: 68.99 mL/min (ref >60.00)
Glucose, Bld: 100 mg/dL — ABNORMAL HIGH (ref 70–99)
Potassium: 3.9 mEq/L (ref 3.5–5.1)
Sodium: 138 mEq/L (ref 135–145)

## 2016-08-21 LAB — LIPID PANEL
CHOLESTEROL: 160 mg/dL (ref 0–200)
HDL: 65.8 mg/dL (ref >39.00)
LDL CALC: 77 mg/dL (ref 0–99)
NonHDL: 94.1
Total CHOL/HDL Ratio: 2
Triglycerides: 87 mg/dL (ref 0.0–149.0)
VLDL: 17.4 mg/dL (ref 0.0–40.0)

## 2016-08-21 NOTE — Progress Notes (Signed)
Subjective:    Patient ID: Terry Mcneil, female    DOB: 09/24/1940, 76 y.o.   MRN: [id number]  HPI  Terry Mcneil is a 76 yr old female who presents today for follow up.  1) HTN- maintained on coreg and lisinopril/hctz.  Denies swelling or sob.  BP Readings from Last 3 Encounters:  08/21/16 (!) 141/78  04/16/16 122/80  01/16/16 117/80   2) Hyperlipidemia-  Maintained on lipitor. Denies myalgia.  Lab Results  Component Value Date   CHOL 164 12/09/2014   HDL 72.20 12/09/2014   LDLCALC 74 12/09/2014   TRIG 92.0 12/09/2014   CHOLHDL 2 12/09/2014   3) OAB-  Maintained on detrol. Reports that symptoms are well controlled on this regimen.   Review of Systems See HPI     Past Medical History:  Diagnosis Date  . Arthritis   . Cataract   . Colon polyps   . Hypertension   . Osteopenia 10/11/2009  . Patellar fracture    Right Knee   . Urge incontinence      Social History   Social History  . Marital status: Married    Spouse name: Eulogia Dismore  . Number of children: 2  . Years of education: N/A   Occupational History  . Retired    Social History Main Topics  . Smoking status: Former Smoker    Quit date: 08/27/1978  . Smokeless tobacco: Never Used  . Alcohol use Yes     Comment: 3 nights/ week- 2 glasses of wine  . Drug use: No  . Sexual activity: Yes   Other Topics Concern  . 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     Alcohol Use:  Moderate (Wine), 2 glasses 3 times a week   Drug Use:  None   Diet:  Weight Watchers    Exercise:  Not regular   Hobbies: Reading , Gardening , Grandchildren                Past Surgical History:  Procedure Laterality  Date  . EYE SURGERY Bilateral    Cataracts   . ORIF PATELLA Right 08/28/2012   Procedure: OPEN REDUCTION INTERNAL (ORIF) FIXATION PATELLA- RIGHT;  Surgeon: Newt Minion, MD;  Location: [facility];  Service: Orthopedics;  Laterality: Right;  Open Reduction Internal Fixation Right Patella  . TONSILLECTOMY    . TUBAL LIGATION    . VAGINAL DELIVERY     x2 -     Family History  Problem Relation Age of Onset  . Heart disease Father 31    valve replacement    No Known Allergies  Current Outpatient Prescriptions on File Prior to Visit  Medication Sig Dispense Refill  . aspirin EC 81 MG tablet Take 81 mg by mouth daily.    Marland Kitchen atorvastatin (LIPITOR) 20 MG tablet Take 0.5 tablets (10 mg total) by mouth daily. 45 tablet 1  . calcium-vitamin D (OSCAL WITH D) 500-200 MG-UNIT per tablet Take 1 tablet by mouth 2 (two) times daily.    . carvedilol (COREG) 6.25 MG tablet Take 1/2 tablet twice a day. 90 tablet 1  . lisinopril-hydrochlorothiazide (  PRINZIDE,ZESTORETIC) 20-25 MG tablet Take 1 tablet by mouth daily. 90 tablet 1  . Multiple Vitamin (MULTIVITAMIN WITH MINERALS) TABS Take 1 tablet by mouth daily.    Marland Kitchen tolterodine (DETROL LA) 4 MG 24 hr capsule TAKE 1 CAPSULE (4 MG TOTAL) BY MOUTH DAILY. 90 capsule 1   No current facility-administered medications on file prior to visit.     BP (!) 141/78 (BP Location: Right Arm, Cuff Size: Normal)   Pulse 91   Temp 98.1 F (36.7 C) (Oral)   Resp 18   Ht 5\' 5"  (1.651 m)   Wt 156 lb (70.8 kg)   SpO2 95% Comment: room air  BMI 25.96 kg/m    Objective:   Physical Exam  Constitutional: She appears well-developed and well-nourished.  Cardiovascular: Normal rate, regular rhythm and normal heart sounds.   No murmur heard. Pulmonary/Chest: Effort normal and breath sounds normal. No respiratory distress. She has no wheezes.  Psychiatric: She has a normal mood and affect. Her behavior is normal. Judgment and thought content normal.          Assessment &  Plan:

## 2016-08-21 NOTE — Patient Instructions (Addendum)
Please complete lab work piror to leaving/  

## 2016-08-21 NOTE — Assessment & Plan Note (Signed)
Tolerating statin, obtain follow-up lipid panel. 

## 2016-08-21 NOTE — Assessment & Plan Note (Signed)
Stable on detrol, continue same.  

## 2016-08-21 NOTE — Assessment & Plan Note (Signed)
BP stable on current medications.

## 2016-08-21 NOTE — Progress Notes (Signed)
Pre visit review using our clinic review tool, if applicable. No additional management support is needed unless otherwise documented below in the visit note. 

## 2016-08-22 ENCOUNTER — Encounter: Payer: Self-pay | Admitting: Family

## 2016-08-27 ENCOUNTER — Other Ambulatory Visit: Payer: Self-pay | Admitting: Family

## 2016-08-27 DIAGNOSIS — Z1231 Encounter for screening mammogram for malignant neoplasm of breast: Secondary | ICD-10-CM

## 2016-08-29 ENCOUNTER — Ambulatory Visit (HOSPITAL_BASED_OUTPATIENT_CLINIC_OR_DEPARTMENT_OTHER)
Admission: RE | Admit: 2016-08-29 | Discharge: 2016-08-29 | Disposition: A | Discharge status: Home / Self Care | Payer: Medicare Other | Source: Ambulatory Visit | Attending: Family | Admitting: Family

## 2016-08-29 ENCOUNTER — Encounter (HOSPITAL_BASED_OUTPATIENT_CLINIC_OR_DEPARTMENT_OTHER): Payer: Self-pay

## 2016-08-29 DIAGNOSIS — N6489 Other specified disorders of breast: Secondary | ICD-10-CM | POA: Insufficient documentation

## 2016-08-29 DIAGNOSIS — Z1231 Encounter for screening mammogram for malignant neoplasm of breast: Secondary | ICD-10-CM | POA: Insufficient documentation

## 2016-09-02 ENCOUNTER — Other Ambulatory Visit: Payer: Self-pay | Admitting: Family

## 2016-09-02 DIAGNOSIS — R928 Other abnormal and inconclusive findings on diagnostic imaging of breast: Secondary | ICD-10-CM

## 2016-09-04 ENCOUNTER — Other Ambulatory Visit: Payer: Self-pay | Admitting: Family

## 2016-09-04 ENCOUNTER — Ambulatory Visit
Admission: RE | Admit: 2016-09-04 | Discharge: 2016-09-04 | Disposition: A | Discharge status: Home / Self Care | Payer: Medicare Other | Source: Ambulatory Visit | Attending: Family | Admitting: Family

## 2016-09-04 DIAGNOSIS — N6320 Unspecified lump in the left breast, unspecified quadrant: Secondary | ICD-10-CM

## 2016-09-04 DIAGNOSIS — R928 Other abnormal and inconclusive findings on diagnostic imaging of breast: Secondary | ICD-10-CM

## 2016-09-06 ENCOUNTER — Ambulatory Visit
Admission: RE | Admit: 2016-09-06 | Discharge: 2016-09-06 | Disposition: A | Discharge status: Home / Self Care | Payer: Medicare Other | Source: Ambulatory Visit | Attending: Family | Admitting: Family

## 2016-09-06 ENCOUNTER — Other Ambulatory Visit: Payer: Self-pay | Admitting: Family

## 2016-09-06 DIAGNOSIS — N6320 Unspecified lump in the left breast, unspecified quadrant: Secondary | ICD-10-CM

## 2016-09-10 ENCOUNTER — Ambulatory Visit: Payer: Self-pay | Admitting: Surgery

## 2016-09-10 DIAGNOSIS — C801 Malignant (primary) neoplasm, unspecified: Secondary | ICD-10-CM

## 2016-09-10 DIAGNOSIS — C50412 Malignant neoplasm of upper-outer quadrant of left female breast: Secondary | ICD-10-CM

## 2016-09-10 HISTORY — PX: BREAST LUMPECTOMY: SHX2

## 2016-09-10 HISTORY — DX: Malignant (primary) neoplasm, unspecified: C80.1

## 2016-09-10 NOTE — H&P (Signed)
Terry Mcneil 09/10/2016 10:16 AM Location: [facility] Patient #: [id number] DOB: [birth year] Married / Language: English / Race: White Female  History of Present Illness Terry Mcneil Rodger MD; 09/10/2016 1:51 PM) Patient words: Patient sent at Terry request of Dr. Ammie Mcneil for an abnormal mammogram. Terry patient went for screening mammogram and a 4 mm spiculated mass was noted left breast in Terry upper outer quadrant. Core biopsy showed invasive mammary carcinoma favoring a double phenotype. Receptors are pending. Patient denies any history of breast pain, nipple discharge or mass.            CLINICAL DATA: 76 year old female presenting for screening recall of a possible left breast mass.  EXAM: 2D DIGITAL DIAGNOSTIC UNILATERAL LEFT MAMMOGRAM WITH CAD AND ADJUNCT TOMO  LEFT BREAST ULTRASOUND  COMPARISON: Previous exam(Mcneil).  ACR Breast Density Category b: There are scattered areas of fibroglandular density.  FINDINGS: In Terry upper-outer quadrant of Terry left breast, posterior depth, there is a persistent asymmetry, which on Terry MLO view appears to be a 4 mm possibly spiculated mass. Ultrasound will be performed for further evaluation.  Mammographic images were processed with CAD.  No definite palpable masses are identified in Terry upper-outer quadrant of Terry left breast.  Ultrasound targeted to Terry left breast at 1:30, 4 cm from Terry nipple demonstrates a small hypoechoic irregular shadowing mass measuring 4 x 4 x 4 mm, corresponding in size and position with Terry mammographic finding of concern. This is located near Terry benign-appearing lymph node seen on ultrasound a year ago. Ultrasound of Terry left axilla demonstrates multiple normal-appearing lymph nodes. There is 1 prominent lymph node with a cortex measuring just under 4 mm, however there is no suspicious focal bulge and Terry fatty hilum is preserved.  IMPRESSION: 1. There is a suspicious mass  in Terry left breast at 1:30.  2. There is a slightly prominent left axillary lymph node which appears otherwise morphologically normal.  RECOMMENDATION: Ultrasound-guided biopsy is recommended for Terry 4 mm mass in Terry left breast at 1:30.  I have discussed Terry findings and recommendations with Terry patient. Results were also provided in writing at Terry conclusion of Terry visit. If applicable, a reminder letter will be sent to Terry patient regarding Terry next appointment.  BI-RADS CATEGORY 4: Suspicious.   Electronically Signed By: Terry Mcneil M.D. On: 09/04/2016 12:40               ADDITIONAL INFORMATION: Terry malignant cells are positive for E-cadherin, supporting a ductal phenotype. (JBK:ah 09/09/16) Terry Cutter MD Pathologist, Electronic Signature ( Signed 09/09/2016) FINAL DIAGNOSIS Diagnosis Breast, left, needle core biopsy, 1:30 o'clock - INVASIVE MAMMARY CARCINOMA. - SEE COMMENT. Microscopic Comment Terry carcinoma appears grade 1. An E-Cadherin stain and a breast prognostic profile will be performed and Terry results reported separately. Terry results were called to Terry Mcneil on 09/09/16. (JBK:gt, 09/09/16) Terry Cutter MD Pathologist, Electronic Signature (Case signed 09/09/2016) Specimen Gross and Clinical Information Specimen Comment In formalin: 3:05 PM; extracted < 5 min; screening detected left breast mass; right breast benign hyalinized fibroadenoma Specimen(Mcneil) Obtained: Breast, left, needle core biopsy, 1:30 o'clock 1 of 2 FINAL for Terry Mcneil, Terry Mcneil ([id number]) Specimen Clinical Information Suspicious for malignancy Gross Received in formalin labeled with Terry patient'Mcneil name and "Left breast 1:30", are 3 core(Mcneil) of tan-         CLINICAL DATA: 76 year old female presenting for screening recall of a possible left breast mass.  EXAM: 2D DIGITAL  DIAGNOSTIC UNILATERAL LEFT MAMMOGRAM WITH CAD AND ADJUNCT TOMO  LEFT  BREAST ULTRASOUND  COMPARISON: Previous exam(Mcneil).  ACR Breast Density Category b: There are scattered areas of fibroglandular density.  FINDINGS: In Terry upper-outer quadrant of Terry left breast, posterior depth, there is a persistent asymmetry, which on Terry MLO view appears to be a 4 mm possibly spiculated mass. Ultrasound will be performed for further evaluation.  Mammographic images were processed with CAD.  No definite palpable masses are identified in Terry upper-outer quadrant of Terry left breast.  Ultrasound targeted to Terry left breast at 1:30, 4 cm from Terry nipple demonstrates a small hypoechoic irregular shadowing mass measuring 4 x 4 x 4 mm, corresponding in size and position with Terry mammographic finding of concern. This is located near Terry benign-appearing lymph node seen on ultrasound a year ago. Ultrasound of Terry left axilla demonstrates multiple normal-appearing lymph nodes. There is 1 prominent lymph node with a cortex measuring just under 4 mm, however there is no suspicious focal bulge and Terry fatty hilum is preserved.  IMPRESSION: 1. There is a suspicious mass in Terry left breast at 1:30.  2. There is a slightly prominent left axillary lymph node which appears otherwise morphologically normal.  RECOMMENDATION: Ultrasound-guided biopsy is recommended for Terry 4 mm mass in Terry left breast at 1:30.  I have discussed Terry findings and recommendations with Terry patient. Results were also provided in writing at Terry conclusion of Terry visit. If applicable, a reminder letter will be sent to Terry patient regarding Terry next appointment.  BI-RADS CATEGORY 4: Suspicious.   Electronically Signed By: Terry Mcneil M.D. On: 09/04/2016 12:40.  Terry patient is a 76 year old female.   Past Surgical History (Terry Mcneil, CMA; 09/10/2016 10:17 AM) Breast Biopsy Bilateral. Cataract Surgery Bilateral. Knee Surgery Right.  Diagnostic Studies History (Terry  Mcneil, CMA; 09/10/2016 10:17 AM) Colonoscopy 1-5 years ago Mammogram within last year Pap Smear >5 years ago  Allergies (Terry Mcneil, CMA; 09/10/2016 10:19 AM) No Known Drug Allergies 09/10/2016 Allergies Reconciled  Medication History (Terry Mcneil, CMA; 09/10/2016 10:19 AM) Atorvastatin Calcium (20MG  Tablet, Oral) Active. Carvedilol (6.25MG  Tablet, Oral) Active. Lisinopril-Hydrochlorothiazide (20-25MG  Tablet, Oral) Active. Tolterodine Tartrate ER (4MG  Capsule ER 24HR, Oral) Active. Calcium "900" w/D (Oral) Active. Aspirin (81MG  Tablet DR, Oral) Active. Multivitamin Adults 50+ (Oral) Active. Medications Reconciled  Social History (Terry Mcneil, CMA; 09/10/2016 10:17 AM) Alcohol use Moderate alcohol use. No caffeine use No drug use Tobacco use Former smoker.  Family History (Terry Mcneil, CMA; 09/10/2016 10:17 AM) Heart Disease Father. Hypertension Mother. Migraine Headache Mother.  Pregnancy / Birth History (Terry Mcneil, CMA; 09/10/2016 10:17 AM) Age at menarche 20 years. Age of menopause 61-59 Gravida 2 Length (months) of breastfeeding 3-6 Maternal age 37-25 Para 2  Other Problems (Terry Mcneil, CMA; 09/10/2016 10:17 AM) Arthritis Back Pain Breast Cancer High blood pressure     Review of Systems (Terry Mcneil CMA; 09/10/2016 10:17 AM) General Not Present- Appetite Loss, Chills, Fatigue, Fever, Night Sweats, Weight Gain and Weight Loss. Skin Not Present- Change in Wart/Mole, Dryness, Hives, Jaundice, New Lesions, Non-Healing Wounds, Rash and Ulcer. HEENT Not Present- Earache, Hearing Loss, Hoarseness, Nose Bleed, Oral Ulcers, Ringing in Terry Ears, Seasonal Allergies, Sinus Pain, Sore Throat, Visual Disturbances, Wears glasses/contact lenses and Yellow Eyes. Respiratory Not Present- Bloody sputum, Chronic Cough, Difficulty Breathing, Snoring and Wheezing. Breast Present- Breast Mass. Not Present- Breast Pain, Nipple Discharge and Skin  Changes. Musculoskeletal Present- Back Pain. Not Present- Joint Pain, Joint  Stiffness, Muscle Pain, Muscle Weakness and Swelling of Extremities. Neurological Not Present- Decreased Memory, Fainting, Headaches, Numbness, Seizures, Tingling, Tremor, Trouble walking and Weakness. Psychiatric Not Present- Anxiety, Bipolar, Change in Sleep Pattern, Depression, Fearful and Frequent crying. Hematology Not Present- Blood Thinners, Easy Bruising, Excessive bleeding, Gland problems, HIV and Persistent Infections.  Vitals (Terry Mcneil CMA; 09/10/2016 10:19 AM) 09/10/2016 10:19 AM Weight: 157 lb Height: 65in Body Surface Area: 1.78 m Body Mass Index: 26.13 kg/m  Temp.: 47F  Pulse: 60 (Regular)  BP: 128/80 (Sitting, Left Arm, Standard)      Physical Exam (Nickalous Stingley A. Demondre Aguas MD; 09/10/2016 1:51 PM)  General Mental Status-Alert. General Appearance-Consistent with stated age. Hydration-Well hydrated. Voice-Normal.  Head and Neck Head-normocephalic, atraumatic with no lesions or palpable masses. Trachea-midline. Thyroid Gland Characteristics - normal size and consistency.  Eye Eyeball - Bilateral-Extraocular movements intact. Sclera/Conjunctiva - Bilateral-No scleral icterus.  Chest and Lung Exam Chest and lung exam reveals -quiet, even and easy respiratory effort with no use of accessory muscles and on auscultation, normal breath sounds, no adventitious sounds and normal vocal resonance. Inspection Chest Wall - Normal. Back - normal.  Breast Breast - Left-Symmetric, Non Tender, No Biopsy scars, no Dimpling, No Inflammation, No Lumpectomy scars, No Mastectomy scars, No Peau d' Orange. Breast - Right-Symmetric, Non Tender, No Biopsy scars, no Dimpling, No Inflammation, No Lumpectomy scars, No Mastectomy scars, No Peau d' Orange. Breast Lump-No Palpable Breast Mass.  Cardiovascular Cardiovascular examination reveals -normal heart sounds, regular rate  and rhythm with no murmurs and normal pedal pulses bilaterally.  Abdomen Inspection Inspection of Terry abdomen reveals - No Hernias. Skin - Scar - no surgical scars. Palpation/Percussion Palpation and Percussion of Terry abdomen reveal - Soft, Non Tender, No Rebound tenderness, No Rigidity (guarding) and No hepatosplenomegaly. Auscultation Auscultation of Terry abdomen reveals - Bowel sounds normal.  Neurologic Neurologic evaluation reveals -alert and oriented x 3 with no impairment of recent or remote memory. Mental Status-Normal.  Musculoskeletal Normal Exam - Left-Upper Extremity Strength Normal and Lower Extremity Strength Normal. Normal Exam - Right-Upper Extremity Strength Normal and Lower Extremity Strength Normal.  Lymphatic Head & Neck  General Head & Neck Lymphatics: Bilateral - Description - Normal. Axillary  General Axillary Region: Bilateral - Description - Normal. Tenderness - Non Tender. Femoral & Inguinal  Generalized Femoral & Inguinal Lymphatics: Bilateral - Description - Normal. Tenderness - Non Tender.    Assessment & Plan (Vinia Jemmott A. Yuta Cipollone MD; 09/10/2016 1:55 PM)  BREAST CANCER, LEFT (C50.912) Impression: Risk of lumpectomy include bleeding, infection, seroma, more surgery, use of seed/wire, wound care, cosmetic deformity and Terry need for other treatments, death , blood clots, death. Pt agrees to proceed. Risk of sentinel lymph node mapping include bleeding, infection, lymphedema, shoulder pain. stiffness, dye allergy. cosmetic deformity , blood clots, death, need for more surgery. Pt agres to proceed. Stage I left breast cancer.  Current Plans You are being scheduled for surgery- Our schedulers will call you.  You should hear from our office'Mcneil scheduling department within 5 working days about Terry location, date, and time of surgery. We try to make accommodations for patient'Mcneil preferences in scheduling surgery, but sometimes Terry OR schedule or Terry  surgeon'Mcneil schedule prevents [place] from making those accommodations.  If you have not heard from our office [phone]) in 5 working days, call Terry office and ask for your surgeon'Mcneil nurse.  If you have other questions about your diagnosis, plan, or surgery, call Terry office and ask for your surgeon'Mcneil nurse.  Pt Education - [facility] Breast Cancer Information Given - [facility] "Breast Journey" Package Pt Education - Pamphlet Given - Breast Biopsy: discussed with patient and provided information. We discussed Terry staging and pathophysiology of breast cancer. We discussed all of Terry different options for treatment for breast cancer including surgery, chemotherapy, radiation therapy, Herceptin, and antiestrogen therapy. We discussed a sentinel lymph node biopsy as she does not appear to having lymph node involvement right now. We discussed Terry performance of that with injection of radioactive tracer and blue dye. We discussed that she would have an incision underneath her axillary hairline. We discussed that there is a bout a 10-20% chance of having a positive node with a sentinel lymph node biopsy and we will await Terry permanent pathology to make any other first further decisions in terms of her treatment. One of these options might be to return to Terry operating room to perform an axillary lymph node dissection. We discussed about a 1-2% risk lifetime of chronic shoulder pain as well as lymphedema associated with a sentinel lymph node biopsy. We discussed Terry options for treatment of Terry breast cancer which included lumpectomy versus a mastectomy. We discussed Terry performance of Terry lumpectomy with a wire placement. We discussed a 10-20% chance of a positive margin requiring reexcision in Terry operating room. We also discussed that she may need radiation therapy or antiestrogen therapy or both if she undergoes lumpectomy. We discussed Terry mastectomy and Terry postoperative care for that as well. We discussed that there is  no difference in her survival whether she undergoes lumpectomy with radiation therapy or antiestrogen therapy versus a mastectomy. There is a slight difference in Terry local recurrence rate being 3-5% with lumpectomy and about 1% with a mastectomy. We discussed Terry risks of operation including bleeding, infection, possible reoperation. She understands her further therapy will be based on what her stages at Terry time of her operation.  Pt Education - flb breast cancer surgery: discussed with patient and provided information. Pt Education - [facility] Breast Biopsy HCI: discussed with patient and provided information. Pt Education - ABC (After Breast Cancer) Class Info: discussed with patient and provided information. Referred to Radiation Oncology, for evaluation and follow up (Radiation Oncology). Routine. Referred to Oncology, for evaluation and follow up (Oncology). Routine.

## 2016-09-13 ENCOUNTER — Encounter: Payer: Self-pay | Admitting: Radiation Oncology

## 2016-09-16 ENCOUNTER — Other Ambulatory Visit: Payer: Self-pay | Admitting: Surgery

## 2016-09-16 DIAGNOSIS — C50412 Malignant neoplasm of upper-outer quadrant of left female breast: Secondary | ICD-10-CM

## 2016-09-19 NOTE — Progress Notes (Signed)
Location of Breast Cancer:Left Breast 1:30 o'clock, Upper Outer Quadrant  Histology per Pathology Report: Diagnosis 09/06/2016: Breast, left, needle core biopsy, 1:30 o'clock - INVASIVE MAMMARY CARCINOMA  Receptor Status: ER(100%+), PR (5%+), Her2-neu-neg  Ki-67(5%)  Did patient present with symptoms (if so, please note symptoms) or was this found on screening mammography?: Routine screening  Past/Anticipated interventions by surgeon, if any:Dr. Erroll Luna, MD surgery scheduled 10/03/16  Past/Anticipated interventions by medical oncology, if any: No  Lymphedema issues, if any:   No  Pain issues, if any: NO  SAFETY ISSUES: NO  Prior radiation? NO  Pacemaker/ICD? NO     Is the patient on methotrexate? NO  Current Complaints / other details:  Menarche age 75, G69P2, former cigarette smoker, moderate alcohol use, no drug use, Father Heart disease,Mother HTN,migraines, no familh hx  Cancer  Allergies:NKA   BP (!) 158/84 (BP Location: Right Arm, Patient Position: Sitting, Cuff Size: Normal)   Pulse 81   Temp 98 F (36.7 C) (Oral)   Resp 20   Ht 5' 5.5" (1.664 m)   Wt 159 lb 3.2 oz (72.2 kg)   BMI 26.09 kg/m   Wt Readings from Last 3 Encounters:  09/25/16 159 lb 3.2 oz (72.2 kg)  08/21/16 156 lb (70.8 kg)  04/16/16 156 lb 12.8 oz (71.1 kg)   Rebecca Eaton, RN 09/19/2016,4:11 PM

## 2016-09-25 ENCOUNTER — Telehealth: Payer: Self-pay | Admitting: *Deleted

## 2016-09-25 ENCOUNTER — Ambulatory Visit
Admission: RE | Admit: 2016-09-25 | Discharge: 2016-09-25 | Disposition: A | Discharge status: Home / Self Care | Payer: Medicare Other | Source: Ambulatory Visit | Attending: Radiation Oncology | Admitting: Radiation Oncology

## 2016-09-25 ENCOUNTER — Encounter: Payer: Self-pay | Admitting: Radiation Oncology

## 2016-09-25 VITALS — BP 158/84 | HR 81 | Temp 98.0°F | Resp 20 | Ht 65.5 in | Wt 159.2 lb

## 2016-09-25 DIAGNOSIS — Z7982 Long term (current) use of aspirin: Secondary | ICD-10-CM | POA: Insufficient documentation

## 2016-09-25 DIAGNOSIS — M810 Age-related osteoporosis without current pathological fracture: Secondary | ICD-10-CM | POA: Insufficient documentation

## 2016-09-25 DIAGNOSIS — Z86010 Personal history of colonic polyps: Secondary | ICD-10-CM | POA: Insufficient documentation

## 2016-09-25 DIAGNOSIS — Z8249 Family history of ischemic heart disease and other diseases of the circulatory system: Secondary | ICD-10-CM | POA: Insufficient documentation

## 2016-09-25 DIAGNOSIS — Z9889 Other specified postprocedural states: Secondary | ICD-10-CM | POA: Diagnosis not present

## 2016-09-25 DIAGNOSIS — H269 Unspecified cataract: Secondary | ICD-10-CM | POA: Insufficient documentation

## 2016-09-25 DIAGNOSIS — N3941 Urge incontinence: Secondary | ICD-10-CM | POA: Diagnosis not present

## 2016-09-25 DIAGNOSIS — I10 Essential (primary) hypertension: Secondary | ICD-10-CM | POA: Insufficient documentation

## 2016-09-25 DIAGNOSIS — Z9851 Tubal ligation status: Secondary | ICD-10-CM | POA: Diagnosis not present

## 2016-09-25 DIAGNOSIS — Z170 Estrogen receptor positive status [ER+]: Secondary | ICD-10-CM | POA: Insufficient documentation

## 2016-09-25 DIAGNOSIS — C50412 Malignant neoplasm of upper-outer quadrant of left female breast: Secondary | ICD-10-CM | POA: Insufficient documentation

## 2016-09-25 DIAGNOSIS — Z87891 Personal history of nicotine dependence: Secondary | ICD-10-CM | POA: Insufficient documentation

## 2016-09-25 NOTE — Progress Notes (Signed)
Please see the Nurse Progress Note in the MD Initial Consult Encounter for this patient. 

## 2016-09-25 NOTE — Progress Notes (Signed)
Radiation Oncology         [phone] ________________________________  Name: Terry Mcneil MRN: [id number]  Date: 09/25/2016  DOB: 07/26/1940  CC:Mcneil, Terry Sciara, NP  Erroll Luna, MD     REFERRING PHYSICIAN: Erroll Luna, MD   DIAGNOSIS: The encounter diagnosis was Malignant neoplasm of upper-outer quadrant of left female breast, unspecified estrogen receptor status ([name]).   HISTORY OF PRESENT ILLNESS: Terry Mcneil is a 76 y.o. female seen at the request of Dr. Brantley Stage for a new diagnosis of left breast cancer. On 08/29/16, she was found to have a 4 x 4 x 4 mm mass on screening mammogram in the left breast, upper outer quadrant. No suspicious adenopathy was noted. Ultrasound on 09/04/16 showed a suspicious mass in the left breast at the 1:30 position and a slightly prominent left axillary lymph node which appears otherwise morphologically normal. A breast biopsy on 09/06/16 revealed ER/PR positive, HER2 negative invasive ductal carcinoma with Ki67 of 5%. The patient has been scheduled for a left breast lumpectomy on 10/03/16.   PREVIOUS RADIATION THERAPY: No   PAST MEDICAL HISTORY:  Past Medical History:  Diagnosis Date  . Arthritis   . Cataract   . Colon polyps   . Hypertension   . Osteopenia 10/11/2009  . Patellar fracture    Right Knee   . Urge incontinence        PAST SURGICAL HISTORY: Past Surgical History:  Procedure Laterality Date  . EYE SURGERY Bilateral    Cataracts   . ORIF PATELLA Right 08/28/2012   Procedure: OPEN REDUCTION INTERNAL (ORIF) FIXATION PATELLA- RIGHT;  Surgeon: Newt Minion, MD;  Location: [facility];  Service: Orthopedics;  Laterality: Right;  Open Reduction Internal Fixation Right Patella  . TONSILLECTOMY    . TUBAL LIGATION    . VAGINAL DELIVERY     x2 -      FAMILY HISTORY:  Family History  Problem Relation Age of Onset  . Heart disease Father 59       valve replacement     SOCIAL HISTORY:  reports that she quit smoking  about 38 years ago. She has never used smokeless tobacco. She reports that she drinks alcohol. She reports that she does not use drugs. The patient is married and lives in [place]. 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   ALLERGIES: Patient has no known allergies.   MEDICATIONS:  Current Outpatient Prescriptions  Medication Sig Dispense Refill  . aspirin EC 81 MG tablet Take 81 mg by mouth daily.    Marland Kitchen atorvastatin (LIPITOR) 20 MG tablet Take 0.5 tablets (10 mg total) by mouth daily. 45 tablet 1  . calcium-vitamin D (OSCAL WITH D) 500-200 MG-UNIT per tablet Take 1 tablet by mouth 2 (two) times daily.    . carvedilol (COREG) 6.25 MG tablet Take 1/2 tablet twice a day. 90 tablet 1  . lisinopril-hydrochlorothiazide (PRINZIDE,ZESTORETIC) 20-25 MG tablet Take 1 tablet by mouth daily. 90 tablet 1  . Multiple Vitamin (MULTIVITAMIN WITH MINERALS) TABS Take 1 tablet by mouth daily.    Marland Kitchen tolterodine (DETROL LA) 4 MG 24 hr capsule TAKE 1 CAPSULE (4 MG TOTAL) BY MOUTH DAILY. 90 capsule 1   No current facility-administered medications for this encounter.      REVIEW OF SYSTEMS: On review of systems, the patient reports that she is doing well overall. She denies any chest pain, shortness of breath, cough, fevers, chills, night sweats,  unintended weight changes. She denies any bowel or bladder disturbances, and denies abdominal pain, nausea or vomiting. She denies any new musculoskeletal or joint aches or pains. A complete review of systems is obtained and is otherwise negative.   He probably is PHYSICAL EXAM:  Wt Readings from Last 3 Encounters:  08/21/16 156 lb (70.8 kg)  04/16/16 156 lb 12.8 oz (71.1 kg)  01/16/16 156 lb 9.6 oz (71 kg)   Temp Readings from Last 3 Encounters:  08/21/16 98.1 F (36.7 C) (Oral)  01/16/16 98 F (36.7 C) (Oral)  09/15/15 98.2 F (36.8 C) (Oral)   BP Readings from Last 3 Encounters:  08/21/16 (!) 141/78   04/16/16 122/80  01/16/16 117/80   Pulse Readings from Last 3 Encounters:  08/21/16 91  04/16/16 78  01/16/16 75    /10  In general this is a well appearing caucasian woman in no acute distress. [name] alert and oriented x4 and appropriate throughout the examination. HEENT reveals that the patient is normocephalic, atraumatic. EOMs are intact. PERRLA. Skin is intact without any evidence of gross lesions. Cardiovascular exam reveals a regular rate and rhythm, no clicks rubs or murmurs are auscultated. Chest is clear to auscultation bilaterally. Breast exam shows no palpable masses in the right or left breast. Minimal ecchymotic changes noted in the left breast from the biopsy. Lymphatic assessment is performed and does not reveal any adenopathy in the cervical, supraclavicular, axillary, or inguinal chains. Abdomen has active bowel sounds in all quadrants and is intact. The abdomen is soft, non tender, non distended. Lower extremities are negative for pretibial pitting edema, deep calf tenderness, cyanosis or clubbing.    ECOG = 0  0 - Asymptomatic (Fully active, able to carry on all predisease activities without restriction)  1 - Symptomatic but completely ambulatory (Restricted in physically strenuous activity but ambulatory and able to carry out work of a light or sedentary nature. For example, light housework, office work)  2 - Symptomatic, <50% in bed during the day (Ambulatory and capable of all self care but unable to carry out any work activities. Up and about more than 50% of waking hours)  3 - Symptomatic, >50% in bed, but not bedbound (Capable of only limited self-care, confined to bed or chair 50% or more of waking hours)  4 - Bedbound (Completely disabled. Cannot carry on any self-care. Totally confined to bed or chair)  5 - Death   Eustace Pen MM, Creech RH, Tormey DC, et al. [phone]). "Toxicity and response criteria of the [facility]". [place] Oncol. 5  (6): 649-55    LABORATORY DATA:  Lab Results  Component Value Date   WBC 8.4 08/17/2015   HGB 13.6 08/17/2015   HCT 39.9 08/17/2015   MCV 94.8 08/17/2015   PLT 238 08/17/2015   Lab Results  Component Value Date   NA 138 08/21/2016   K 3.9 08/21/2016   CL 101 08/21/2016   CO2 27 08/21/2016   Lab Results  Component Value Date   ALT 17 02/07/2014   AST 26 02/07/2014   ALKPHOS 66 02/07/2014   BILITOT 0.9 02/07/2014      RADIOGRAPHY: US Breast Ltd Uni Left Inc Axilla  Result Date: 09/04/2016 CLINICAL DATA:  76 year old female presenting for screening recall of a possible left breast mass. EXAM: 2D DIGITAL DIAGNOSTIC UNILATERAL LEFT MAMMOGRAM WITH CAD AND ADJUNCT TOMO LEFT BREAST ULTRASOUND COMPARISON:  Previous exam(s). ACR Breast Density Category b: There are scattered areas  of fibroglandular density. FINDINGS: In the upper-outer quadrant of the left breast, posterior depth, there is a persistent asymmetry, which on the MLO view appears to be a 4 mm possibly spiculated mass. Ultrasound will be performed for further evaluation. Mammographic images were processed with CAD. No definite palpable masses are identified in the upper-outer quadrant of the left breast. Ultrasound targeted to the left breast at 1:30, 4 cm from the nipple demonstrates a small hypoechoic irregular shadowing mass measuring 4 x 4 x 4 mm, corresponding in size and position with the mammographic finding of concern. This is located near the benign-appearing lymph node seen on ultrasound a year ago. Ultrasound of the left axilla demonstrates multiple normal-appearing lymph nodes. There is 1 prominent lymph node with a cortex measuring just under 4 mm, however there is no suspicious focal bulge and the fatty hilum is preserved. IMPRESSION: 1.  There is a suspicious mass in the left breast at 1:30. 2. There is a slightly prominent left axillary lymph node which appears otherwise morphologically normal. RECOMMENDATION:  Ultrasound-guided biopsy is recommended for the 4 mm mass in the left breast at 1:30. I have discussed the findings and recommendations with the patient. Results were also provided in writing at the conclusion of the visit. If applicable, a reminder letter will be sent to the patient regarding the next appointment. BI-RADS CATEGORY  4: Suspicious. Electronically Signed   By: Ammie Ferrier M.D.   On: 09/04/2016 12:40   Mm Diag Breast Tomo Uni Left  Result Date: 09/04/2016 CLINICAL DATA:  76 year old female presenting for screening recall of a possible left breast mass. EXAM: 2D DIGITAL DIAGNOSTIC UNILATERAL LEFT MAMMOGRAM WITH CAD AND ADJUNCT TOMO LEFT BREAST ULTRASOUND COMPARISON:  Previous exam(s). ACR Breast Density Category b: There are scattered areas of fibroglandular density. FINDINGS: In the upper-outer quadrant of the left breast, posterior depth, there is a persistent asymmetry, which on the MLO view appears to be a 4 mm possibly spiculated mass. Ultrasound will be performed for further evaluation. Mammographic images were processed with CAD. No definite palpable masses are identified in the upper-outer quadrant of the left breast. Ultrasound targeted to the left breast at 1:30, 4 cm from the nipple demonstrates a small hypoechoic irregular shadowing mass measuring 4 x 4 x 4 mm, corresponding in size and position with the mammographic finding of concern. This is located near the benign-appearing lymph node seen on ultrasound a year ago. Ultrasound of the left axilla demonstrates multiple normal-appearing lymph nodes. There is 1 prominent lymph node with a cortex measuring just under 4 mm, however there is no suspicious focal bulge and the fatty hilum is preserved. IMPRESSION: 1.  There is a suspicious mass in the left breast at 1:30. 2. There is a slightly prominent left axillary lymph node which appears otherwise morphologically normal. RECOMMENDATION: Ultrasound-guided biopsy is recommended for  the 4 mm mass in the left breast at 1:30. I have discussed the findings and recommendations with the patient. Results were also provided in writing at the conclusion of the visit. If applicable, a reminder letter will be sent to the patient regarding the next appointment. BI-RADS CATEGORY  4: Suspicious. Electronically Signed   By: Ammie Ferrier M.D.   On: 09/04/2016 12:40   Mm Screening Breast Tomo Bilateral  Result Date: 08/29/2016 CLINICAL DATA:  Screening. EXAM: 2D DIGITAL SCREENING BILATERAL MAMMOGRAM WITH CAD AND ADJUNCT TOMO COMPARISON:  Previous exam(s). ACR Breast Density Category b: There are scattered areas of fibroglandular density. FINDINGS:  In the left breast, a possible spiculated mass warrants further evaluation. In the right breast, no findings suspicious for malignancy. Images were processed with CAD. IMPRESSION: Further evaluation is suggested for possible spiculated mass in the left breast. RECOMMENDATION: Diagnostic mammogram and possibly ultrasound of the left breast. (Code:[id number]) The patient will be contacted regarding the findings, and additional imaging will be scheduled. BI-RADS CATEGORY  0: Incomplete. Need additional imaging evaluation and/or prior mammograms for comparison. Electronically Signed   By: Ammie Ferrier M.D.   On: 08/29/2016 16:41   Mm Clip Placement Left  Result Date: 09/06/2016 CLINICAL DATA:  Post biopsy mammogram of the left breast for clip placement. EXAM: DIAGNOSTIC LEFT MAMMOGRAM POST ULTRASOUND BIOPSY COMPARISON:  Previous exam(s). FINDINGS: Mammographic images were obtained following ultrasound guided biopsy of left breast mass at 1:30. The ribbon shaped biopsy marking clip is appropriately positioned at the intended site of biopsy at 1:30 in the left breast. IMPRESSION: Appropriate positioning of the ribbon shaped biopsy marking clip in the left breast at 1:30. Final Assessment: Post Procedure Mammograms for Marker Placement Electronically Signed    By: Ammie Ferrier M.D.   On: 09/06/2016 15:41   [place] Lt Breast Bx W Loc Dev 1st Lesion Img Bx Spec US Guide  Addendum Date: 09/09/2016   ADDENDUM REPORT: 09/09/2016 14:26 ADDENDUM: Pathology revealed GRADE I INVASIVE MAMMARY CARCINOMA of the Left breast, 1:30 o'clock. This was found to be concordant by Dr. Ammie Ferrier. Pathology results were discussed with the patient by telephone. The patient reported doing well after the biopsy with tenderness at the site. Post biopsy instructions and care were reviewed and questions were answered. The patient was encouraged to call [facility] for any additional concerns. Surgical consultation has been arranged with Dr. Erroll Luna at [facility] on Sep 10, 2016. Pathology results reported by Terie Purser, RN on 09/09/2016. Electronically Signed   By: Ammie Ferrier M.D.   On: 09/09/2016 14:26   Result Date: 09/09/2016 CLINICAL DATA:  76 year old female presenting for ultrasound-guided biopsy of a left breast mass. EXAM: ULTRASOUND GUIDED LEFT BREAST CORE NEEDLE BIOPSY COMPARISON:  Previous exam(s). PROCEDURE: I met with the patient and we discussed the procedure of ultrasound-guided biopsy, including benefits and alternatives. We discussed the high likelihood of a successful procedure. We discussed the risks of the procedure including infection, bleeding, tissue injury, clip migration, and inadequate sampling. Informed written consent was given. The usual time-out protocol was performed immediately prior to the procedure. Lesion quadrant: Upper-outer Using sterile technique and 1% Lidocaine as local anesthetic, under direct ultrasound visualization, a 12 gauge spring-loaded device was used to perform biopsy of a left breast mass at 1:30using a lateral approach. At the conclusion of the procedure, a ribbon shaped tissue marker clip was deployed into the biopsy cavity. Follow-up 2-view mammogram was performed and dictated  separately. IMPRESSION: Ultrasound-guided biopsy of left breast mass at 1:30. No apparent complications. Electronically Signed: By: Ammie Ferrier M.D. On: 09/06/2016 15:42       IMPRESSION/PLAN: 1. Grade I, ER/PR positive, invasive ductal carcinoma of the left breast. Dr. Lisbeth Renshaw discusses the pathology findings and reviews the nature of invasive breast disease. [name] planning breast conservation with lumpectomy with sentinel mapping on 10/03/16. We discussed the literature on patients with small tumors, early disease, and low grade/proliferation in patients over the age of 17. We reviewed her interest in being aggressive in her treatment, and in doing so weighed the risks and benefits  of treatment versus no treatment with radiation therapy to the left breast. We discussed the importance of meeting with medical oncology to determine the role of an antiestrogen therapy to be given as well. The patient would like a referral to medical oncology in [facility]. She has inticated an interest in considering radiotherapy and we discussed the risks, benefits, short, and long term effects of this treatment, and the patient is interested in proceeding. Dr. Lisbeth Renshaw discusses the delivery and logistics of radiotherapy and would offer the patient a course of 4 weeks of treatment with deep inspiration breath hold techniques. We will see her back in about one month to discuss her surgical pathology and her decisions regarding treatment. She states agreement and understanding.   The above documentation reflects my direct findings during this shared patient visit. Please see the separate note by Dr. Lisbeth Renshaw on this date for the remainder of the patient's plan of care.    Carola Rhine, PAC  This document serves as a record of services personally performed by Kyung Rudd, MD and Shona Simpson, PA-C. It was created on their behalf by Bethann Humble, a trained medical scribe. The creation of this record is based on the  scribe's personal observations and the provider's statements to them. This document has been checked and approved by the attending provider.

## 2016-09-25 NOTE — Telephone Encounter (Signed)
CALLED PATIENT TO INFORM OF [facility] APPT. FOR 11/05/16 , LVM FOR A RETURN CALL

## 2016-09-26 ENCOUNTER — Encounter (HOSPITAL_BASED_OUTPATIENT_CLINIC_OR_DEPARTMENT_OTHER): Payer: Self-pay | Admitting: *Deleted

## 2016-09-30 ENCOUNTER — Encounter (HOSPITAL_BASED_OUTPATIENT_CLINIC_OR_DEPARTMENT_OTHER)
Admission: RE | Admit: 2016-09-30 | Discharge: 2016-09-30 | Disposition: A | Discharge status: Home / Self Care | Payer: Medicare Other | Source: Ambulatory Visit | Attending: Surgery | Admitting: Surgery

## 2016-09-30 DIAGNOSIS — Z170 Estrogen receptor positive status [ER+]: Secondary | ICD-10-CM | POA: Diagnosis not present

## 2016-09-30 DIAGNOSIS — Z79899 Other long term (current) drug therapy: Secondary | ICD-10-CM | POA: Diagnosis not present

## 2016-09-30 DIAGNOSIS — C50412 Malignant neoplasm of upper-outer quadrant of left female breast: Secondary | ICD-10-CM | POA: Diagnosis not present

## 2016-09-30 DIAGNOSIS — I10 Essential (primary) hypertension: Secondary | ICD-10-CM | POA: Diagnosis not present

## 2016-09-30 DIAGNOSIS — Z87891 Personal history of nicotine dependence: Secondary | ICD-10-CM | POA: Diagnosis not present

## 2016-09-30 LAB — COMPREHENSIVE METABOLIC PANEL
ALBUMIN: 4.1 g/dL (ref 3.5–5.0)
ALT: 18 U/L (ref 14–54)
ANION GAP: 8 (ref 5–15)
AST: 23 U/L (ref 15–41)
Alkaline Phosphatase: 75 U/L (ref 38–126)
BILIRUBIN TOTAL: 1.1 mg/dL (ref 0.3–1.2)
BUN: 31 mg/dL — ABNORMAL HIGH (ref 6–20)
CO2: 28 mmol/L (ref 22–32)
Calcium: 9.8 mg/dL (ref 8.9–10.3)
Chloride: 102 mmol/L (ref 101–111)
Creatinine, Ser: 0.83 mg/dL (ref 0.44–1.00)
GFR calc Af Amer: >60 mL/min (ref >60)
GFR calc non Af Amer: >60 mL/min (ref >60)
GLUCOSE: 103 mg/dL — AB (ref 65–99)
POTASSIUM: 4.7 mmol/L (ref 3.5–5.1)
Sodium: 138 mmol/L (ref 135–145)
Total Protein: 6.6 g/dL (ref 6.5–8.1)

## 2016-09-30 LAB — CBC WITH DIFFERENTIAL/PLATELET
BASOS PCT: 1 %
Basophils Absolute: 0 10*3/uL (ref 0.0–0.1)
Eosinophils Absolute: 0.5 10*3/uL (ref 0.0–0.7)
Eosinophils Relative: 7 %
HEMATOCRIT: 38.5 % (ref 36.0–46.0)
Hemoglobin: 12.8 g/dL (ref 12.0–15.0)
Lymphocytes Relative: 25 %
Lymphs Abs: 1.8 10*3/uL (ref 0.7–4.0)
MCH: 31.4 pg (ref 26.0–34.0)
MCHC: 33.2 g/dL (ref 30.0–36.0)
MCV: 94.4 fL (ref 78.0–100.0)
MONO ABS: 0.6 10*3/uL (ref 0.1–1.0)
MONOS PCT: 8 %
NEUTROS ABS: 4.2 10*3/uL (ref 1.7–7.7)
Neutrophils Relative %: 59 %
Platelets: 236 10*3/uL (ref 150–400)
RBC: 4.08 MIL/uL (ref 3.87–5.11)
RDW: 12.9 % (ref 11.5–15.5)
WBC: 7 10*3/uL (ref 4.0–10.5)

## 2016-09-30 NOTE — Progress Notes (Addendum)
EKG done and labs drawn, Boost drink given with instructions to complete by 0400, Hibiclens soap given with instructions, pt verbalized understanding.  Labs reviewed by Dr. Smith Robert, no change in plan.

## 2016-10-01 ENCOUNTER — Ambulatory Visit
Admission: RE | Admit: 2016-10-01 | Discharge: 2016-10-01 | Disposition: A | Discharge status: Home / Self Care | Payer: Medicare Other | Source: Ambulatory Visit | Attending: Surgery | Admitting: Surgery

## 2016-10-01 ENCOUNTER — Telehealth: Payer: Self-pay | Admitting: *Deleted

## 2016-10-01 DIAGNOSIS — C50412 Malignant neoplasm of upper-outer quadrant of left female breast: Secondary | ICD-10-CM

## 2016-10-01 NOTE — Telephone Encounter (Signed)
CALLED PATIENT TO INFORM OF APPT. WITH DR. KHAN ON 10-16-16 - ARRIVAL TIME - 10:45 AM, ADDRESS- [address] [place]. - [phone], LVM FOR A RETURN CALL

## 2016-10-02 NOTE — Anesthesia Preprocedure Evaluation (Addendum)
Anesthesia Evaluation  Patient identified by MRN, date of birth, ID band Patient awake    Reviewed: Allergy & Precautions, NPO status , Patient's Chart, lab work & pertinent test results  Airway Mallampati: II  TM Distance: >3 FB Neck ROM: Full    Dental  (+) Dental Advisory Given   Pulmonary former smoker,    breath sounds clear to auscultation       Cardiovascular hypertension, Pt. on medications and Pt. on home beta blockers  Rhythm:Regular Rate:Normal     Neuro/Psych negative neurological ROS     GI/Hepatic negative GI ROS, Neg liver ROS,   Endo/Other  negative endocrine ROS  Renal/GU negative Renal ROS     Musculoskeletal  (+) Arthritis ,   Abdominal   Peds  Hematology negative hematology ROS (+)   Anesthesia Other Findings   Reproductive/Obstetrics                            Lab Results  Component Value Date   WBC 7.0 09/30/2016   HGB 12.8 09/30/2016   HCT 38.5 09/30/2016   MCV 94.4 09/30/2016   PLT 236 09/30/2016   Lab Results  Component Value Date   CREATININE 0.83 09/30/2016   BUN 31 (H) 09/30/2016   NA 138 09/30/2016   K 4.7 09/30/2016   CL 102 09/30/2016   CO2 28 09/30/2016    Anesthesia Physical Anesthesia Plan  ASA: II  Anesthesia Plan: General   Post-op Pain Management:  Regional for Post-op pain   Induction: Intravenous  Airway Management Planned: LMA  Additional Equipment:   Intra-op Plan:   Post-operative Plan: Extubation in OR  Informed Consent: I have reviewed the patients History and Physical, chart, labs and discussed the procedure including the risks, benefits and alternatives for the proposed anesthesia with the patient or authorized representative who has indicated his/her understanding and acceptance.   Dental advisory given  Plan Discussed with:   Anesthesia Plan Comments:        Anesthesia Quick Evaluation

## 2016-10-03 ENCOUNTER — Ambulatory Visit (HOSPITAL_BASED_OUTPATIENT_CLINIC_OR_DEPARTMENT_OTHER)
Admission: RE | Admit: 2016-10-03 | Discharge: 2016-10-03 | Disposition: A | Discharge status: Home / Self Care | Payer: Medicare Other | Source: Ambulatory Visit | Attending: Surgery | Admitting: Surgery

## 2016-10-03 ENCOUNTER — Encounter (HOSPITAL_BASED_OUTPATIENT_CLINIC_OR_DEPARTMENT_OTHER): Payer: Self-pay | Admitting: *Deleted

## 2016-10-03 ENCOUNTER — Ambulatory Visit (HOSPITAL_BASED_OUTPATIENT_CLINIC_OR_DEPARTMENT_OTHER): Payer: Medicare Other | Admitting: Anesthesiology

## 2016-10-03 ENCOUNTER — Ambulatory Visit
Admission: RE | Admit: 2016-10-03 | Discharge: 2016-10-03 | Disposition: A | Discharge status: Home / Self Care | Payer: Medicare Other | Source: Ambulatory Visit | Attending: Surgery | Admitting: Surgery

## 2016-10-03 ENCOUNTER — Encounter (HOSPITAL_BASED_OUTPATIENT_CLINIC_OR_DEPARTMENT_OTHER)
Admission: RE | Disposition: A | Discharge status: Home / Self Care | Payer: Self-pay | Source: Ambulatory Visit | Attending: Surgery

## 2016-10-03 ENCOUNTER — Encounter (HOSPITAL_COMMUNITY)
Admission: RE | Admit: 2016-10-03 | Discharge: 2016-10-03 | Disposition: A | Discharge status: Home / Self Care | Payer: Medicare Other | Source: Ambulatory Visit | Attending: Surgery | Admitting: Surgery

## 2016-10-03 DIAGNOSIS — C50412 Malignant neoplasm of upper-outer quadrant of left female breast: Secondary | ICD-10-CM | POA: Diagnosis not present

## 2016-10-03 DIAGNOSIS — Z170 Estrogen receptor positive status [ER+]: Secondary | ICD-10-CM | POA: Diagnosis not present

## 2016-10-03 DIAGNOSIS — Z87891 Personal history of nicotine dependence: Secondary | ICD-10-CM | POA: Diagnosis not present

## 2016-10-03 DIAGNOSIS — I10 Essential (primary) hypertension: Secondary | ICD-10-CM | POA: Diagnosis not present

## 2016-10-03 DIAGNOSIS — Z79899 Other long term (current) drug therapy: Secondary | ICD-10-CM | POA: Insufficient documentation

## 2016-10-03 HISTORY — DX: Malignant (primary) neoplasm, unspecified: C80.1

## 2016-10-03 HISTORY — PX: RADIOACTIVE SEED GUIDED PARTIAL MASTECTOMY WITH AXILLARY SENTINEL LYMPH NODE BIOPSY: SHX6520

## 2016-10-03 SURGERY — RADIOACTIVE SEED GUIDED PARTIAL MASTECTOMY WITH AXILLARY SENTINEL LYMPH NODE BIOPSY
Anesthesia: General | Laterality: Left

## 2016-10-03 MED ORDER — FENTANYL CITRATE (PF) 100 MCG/2ML IJ SOLN
INTRAMUSCULAR | Status: AC
  Filled 2016-10-03: qty 2

## 2016-10-03 MED ORDER — LIDOCAINE 2% (20 MG/ML) 5 ML SYRINGE
INTRAMUSCULAR | Status: DC | PRN
  Administered 2016-10-03: 60 mg via INTRAVENOUS

## 2016-10-03 MED ORDER — DEXAMETHASONE SODIUM PHOSPHATE 4 MG/ML IJ SOLN
INTRAMUSCULAR | Status: DC | PRN
  Administered 2016-10-03: 10 mg via INTRAVENOUS

## 2016-10-03 MED ORDER — METHYLENE BLUE 0.5 % INJ SOLN
INTRAVENOUS | Status: AC
  Filled 2016-10-03: qty 10

## 2016-10-03 MED ORDER — DEXTROSE 5 % IV SOLN: 3.0000 g | INTRAVENOUS | Status: DC

## 2016-10-03 MED ORDER — MIDAZOLAM HCL 2 MG/2ML IJ SOLN
INTRAMUSCULAR | Status: AC
  Filled 2016-10-03: qty 2

## 2016-10-03 MED ORDER — CHLORHEXIDINE GLUCONATE CLOTH 2 % EX PADS: 6.0000 | MEDICATED_PAD | Freq: Once | CUTANEOUS | Status: DC

## 2016-10-03 MED ORDER — TECHNETIUM TC 99M SULFUR COLLOID FILTERED
1.0000 | Freq: Once | INTRAVENOUS | Status: AC | PRN
  Administered 2016-10-03: 1 via INTRADERMAL

## 2016-10-03 MED ORDER — ACETAMINOPHEN 500 MG PO TABS
1000.0000 mg | ORAL_TABLET | ORAL | Status: AC
  Administered 2016-10-03: 1000 mg via ORAL

## 2016-10-03 MED ORDER — PROMETHAZINE HCL 25 MG/ML IJ SOLN: 6.2500 mg | INTRAMUSCULAR | Status: DC | PRN

## 2016-10-03 MED ORDER — CEFAZOLIN SODIUM-DEXTROSE 2-3 GM-% IV SOLR
INTRAVENOUS | Status: DC | PRN
  Administered 2016-10-03: 2 g via INTRAVENOUS

## 2016-10-03 MED ORDER — OXYCODONE HCL 5 MG PO TABS: 5.0000 mg | ORAL_TABLET | Freq: Four times a day (QID) | ORAL | 0 refills | Status: DC | PRN

## 2016-10-03 MED ORDER — FENTANYL CITRATE (PF) 100 MCG/2ML IJ SOLN
25.0000 ug | INTRAMUSCULAR | Status: DC | PRN
  Administered 2016-10-03: 50 ug via INTRAVENOUS

## 2016-10-03 MED ORDER — LACTATED RINGERS IV SOLN
INTRAVENOUS | Status: DC
  Administered 2016-10-03 (×2): via INTRAVENOUS

## 2016-10-03 MED ORDER — EPHEDRINE SULFATE 50 MG/ML IJ SOLN
INTRAMUSCULAR | Status: DC | PRN
  Administered 2016-10-03 (×3): 10 mg via INTRAVENOUS
  Administered 2016-10-03 (×2): 5 mg via INTRAVENOUS

## 2016-10-03 MED ORDER — MIDAZOLAM HCL 2 MG/2ML IJ SOLN
1.0000 mg | INTRAMUSCULAR | Status: DC | PRN
  Administered 2016-10-03: 1 mg via INTRAVENOUS

## 2016-10-03 MED ORDER — ONDANSETRON HCL 4 MG/2ML IJ SOLN
INTRAMUSCULAR | Status: AC
  Filled 2016-10-03: qty 2

## 2016-10-03 MED ORDER — PHENYLEPHRINE 40 MCG/ML (10ML) SYRINGE FOR IV PUSH (FOR BLOOD PRESSURE SUPPORT)
PREFILLED_SYRINGE | INTRAVENOUS | Status: AC
  Filled 2016-10-03: qty 10

## 2016-10-03 MED ORDER — PROPOFOL 10 MG/ML IV BOLUS
INTRAVENOUS | Status: DC | PRN
  Administered 2016-10-03: 130 mg via INTRAVENOUS

## 2016-10-03 MED ORDER — BUPIVACAINE HCL (PF) 0.25 % IJ SOLN
INTRAMUSCULAR | Status: AC
Start: 2016-10-03 — End: 2016-10-03
  Filled 2016-10-03: qty 30

## 2016-10-03 MED ORDER — LIDOCAINE 2% (20 MG/ML) 5 ML SYRINGE
INTRAMUSCULAR | Status: AC
Start: 2016-10-03 — End: 2016-10-03
  Filled 2016-10-03: qty 5

## 2016-10-03 MED ORDER — GABAPENTIN 300 MG PO CAPS
300.0000 mg | ORAL_CAPSULE | ORAL | Status: AC
  Administered 2016-10-03: 300 mg via ORAL

## 2016-10-03 MED ORDER — CELECOXIB 200 MG PO CAPS
ORAL_CAPSULE | ORAL | Status: AC
  Filled 2016-10-03: qty 2

## 2016-10-03 MED ORDER — DEXAMETHASONE SODIUM PHOSPHATE 10 MG/ML IJ SOLN
INTRAMUSCULAR | Status: AC
  Filled 2016-10-03: qty 1

## 2016-10-03 MED ORDER — SCOPOLAMINE 1 MG/3DAYS TD PT72
1.0000 | MEDICATED_PATCH | Freq: Once | TRANSDERMAL | Status: DC | PRN
Start: 2016-10-03 — End: 2016-10-03

## 2016-10-03 MED ORDER — CELECOXIB 400 MG PO CAPS
400.0000 mg | ORAL_CAPSULE | ORAL | Status: AC
  Administered 2016-10-03: 400 mg via ORAL

## 2016-10-03 MED ORDER — PROPOFOL 500 MG/50ML IV EMUL
INTRAVENOUS | Status: AC
  Filled 2016-10-03: qty 50

## 2016-10-03 MED ORDER — SODIUM CHLORIDE 0.9 % IJ SOLN
INTRAVENOUS | Status: DC | PRN
  Administered 2016-10-03: 4 mL

## 2016-10-03 MED ORDER — BUPIVACAINE-EPINEPHRINE (PF) 0.25% -1:200000 IJ SOLN
INTRAMUSCULAR | Status: DC | PRN
  Administered 2016-10-03: 17 mL

## 2016-10-03 MED ORDER — FENTANYL CITRATE (PF) 100 MCG/2ML IJ SOLN
50.0000 ug | INTRAMUSCULAR | Status: AC | PRN
  Administered 2016-10-03: 50 ug via INTRAVENOUS
  Administered 2016-10-03 (×2): 25 ug via INTRAVENOUS

## 2016-10-03 MED ORDER — GABAPENTIN 300 MG PO CAPS
ORAL_CAPSULE | ORAL | Status: AC
  Filled 2016-10-03: qty 1

## 2016-10-03 MED ORDER — EPHEDRINE 5 MG/ML INJ
INTRAVENOUS | Status: AC
  Filled 2016-10-03: qty 10

## 2016-10-03 MED ORDER — ACETAMINOPHEN 500 MG PO TABS
ORAL_TABLET | ORAL | Status: AC
  Filled 2016-10-03: qty 2

## 2016-10-03 MED ORDER — SODIUM CHLORIDE 0.9 % IJ SOLN
INTRAMUSCULAR | Status: AC
  Filled 2016-10-03: qty 10

## 2016-10-03 MED ORDER — CEFAZOLIN SODIUM-DEXTROSE 2-4 GM/100ML-% IV SOLN
INTRAVENOUS | Status: AC
  Filled 2016-10-03: qty 100

## 2016-10-03 MED ORDER — PHENYLEPHRINE HCL 10 MG/ML IJ SOLN
INTRAMUSCULAR | Status: DC | PRN
  Administered 2016-10-03 (×3): 40 ug via INTRAVENOUS
  Administered 2016-10-03: 80 ug via INTRAVENOUS

## 2016-10-03 MED ORDER — BUPIVACAINE-EPINEPHRINE (PF) 0.5% -1:200000 IJ SOLN
INTRAMUSCULAR | Status: DC | PRN
  Administered 2016-10-03: 30 mL

## 2016-10-03 MED ORDER — IBUPROFEN 800 MG PO TABS: 800.0000 mg | ORAL_TABLET | Freq: Three times a day (TID) | ORAL | 0 refills | Status: DC | PRN

## 2016-10-03 SURGICAL SUPPLY — 50 items
APPLIER CLIP 9.375 MED OPEN (MISCELLANEOUS) ×3
BINDER BREAST LRG (GAUZE/BANDAGES/DRESSINGS) IMPLANT
BINDER BREAST MEDIUM (GAUZE/BANDAGES/DRESSINGS) IMPLANT
BINDER BREAST XLRG (GAUZE/BANDAGES/DRESSINGS) ×3 IMPLANT
BINDER BREAST XXLRG (GAUZE/BANDAGES/DRESSINGS) IMPLANT
BLADE SURG 15 STRL LF DISP TIS (BLADE) ×1 IMPLANT
BLADE SURG 15 STRL SS (BLADE) ×2
CANISTER SUC SOCK COL 7IN (MISCELLANEOUS) IMPLANT
CANISTER SUCT 1200ML W/VALVE (MISCELLANEOUS) ×3 IMPLANT
CHLORAPREP W/TINT 26ML (MISCELLANEOUS) ×3 IMPLANT
CLIP APPLIE 9.375 MED OPEN (MISCELLANEOUS) ×1 IMPLANT
COVER BACK TABLE 60X90IN (DRAPES) ×3 IMPLANT
COVER MAYO STAND STRL (DRAPES) ×3 IMPLANT
COVER PROBE W GEL 5X96 (DRAPES) ×3 IMPLANT
DECANTER SPIKE VIAL GLASS SM (MISCELLANEOUS) IMPLANT
DERMABOND ADVANCED (GAUZE/BANDAGES/DRESSINGS) ×2
DERMABOND ADVANCED .7 DNX12 (GAUZE/BANDAGES/DRESSINGS) ×1 IMPLANT
DEVICE DUBIN W/COMP PLATE 8390 (MISCELLANEOUS) ×3 IMPLANT
DRAPE LAPAROSCOPIC ABDOMINAL (DRAPES) ×3 IMPLANT
DRAPE UTILITY XL STRL (DRAPES) ×3 IMPLANT
ELECT COATED BLADE 2.86 ST (ELECTRODE) ×3 IMPLANT
ELECT REM PT RETURN 9FT ADLT (ELECTROSURGICAL) ×3
ELECTRODE REM PT RTRN 9FT ADLT (ELECTROSURGICAL) ×1 IMPLANT
GLOVE BIOGEL PI IND STRL 7.0 (GLOVE) ×3 IMPLANT
GLOVE BIOGEL PI IND STRL 8 (GLOVE) ×1 IMPLANT
GLOVE BIOGEL PI INDICATOR 7.0 (GLOVE) ×6
GLOVE BIOGEL PI INDICATOR 8 (GLOVE) ×2
GLOVE ECLIPSE 8.0 STRL XLNG CF (GLOVE) ×3 IMPLANT
GLOVE SURG SS PI 6.5 STRL IVOR (GLOVE) ×6 IMPLANT
GOWN STRL REUS W/ TWL LRG LVL3 (GOWN DISPOSABLE) ×3 IMPLANT
GOWN STRL REUS W/TWL LRG LVL3 (GOWN DISPOSABLE) ×6
HEMOSTAT SNOW SURGICEL 2X4 (HEMOSTASIS) ×3 IMPLANT
ILLUMINATOR WAVEGUIDE N/F (MISCELLANEOUS) ×3 IMPLANT
KIT MARKER MARGIN INK (KITS) ×3 IMPLANT
NDL SAFETY ECLIPSE 18X1.5 (NEEDLE) ×1 IMPLANT
NEEDLE HYPO 18GX1.5 SHARP (NEEDLE) ×2
NEEDLE HYPO 25X1 1.5 SAFETY (NEEDLE) ×6 IMPLANT
NS IRRIG 1000ML POUR BTL (IV SOLUTION) ×3 IMPLANT
PACK BASIN DAY SURGERY FS (CUSTOM PROCEDURE TRAY) ×3 IMPLANT
PENCIL BUTTON HOLSTER BLD 10FT (ELECTRODE) ×3 IMPLANT
SLEEVE SCD COMPRESS KNEE MED (MISCELLANEOUS) ×3 IMPLANT
SPONGE LAP 4X18 X RAY DECT (DISPOSABLE) ×3 IMPLANT
SUT MNCRL AB 4-0 PS2 18 (SUTURE) ×3 IMPLANT
SUT VICRYL 3-0 CR8 SH (SUTURE) ×3 IMPLANT
SYR CONTROL 10ML LL (SYRINGE) ×6 IMPLANT
TOWEL OR 17X24 6PK STRL BLUE (TOWEL DISPOSABLE) ×3 IMPLANT
TOWEL OR NON WOVEN STRL DISP B (DISPOSABLE) ×3 IMPLANT
TUBE CONNECTING 20'X1/4 (TUBING) ×1
TUBE CONNECTING 20X1/4 (TUBING) ×2 IMPLANT
YANKAUER SUCT BULB TIP NO VENT (SUCTIONS) ×3 IMPLANT

## 2016-10-03 NOTE — Interval H&P Note (Signed)
History and Physical Interval Note:  10/03/2016 7:19 AM  Terry Mcneil  has presented today for surgery, with the diagnosis of left breast cancer  The various methods of treatment have been discussed with the patient and family. After consideration of risks, benefits and other options for treatment, the patient has consented to  Procedure(s): LEFT BREAST RADIOACTIVE SEED GUIDED PARTIAL MASTECTOMY WITH LEFT SENTINEL [place] (Left) as a surgical intervention .  The patient's history has been reviewed, patient examined, no change in status, stable for surgery.  I have reviewed the patient's chart and labs.  Questions were answered to the patient's satisfaction.     [name]

## 2016-10-03 NOTE — Anesthesia Postprocedure Evaluation (Signed)
Anesthesia Post Note  Patient: Terry Mcneil  Procedure(s) Performed: Procedure(s) (LRB): LEFT BREAST RADIOACTIVE SEED GUIDED PARTIAL MASTECTOMY WITH LEFT SENTINEL LYMPH NODE MAPPING (Left)  Patient location during evaluation: PACU Anesthesia Type: General Level of consciousness: awake and alert Pain management: pain level controlled Vital Signs Assessment: post-procedure vital signs reviewed and stable Respiratory status: spontaneous breathing, nonlabored ventilation, respiratory function stable and patient connected to nasal cannula oxygen Cardiovascular status: blood pressure returned to baseline and stable Postop Assessment: no signs of nausea or vomiting Anesthetic complications: no       Last Vitals:  Vitals:   10/03/16 0945 10/03/16 1012  BP: 120/78 115/71  Pulse: 74 76  Resp: 14 16  Temp:  36.3 C    Last Pain:  Vitals:   10/03/16 1012  TempSrc: Oral  PainSc:                  Tiajuana Amass

## 2016-10-03 NOTE — H&P (View-Only) (Signed)
Terry Mcneil 09/10/2016 10:16 AM Location: [facility] Patient #: [id number] DOB: 04/27/1941 Married / Language: English / Race: White Female  History of Present Illness Terry Moores A. [name] MD; 09/10/2016 1:51 PM) Patient words: Patient sent at the request of Dr. Ammie Ferrier for an abnormal mammogram. The patient went for screening mammogram and a 4 mm spiculated mass was noted left breast in the upper outer quadrant. Core biopsy showed invasive mammary carcinoma favoring a double phenotype. Receptors are pending. Patient denies any history of breast pain, nipple discharge or mass.            CLINICAL DATA: 76 year old female presenting for screening recall of a possible left breast mass.  EXAM: 2D DIGITAL DIAGNOSTIC UNILATERAL LEFT MAMMOGRAM WITH CAD AND ADJUNCT TOMO  LEFT BREAST ULTRASOUND  COMPARISON: Previous exam(s).  ACR Breast Density Category b: There are scattered areas of fibroglandular density.  FINDINGS: In the upper-outer quadrant of the left breast, posterior depth, there is a persistent asymmetry, which on the MLO view appears to be a 4 mm possibly spiculated mass. Ultrasound will be performed for further evaluation.  Mammographic images were processed with CAD.  No definite palpable masses are identified in the upper-outer quadrant of the left breast.  Ultrasound targeted to the left breast at 1:30, 4 cm from the nipple demonstrates a small hypoechoic irregular shadowing mass measuring 4 x 4 x 4 mm, corresponding in size and position with the mammographic finding of concern. This is located near the benign-appearing lymph node seen on ultrasound a year ago. Ultrasound of the left axilla demonstrates multiple normal-appearing lymph nodes. There is 1 prominent lymph node with a cortex measuring just under 4 mm, however there is no suspicious focal bulge and the fatty hilum is preserved.  IMPRESSION: 1. There is a suspicious mass  in the left breast at 1:30.  2. There is a slightly prominent left axillary lymph node which appears otherwise morphologically normal.  RECOMMENDATION: Ultrasound-guided biopsy is recommended for the 4 mm mass in the left breast at 1:30.  I have discussed the findings and recommendations with the patient. Results were also provided in writing at the conclusion of the visit. If applicable, a reminder letter will be sent to the patient regarding the next appointment.  BI-RADS CATEGORY 4: Suspicious.   Electronically Signed By: Ammie Ferrier M.D. On: 09/04/2016 12:40               ADDITIONAL INFORMATION: The malignant cells are positive for E-cadherin, supporting a ductal phenotype. (JBK:ah 09/09/16) Enid Cutter MD Pathologist, Electronic Signature ( Signed 09/09/2016) FINAL DIAGNOSIS Diagnosis Breast, left, needle core biopsy, 1:30 o'clock - INVASIVE MAMMARY CARCINOMA. - SEE COMMENT. Microscopic Comment The carcinoma appears grade 1. An E-Cadherin stain and a breast prognostic profile will be performed and the results reported separately. The results were called to [facility] on 09/09/16. (JBK:gt, 09/09/16) Enid Cutter MD Pathologist, Electronic Signature (Case signed 09/09/2016) Specimen Gross and Clinical Information Specimen Comment In formalin: 3:05 PM; extracted < 5 min; screening detected left breast mass; right breast benign hyalinized fibroadenoma Specimen(s) Obtained: Breast, left, needle core biopsy, 1:30 o'clock 1 of 2 FINAL for Mcneil, Terry S ([id number]) Specimen Clinical Information Suspicious for malignancy Gross Received in formalin labeled with the patient's name and "Left breast 1:30", are 3 core(s) of tan-         CLINICAL DATA: 76 year old female presenting for screening recall of a possible left breast mass.  EXAM: 2D DIGITAL  DIAGNOSTIC UNILATERAL LEFT MAMMOGRAM WITH CAD AND ADJUNCT TOMO  LEFT  BREAST ULTRASOUND  COMPARISON: Previous exam(s).  ACR Breast Density Category b: There are scattered areas of fibroglandular density.  FINDINGS: In the upper-outer quadrant of the left breast, posterior depth, there is a persistent asymmetry, which on the MLO view appears to be a 4 mm possibly spiculated mass. Ultrasound will be performed for further evaluation.  Mammographic images were processed with CAD.  No definite palpable masses are identified in the upper-outer quadrant of the left breast.  Ultrasound targeted to the left breast at 1:30, 4 cm from the nipple demonstrates a small hypoechoic irregular shadowing mass measuring 4 x 4 x 4 mm, corresponding in size and position with the mammographic finding of concern. This is located near the benign-appearing lymph node seen on ultrasound a year ago. Ultrasound of the left axilla demonstrates multiple normal-appearing lymph nodes. There is 1 prominent lymph node with a cortex measuring just under 4 mm, however there is no suspicious focal bulge and the fatty hilum is preserved.  IMPRESSION: 1. There is a suspicious mass in the left breast at 1:30.  2. There is a slightly prominent left axillary lymph node which appears otherwise morphologically normal.  RECOMMENDATION: Ultrasound-guided biopsy is recommended for the 4 mm mass in the left breast at 1:30.  I have discussed the findings and recommendations with the patient. Results were also provided in writing at the conclusion of the visit. If applicable, a reminder letter will be sent to the patient regarding the next appointment.  BI-RADS CATEGORY 4: Suspicious.   Electronically Signed By: Ammie Ferrier M.D. On: 09/04/2016 12:40.  The patient is a 76 year old female.   Past Surgical History (Janette Ranson, CMA; 09/10/2016 10:17 AM) Breast Biopsy Bilateral. Cataract Surgery Bilateral. Knee Surgery Right.  Diagnostic Studies History (Janette  Ranson, CMA; 09/10/2016 10:17 AM) Colonoscopy 1-5 years ago Mammogram within last year Pap Smear >5 years ago  Allergies (Janette Ranson, CMA; 09/10/2016 10:19 AM) No Known Drug Allergies 09/10/2016 Allergies Reconciled  Medication History (Janette Ranson, CMA; 09/10/2016 10:19 AM) Atorvastatin Calcium (20MG  Tablet, Oral) Active. Carvedilol (6.25MG  Tablet, Oral) Active. Lisinopril-Hydrochlorothiazide (20-25MG  Tablet, Oral) Active. Tolterodine Tartrate ER (4MG  Capsule ER 24HR, Oral) Active. Calcium "900" w/D (Oral) Active. Aspirin (81MG  Tablet DR, Oral) Active. Multivitamin Adults 50+ (Oral) Active. Medications Reconciled  Social History (Janette Ranson, CMA; 09/10/2016 10:17 AM) Alcohol use Moderate alcohol use. No caffeine use No drug use Tobacco use Former smoker.  Family History (Janette Ranson, CMA; 09/10/2016 10:17 AM) Heart Disease Father. Hypertension Mother. Migraine Headache Mother.  Pregnancy / Birth History (Janette Ranson, CMA; 09/10/2016 10:17 AM) Age at menarche 1 years. Age of menopause [age] Gravida 2 Length (months) of breastfeeding 3-6 Maternal age 67-25 Para 2  Other Problems (Janette Ranson, CMA; 09/10/2016 10:17 AM) Arthritis Back Pain Breast Cancer High blood pressure     Review of Systems (Janette Ranson CMA; 09/10/2016 10:17 AM) General Not Present- Appetite Loss, Chills, Fatigue, Fever, Night Sweats, Weight Gain and Weight Loss. Skin Not Present- Change in Wart/Mole, Dryness, Hives, Jaundice, New Lesions, Non-Healing Wounds, Rash and Ulcer. HEENT Not Present- Earache, Hearing Loss, Hoarseness, Nose Bleed, Oral Ulcers, Ringing in the Ears, Seasonal Allergies, Sinus Pain, Sore Throat, Visual Disturbances, Wears glasses/contact lenses and Yellow Eyes. Respiratory Not Present- Bloody sputum, Chronic Cough, Difficulty Breathing, Snoring and Wheezing. Breast Present- Breast Mass. Not Present- Breast Pain, Nipple Discharge and Skin  Changes. Musculoskeletal Present- Back Pain. Not Present- Joint Pain, Joint  Stiffness, Muscle Pain, Muscle Weakness and Swelling of Extremities. Neurological Not Present- Decreased Memory, Fainting, Headaches, Numbness, Seizures, Tingling, Tremor, Trouble walking and Weakness. Psychiatric Not Present- Anxiety, Bipolar, Change in Sleep Pattern, Depression, Fearful and Frequent crying. Hematology Not Present- Blood Thinners, Easy Bruising, Excessive bleeding, Gland problems, HIV and Persistent Infections.  Vitals (Janette Ranson CMA; 09/10/2016 10:19 AM) 09/10/2016 10:19 AM Weight: 157 lb Height: 65in Body Surface Area: 1.78 m Body Mass Index: 26.13 kg/m  Temp.: 2F  Pulse: 60 (Regular)  BP: 128/80 (Sitting, Left Arm, Standard)      Physical Exam (Telina Kleckley A. Rossi Burdo MD; 09/10/2016 1:51 PM)  General Mental Status-Alert. General Appearance-Consistent with stated age. Hydration-Well hydrated. Voice-Normal.  Head and Neck Head-normocephalic, atraumatic with no lesions or palpable masses. Trachea-midline. Thyroid Gland Characteristics - normal size and consistency.  Eye Eyeball - Bilateral-Extraocular movements intact. Sclera/Conjunctiva - Bilateral-No scleral icterus.  Chest and Lung Exam Chest and lung exam reveals -quiet, even and easy respiratory effort with no use of accessory muscles and on auscultation, normal breath sounds, no adventitious sounds and normal vocal resonance. Inspection Chest Wall - Normal. Back - normal.  Breast Breast - Left-Symmetric, Non Tender, No Biopsy scars, no Dimpling, No Inflammation, No Lumpectomy scars, No Mastectomy scars, No Peau d' Orange. Breast - Right-Symmetric, Non Tender, No Biopsy scars, no Dimpling, No Inflammation, No Lumpectomy scars, No Mastectomy scars, No Peau d' Orange. Breast Lump-No Palpable Breast Mass.  Cardiovascular Cardiovascular examination reveals -normal heart sounds, regular rate  and rhythm with no murmurs and normal pedal pulses bilaterally.  Abdomen Inspection Inspection of the abdomen reveals - No Hernias. Skin - Scar - no surgical scars. Palpation/Percussion Palpation and Percussion of the abdomen reveal - Soft, Non Tender, No Rebound tenderness, No Rigidity (guarding) and No hepatosplenomegaly. Auscultation Auscultation of the abdomen reveals - Bowel sounds normal.  Neurologic Neurologic evaluation reveals -alert and oriented x 3 with no impairment of recent or remote memory. Mental Status-Normal.  Musculoskeletal Normal Exam - Left-Upper Extremity Strength Normal and Lower Extremity Strength Normal. Normal Exam - Right-Upper Extremity Strength Normal and Lower Extremity Strength Normal.  Lymphatic Head & Neck  General Head & Neck Lymphatics: Bilateral - Description - Normal. Axillary  General Axillary Region: Bilateral - Description - Normal. Tenderness - Non Tender. Femoral & Inguinal  Generalized Femoral & Inguinal Lymphatics: Bilateral - Description - Normal. Tenderness - Non Tender.    Assessment & Plan (Marda Breidenbach A. Hilda Wexler MD; 09/10/2016 1:55 PM)  BREAST CANCER, LEFT (C50.912) Impression: Risk of lumpectomy include bleeding, infection, seroma, more surgery, use of seed/wire, wound care, cosmetic deformity and the need for other treatments, death , blood clots, death. Pt agrees to proceed. Risk of sentinel lymph node mapping include bleeding, infection, lymphedema, shoulder pain. stiffness, dye allergy. cosmetic deformity , blood clots, death, need for more surgery. Pt agres to proceed. Stage I left breast cancer.  Current Plans You are being scheduled for surgery- Our schedulers will call you.  You should hear from our office's scheduling department within 5 working days about the location, date, and time of surgery. We try to make accommodations for patient's preferences in scheduling surgery, but sometimes the OR schedule or the  surgeon's schedule prevents [place] from making those accommodations.  If you have not heard from our office [phone]) in 5 working days, call the office and ask for your surgeon's nurse.  If you have other questions about your diagnosis, plan, or surgery, call the office and ask for your surgeon's nurse.  Pt Education - [facility] Breast Cancer Information Given - [facility] "Breast Journey" Package Pt Education - Pamphlet Given - Breast Biopsy: discussed with patient and provided information. We discussed the staging and pathophysiology of breast cancer. We discussed all of the different options for treatment for breast cancer including surgery, chemotherapy, radiation therapy, Herceptin, and antiestrogen therapy. We discussed a sentinel lymph node biopsy as she does not appear to having lymph node involvement right now. We discussed the performance of that with injection of radioactive tracer and blue dye. We discussed that she would have an incision underneath her axillary hairline. We discussed that there is a bout a 10-20% chance of having a positive node with a sentinel lymph node biopsy and we will await the permanent pathology to make any other first further decisions in terms of her treatment. One of these options might be to return to the operating room to perform an axillary lymph node dissection. We discussed about a 1-2% risk lifetime of chronic shoulder pain as well as lymphedema associated with a sentinel lymph node biopsy. We discussed the options for treatment of the breast cancer which included lumpectomy versus a mastectomy. We discussed the performance of the lumpectomy with a wire placement. We discussed a 10-20% chance of a positive margin requiring reexcision in the operating room. We also discussed that she may need radiation therapy or antiestrogen therapy or both if she undergoes lumpectomy. We discussed the mastectomy and the postoperative care for that as well. We discussed that there is  no difference in her survival whether she undergoes lumpectomy with radiation therapy or antiestrogen therapy versus a mastectomy. There is a slight difference in the local recurrence rate being 3-5% with lumpectomy and about 1% with a mastectomy. We discussed the risks of operation including bleeding, infection, possible reoperation. She understands her further therapy will be based on what her stages at the time of her operation.  Pt Education - [facility] breast cancer surgery: discussed with patient and provided information. Pt Education - [facility] Breast Biopsy HCI: discussed with patient and provided information. Pt Education - ABC (After Breast Cancer) Class Info: discussed with patient and provided information. Referred to Radiation Oncology, for evaluation and follow up (Radiation Oncology). Routine. Referred to Oncology, for evaluation and follow up (Oncology). Routine.

## 2016-10-03 NOTE — Transfer of Care (Signed)
Immediate Anesthesia Transfer of Care Note  Patient: Terry Mcneil  Procedure(s) Performed: Procedure(s): LEFT BREAST RADIOACTIVE SEED GUIDED PARTIAL MASTECTOMY WITH LEFT SENTINEL LYMPH NODE MAPPING (Left)  Patient Location: PACU  Anesthesia Type:General  Level of Consciousness: alert , oriented, drowsy and patient cooperative  Airway & Oxygen Therapy: Patient Spontanous Breathing and Patient connected to face mask oxygen  Post-op Assessment: Report given to RN and Post -op Vital signs reviewed and stable  Post vital signs: Reviewed and stable  Last Vitals:  Vitals:   10/03/16 0739 10/03/16 0924  BP:  130/80  Pulse: 65 72  Resp: 13   Temp:      Last Pain:  Vitals:   10/03/16 0638  TempSrc: Oral      Patients Stated Pain Goal: 0 (76/[id number]/41 [id number])  Complications: No apparent anesthesia complications

## 2016-10-03 NOTE — Anesthesia Procedure Notes (Signed)
Anesthesia Regional Block: Pectoralis block   Pre-Anesthetic Checklist: ,, timeout performed, Correct Patient, Correct Site, Correct Laterality, Correct Procedure, Correct Position, site marked, Risks and benefits discussed,  Surgical consent,  Pre-op evaluation,  At surgeon's request and post-op pain management  Laterality: Left  Prep: chloraprep       Needles:  Injection technique: Single-shot  Needle Type: Echogenic Needle     Needle Length: 9cm  Needle Gauge: 21     Additional Needles:   Procedures: ultrasound guided,,,,,,,,  Narrative:  Start time: 10/03/2016 7:28 AM End time: 10/03/2016 7:34 AM Injection made incrementally with aspirations every 5 mL.  Performed by: Personally  Anesthesiologist: Suzette Battiest

## 2016-10-03 NOTE — Op Note (Signed)
Preoperative diagnosis: Stage I left breast cancer  upper-outer quadrant   Postoperative diagnosis: Same   Procedure: Left breast seed localized lumpectomy with deep  left axillary sentinel lymph node mapping with methylene blue dye   Surgeon: Erroll Luna M.D.   Anesthesia: LMA with pectoral block anesthesia and local   EBL: 20 cc   Specimen: Left breast mass with clipped and seed to pathology and one axillary sentinel node hot and blue   Drains: None   Indications for procedure: Patient presents for treatment of her left breast cancer. She has opted for breast conservation after lengthy discussion of treatment options to include breast conservation surgery and mastectomy and reconstruction. Risks, benefits and alternatives discussed with the patient.The procedure has been discussed with the patient. Alternatives to surgery have been discussed with the patient. Risks of surgery include bleeding, Infection, Seroma formation, death, and the need for further surgery. The patient understands and wishes to proceed.Sentinel lymph node mapping and dissection has been discussed with the patient. Risk of bleeding, Infection, Seroma formation, Additional procedures,, Shoulder weakness , Shoulder stiffness, Nerve and blood vessel injury and reaction to the mapping dyes have been discussed. Alternatives to surgery have been discussed with the patient. The patient agrees to proceed.     Description of procedure: Patient underwent placement of left breast need a radiology earlier in the week. She presents to the holding area and questions are answered. Neoprobe was used to verify clip placement in the left breast. Patient underwent technetium sulfur colloid injection per protocol. Questions answered. Patient taken back to operating room and placed supine on the operating room table. Patient received 2 g of Ancef. After induction of LMA anesthesia left breast was prepped and draped in a sterile fashion and  4 cc of methylene blue dye were injected in a subareolar position. Of note, patient had pectoral block by anesthesia prior to this. Neoprobe was used to identify the radioactive seen in the left upper-outer quadrant. Curvilinear incision made around the superior NAC  and dissection was carried around to excise all tissue around both the clip and seed. Radiograph showed the mass with gross negative margins. Both seed  and clip were in the specimen. Specimen sent to pathology. Hemostasis achieved and clips placed.   Neoprobe was switched to the technetium sulfur colloid setting. Hot spot identify the left axilla. Incision made in the inferior axillary hairline and dissection carried into the axilla. Hot and blue lymph node identified and excised. Background counts approached 0. Balloon was irrigated and closed with 3-0 Vicryl and 4-0 Monocryl. Lumpectomy site closed in a similar fashion. Dermabond applied. All final counts sponge, needle instruments found to be correct at this point. Patient awoke, taken to recovery in satisfactory condition.

## 2016-10-03 NOTE — Anesthesia Procedure Notes (Signed)
Procedure Name: LMA Insertion Date/Time: 10/03/2016 8:00 AM Performed by: Genelle Bal Pre-anesthesia Checklist: Patient identified, Emergency Drugs available, Suction available, Patient being monitored and Timeout performed Patient Re-evaluated:Patient Re-evaluated prior to inductionOxygen Delivery Method: Circle system utilized Preoxygenation: Pre-oxygenation with 100% oxygen Intubation Type: IV induction Ventilation: Mask ventilation without difficulty LMA: LMA inserted LMA Size: 4.0 Number of attempts: 1 Placement Confirmation: positive ETCO2,  CO2 detector and breath sounds checked- equal and bilateral Tube secured with: Tape Dental Injury: Teeth and Oropharynx as per pre-operative assessment

## 2016-10-03 NOTE — Discharge Instructions (Signed)
[facility] °Office Phone Number [phone] ° °BREAST BIOPSY/ PARTIAL MASTECTOMY: POST OP INSTRUCTIONS ° °Always review your discharge instruction sheet given to you by the facility where your surgery was performed. ° °IF YOU HAVE DISABILITY OR FAMILY LEAVE FORMS, YOU MUST BRING THEM TO THE OFFICE FOR PROCESSING.  DO NOT GIVE THEM TO YOUR DOCTOR. ° °1. A prescription for pain medication may be given to you upon discharge.  Take your pain medication as prescribed, if needed.  If narcotic pain medicine is not needed, then you may take acetaminophen (Tylenol) or ibuprofen (Advil) as needed. °2. Take your usually prescribed medications unless otherwise directed °3. If you need a refill on your pain medication, please contact your pharmacy.  They will contact our office to request authorization.  Prescriptions will not be filled after 5pm or on week-ends. °4. You should eat very light the first 24 hours after surgery, such as soup, crackers, pudding, etc.  Resume your normal diet the day after surgery. °5. Most patients will experience some swelling and bruising in the breast.  Ice packs and a good support bra will help.  Swelling and bruising can take several days to resolve.  °6. It is common to experience some constipation if taking pain medication after surgery.  Increasing fluid intake and taking a stool softener will usually help or prevent this problem from occurring.  A mild laxative (Milk of Magnesia or Miralax) should be taken according to package directions if there are no bowel movements after 48 hours. °7. Unless discharge instructions indicate otherwise, you may remove your bandages 24-48 hours after surgery, and you may shower at that time.  You may have steri-strips (small skin tapes) in place directly over the incision.  These strips should be left on the skin for 7-10 days.  If your surgeon used skin glue on the incision, you may shower in 24 hours.  The glue will flake off over the  next 2-3 weeks.  Any sutures or staples will be removed at the office during your follow-up visit. °8. ACTIVITIES:  You may resume regular daily activities (gradually increasing) beginning the next day.  Wearing a good support bra or sports bra minimizes pain and swelling.  You may have sexual intercourse when it is comfortable. °a. You may drive when you no longer are taking prescription pain medication, you can comfortably wear a seatbelt, and you can safely maneuver your car and apply brakes. °b. RETURN TO WORK:  ______________________________________________________________________________________ °9. You should see your doctor in the office for a follow-up appointment approximately two weeks after your surgery.  Your doctor’s nurse will typically make your follow-up appointment when she calls you with your pathology report.  Expect your pathology report 2-3 business days after your surgery.  You may call to check if you do not hear from us after three days. °10. OTHER INSTRUCTIONS: _______________________________________________________________________________________________ _____________________________________________________________________________________________________________________________________ °_____________________________________________________________________________________________________________________________________ °_____________________________________________________________________________________________________________________________________ ° °WHEN TO CALL YOUR DOCTOR: °1. Fever over 101.0 °2. Nausea and/or vomiting. °3. Extreme swelling or bruising. °4. Continued bleeding from incision. °5. Increased pain, redness, or drainage from the incision. ° °The clinic staff is available to answer your questions during regular business hours.  Please don’t hesitate to call and ask to speak to one of the nurses for clinical concerns.  If you have a medical emergency, go to the nearest  emergency room or call 911.  A surgeon from [facility] is always on call at the hospital. ° °For further questions, please visit [email or web address]  ° ° ° ° °  Post Anesthesia Home Care Instructions ° °Activity: °Get plenty of rest for the remainder of the day. A responsible individual must stay with you for 24 hours following the procedure.  °For the next 24 hours, DO NOT: °-Drive a car °-Operate machinery °-Drink alcoholic beverages °-Take any medication unless instructed by your physician °-Make any legal decisions or sign important papers. ° °Meals: °Start with liquid foods such as gelatin or soup. Progress to regular foods as tolerated. Avoid greasy, spicy, heavy foods. If nausea and/or vomiting occur, drink only clear liquids until the nausea and/or vomiting subsides. Call your physician if vomiting continues. ° °Special Instructions/Symptoms: °Your throat may feel dry or sore from the anesthesia or the breathing tube placed in your throat during surgery. If this causes discomfort, gargle with warm salt water. The discomfort should disappear within 24 hours. ° °If you had a scopolamine patch placed behind your ear for the management of post- operative nausea and/or vomiting: ° °1. The medication in the patch is effective for 72 hours, after which it should be removed.  Wrap patch in a tissue and discard in the trash. Wash hands thoroughly with soap and water. °2. You may remove the patch earlier than 72 hours if you experience unpleasant side effects which may include dry mouth, dizziness or visual disturbances. °3. Avoid touching the patch. Wash your hands with soap and water after contact with the patch. °  ° °

## 2016-10-03 NOTE — Progress Notes (Signed)
Assisted Dr. Rob Fitzgerald with left, ultrasound guided, pectoralis block. Side rails up, monitors on throughout procedure. See vital signs in flow sheet. Tolerated Procedure well. 

## 2016-10-03 NOTE — Progress Notes (Signed)
Emotional support during breast injections °

## 2016-10-04 ENCOUNTER — Encounter (HOSPITAL_BASED_OUTPATIENT_CLINIC_OR_DEPARTMENT_OTHER): Payer: Self-pay | Admitting: Surgery

## 2016-10-18 ENCOUNTER — Encounter: Payer: Self-pay | Admitting: *Deleted

## 2016-10-18 NOTE — Progress Notes (Signed)
[facility] Psychosocial Distress Screening Clinical Social Work  Clinical Social Work was referred by distress screening protocol.  The patient scored a 5 on the Psychosocial Distress Thermometer which indicates moderate distress. Clinical Social Worker reviewed chart and phoned pt to assess for distress and other psychosocial needs. CSW introduced self, explained role of CSW/Pt and Family Support Team, support groups and other resources to assist. Pt stated she was doing "fine" and was not really interested in resources or support at this current time. CSW encouraged pt to reach out as needed.    ONCBCN DISTRESS SCREENING 09/25/2016  Screening Type Initial Screening  Distress experienced in past week (1-10) 5  Emotional problem type Adjusting to illness  Information Concerns Type Lack of info about diagnosis;Lack of info about treatment  Physician notified of physical symptoms Yes  Referral to clinical social work No    Clinical Social Worker follow up needed: No.  If yes, follow up plan:  Loren Racer, Marlinda Mike, OSW-C Clinical Social Worker [place]  [facility] Phone: [phone] Fax: [phone]

## 2016-10-30 ENCOUNTER — Encounter (HOSPITAL_COMMUNITY): Payer: Self-pay

## 2016-11-04 NOTE — Progress Notes (Signed)
Follow Up New Consult Left Breast Upper Outer Quadrant,   Dr. Erroll Luna, MD  Diagnosis 10/03/2016: 1. Breast, lumpectomy, Left - INVASIVE DUCTAL CARCINOMA, NOTTINGHAM GRADE 1 OF 3, 0.7 CM - MARGINS UNINVOLVED BY CARCINOMA - NO LYMPHOVASCULAR SPACE INVASION IDENTIFIED - PREVIOUS BIOPSY SITE CHANGES - SEE ONCOLOGY TABLE AND COMMENT 2. Breast, excision, Left additional anterior margin - FIBROCYSTIC CHANGE - USUAL DUCTAL HYPERPLASIA - NO CARCINOMA IDENTIFIED 3. Breast, excision, Left additional superior margin - NO CARCINOMA IDENTIFIED 4. Lymph node, sentinel, biopsy, Left axillary - NO CARCINOMA IDENTIFIED IN ONE LYMPH NODE (0/1) 5. Lymph node, sentinel, biopsy, Left axillary - NO CARCINOMA IDENTIFIED IN ONE LYMPH NODE (0/1)  Sees Dr. Humphrey Rolls  MD tomorrow at [place]  Incision under left axilla and around nipple well healed, no c/o pain  , Unsure if needs radiation 10:34 AM BP (!) 150/83 Comment: right arm  Pulse 88   Temp 97.9 F (36.6 C) (Oral)   Resp 20   Ht 5' 5.5" (1.664 m)   Wt 157 lb 3.2 oz (71.3 kg)   BMI 25.76 kg/m   Wt Readings from Last 3 Encounters:  11/05/16 157 lb 3.2 oz (71.3 kg)  10/03/16 158 lb 12.8 oz (72 kg)  09/25/16 159 lb 3.2 oz (72.2 kg)

## 2016-11-05 ENCOUNTER — Encounter: Payer: Self-pay | Admitting: Radiation Oncology

## 2016-11-05 ENCOUNTER — Ambulatory Visit
Admission: RE | Admit: 2016-11-05 | Discharge: 2016-11-05 | Disposition: A | Discharge status: Home / Self Care | Payer: Medicare Other | Source: Ambulatory Visit | Attending: Radiation Oncology | Admitting: Radiation Oncology

## 2016-11-05 VITALS — BP 150/83 | HR 88 | Temp 97.9°F | Resp 20 | Ht 65.5 in | Wt 157.2 lb

## 2016-11-05 DIAGNOSIS — Z170 Estrogen receptor positive status [ER+]: Secondary | ICD-10-CM

## 2016-11-05 DIAGNOSIS — C50412 Malignant neoplasm of upper-outer quadrant of left female breast: Secondary | ICD-10-CM | POA: Diagnosis not present

## 2016-11-05 NOTE — Progress Notes (Signed)
Radiation Oncology         [phone] ________________________________  Name: Terry Mcneil MRN: [id number]  Date: 11/05/2016  DOB: February 20, 1941   Attestation Please see the note from Shona Simpson, PA-C from today's visit for more details of today's encounter.  I have personally performed a face to face diagnostic evaluation on this patient and devised the following assessment and plan.  The patient returns to clinic after undergoing a lumpectomy. This appears to represent a very favorable tumor based on clinical/pathologic features. Surprisingly, the patient's Oncotype test returned a score of 38. She does see medical oncology tomorrow. We discussed proceeding with a hypo-fractionated course of radiation treatment to the left breast at the appropriate time. All of her questions were answered. We will touch base with the patient later this week to see what her plans are with medical oncology.   Terry Rudd, MD    Follow-Up Note  CC: [name], NP  Erroll Luna, MD    Diagnosis:   76 year-old woman with pT1b Grade I, ER/PR positive, invasive ductal carcinoma of the left breast.  Narrative:  The patient returns today for follow-up.  The patient was originally seen for consultation on 09/25/2016. In the interim, the patient underwent left breast lumpectomy with left sentinel lymph node mapping with Dr. Ola Spurr on 10/03/2016. Final pathology showed invasive ductal carcinoma, nottingham grade 1, 0.7 cm, ER 100% / PR 5 % / HER-2 negative / Ki-67 5%. The margins were uninvolved by carcinoma and there was no lymphovascular invasion identified. The left breast additional anterior margin showed fibrocystic change, usual ductal hyperplasia, and no carcinoma identified. 0 out of 2 axillary lymph nodes showed malignancy.  Oncotype Dx score is 38, indicating a 26% risk or disease recurrence within 10 years.  [name] scheduled to see her medical oncologist, Dr. Humphrey Rolls at [facility] tomorrow. The patient is interested in anti-estrogen therapy alone, if it is recommended.   On review of systems, the patient states she is doing well overall. She denies any pain, though she does report a little tenderness at the incision site. She denies bleeding or discharge from the incision site. She has been doing well since surgery and states that she is healing well without complications. She has no other concerns or complaints at this time. She will be out of town the week of July 4th.  ALLERGIES:  has No Known Allergies.  Meds: Current Outpatient Prescriptions  Medication Sig Dispense Refill  . aspirin EC 81 MG tablet Take 81 mg by mouth daily.    Marland Kitchen atorvastatin (LIPITOR) 20 MG tablet Take 0.5 tablets (10 mg total) by mouth daily. 45 tablet 1  . calcium-vitamin D (OSCAL WITH D) 500-200 MG-UNIT per tablet Take 1 tablet by mouth 2 (two) times daily.    . carvedilol (COREG) 6.25 MG tablet Take 1/2 tablet twice a day. 90 tablet 1  . lisinopril-hydrochlorothiazide (PRINZIDE,ZESTORETIC) 20-25 MG tablet Take 1 tablet by mouth daily. 90 tablet 1  . Multiple Vitamin (MULTIVITAMIN WITH MINERALS) TABS Take 1 tablet by mouth daily.    Marland Kitchen tolterodine (DETROL LA) 4 MG 24 hr capsule TAKE 1 CAPSULE (4 MG TOTAL) BY MOUTH DAILY. 90 capsule 1   No current facility-administered medications for this encounter.     Physical Findings:  height is 5' 5.5" (1.664 m) and weight is 157 lb 3.2 oz (71.3 kg). Her oral temperature is 97.9 F (36.6 C). Her blood pressure is 150/83 (abnormal) and  her pulse is 88. Her respiration is 20.  Pain Assessment Pain Score: 0-No pain/10 In general this is a well appearing caucasian female in no acute distress. She's alert and oriented x4 and appropriate throughout the examination. Cardiopulmonary assessment is negative for acute distress and she exhibits normal effort. Breast exam shows well healing incisions , left breast and axilla without signs of infection  or inflammation, non-tender to palpation, and no palpable masses or nodules.   Lab Findings: Lab Results  Component Value Date   WBC 7.0 09/30/2016   HGB 12.8 09/30/2016   HCT 38.5 09/30/2016   MCV 94.4 09/30/2016   PLT 236 09/30/2016     Radiographic Findings: No results found.  Impression/Plan: 61. 76 year-old woman with pT1b Grade I, ER/PR positive, invasive ductal carcinoma of the left breast. Today, we talked to the patient and her husband about the findings and workup thus far. We reviewed her pathology as well as the natural history of invasive carcinoma and general treatment, highlighting the role of radiotherapy in the management. We discussed the available radiation techniques, and focused on the details of logistics and delivery. We reviewed the anticipated acute and late sequelae associated with radiation in this setting. The recommendation is for a course of 4 weeks of radiotherapy.  The patient was encouraged to ask questions that were answered to her satisfaction. [name] scheduled for follow up with Dr. Humphrey Rolls at [facility] on 11/06/16. The patient is interested in proceeding with radiotherapy. We will call the patient this Thursday to discuss how she would like to proceed with treatment based on recommendations from Dr. Humphrey Rolls regarding systemic therapy.    Terry Johns, PA-C  This document serves as a record of services personally performed by Terry Rudd, MD and Freeman Caldron, PA-C. It was created on their behalf by Arlyce Harman, a trained medical scribe. The creation of this record is based on the scribe's personal observations and the provider's statements to them. This document has been checked and approved by the attending provider.

## 2016-11-05 NOTE — Progress Notes (Signed)
Please see the Nurse Progress Note in the MD Initial Consult Encounter for this patient. 

## 2016-11-22 ENCOUNTER — Telehealth: Payer: Self-pay | Admitting: Urology

## 2016-11-22 NOTE — Telephone Encounter (Signed)
I called and spoke with the patient in follow up from her recent office visit on 11/05/16.  After her visit with Dr. Humphrey Rolls on 11/06/16, she decided to start chemotherapy with plans for a 6-8 week course of tx.  [name] interested in adjuvant radiation once she completes chemotherapy. She will contact our office once she completes systemic tx and we will move forward with scheduling CT SIM at that time. She was very appreciative of the call.

## 2016-11-27 ENCOUNTER — Telehealth: Payer: Self-pay | Admitting: Radiation Oncology

## 2016-11-27 NOTE — Telephone Encounter (Signed)
I called the patient. She started chemon on 11/22/16 and is scheduled q3 weeks. [name] planning 4-6 treatments. I will follow up with her mid October following her 4th cycle provided her schedule stays on time.

## 2017-02-20 ENCOUNTER — Other Ambulatory Visit: Payer: Self-pay | Admitting: Family

## 2017-02-21 ENCOUNTER — Ambulatory Visit: Payer: Medicare Other | Admitting: Family

## 2017-03-05 ENCOUNTER — Telehealth: Payer: Self-pay | Admitting: Radiation Oncology

## 2017-03-05 NOTE — Telephone Encounter (Signed)
I spoke with the patient and she is planning another cycle of chemotherapy Friday this week. She's decided to proceed with radiation closer to home in [facility]. I encouraged her to call us if she had any other needs moving forward.

## 2017-03-18 ENCOUNTER — Encounter: Payer: Self-pay | Admitting: Family

## 2017-03-18 ENCOUNTER — Ambulatory Visit: Payer: Medicare Other | Admitting: Family

## 2017-03-18 VITALS — BP 117/67 | HR 90 | Temp 98.0°F | Resp 18 | Ht 65.0 in | Wt 154.0 lb

## 2017-03-18 DIAGNOSIS — C50919 Malignant neoplasm of unspecified site of unspecified female breast: Secondary | ICD-10-CM | POA: Diagnosis not present

## 2017-03-18 DIAGNOSIS — Z23 Encounter for immunization: Secondary | ICD-10-CM

## 2017-03-18 DIAGNOSIS — I10 Essential (primary) hypertension: Secondary | ICD-10-CM | POA: Diagnosis not present

## 2017-03-18 DIAGNOSIS — N3281 Overactive bladder: Secondary | ICD-10-CM

## 2017-03-18 DIAGNOSIS — E785 Hyperlipidemia, unspecified: Secondary | ICD-10-CM | POA: Diagnosis not present

## 2017-03-18 DIAGNOSIS — Z853 Personal history of malignant neoplasm of breast: Secondary | ICD-10-CM | POA: Insufficient documentation

## 2017-03-18 LAB — BASIC METABOLIC PANEL
BUN: 19 mg/dL (ref 6–23)
CO2: 29 meq/L (ref 19–32)
Calcium: 9.5 mg/dL (ref 8.4–10.5)
Chloride: 99 mEq/L (ref 96–112)
Creatinine, Ser: 0.98 mg/dL (ref 0.40–1.20)
GFR: 58.45 mL/min — AB (ref >60.00)
Glucose, Bld: 109 mg/dL — ABNORMAL HIGH (ref 70–99)
POTASSIUM: 4.6 meq/L (ref 3.5–5.1)
Sodium: 136 mEq/L (ref 135–145)

## 2017-03-18 MED ORDER — LISINOPRIL-HYDROCHLOROTHIAZIDE 20-25 MG PO TABS: 1.0000 | ORAL_TABLET | Freq: Every day | ORAL | 0 refills | Status: DC

## 2017-03-18 MED ORDER — CARVEDILOL 6.25 MG PO TABS: ORAL_TABLET | ORAL | 1 refills | Status: DC

## 2017-03-18 MED ORDER — ATORVASTATIN CALCIUM 20 MG PO TABS: ORAL_TABLET | ORAL | 0 refills | Status: DC

## 2017-03-18 MED ORDER — TOLTERODINE TARTRATE ER 4 MG PO CP24: ORAL_CAPSULE | ORAL | 1 refills | Status: DC

## 2017-03-18 NOTE — Progress Notes (Signed)
Subjective:    Patient ID: Terry Mcneil, female    DOB: 1941/01/08, 76 y.o.   MRN: [id number]  HPI  HTN- maintained on prinizide and coreg.  Denies swelling, CP or SOB. BP Readings from Last 3 Encounters:  03/18/17 117/67  11/05/16 (!) 150/83  10/03/16 115/71   Hyperlipidemia- maintained on lipitor. Denies myalgia. Lab Results  Component Value Date   CHOL 160 08/21/2016   HDL 65.80 08/21/2016   LDLCALC 77 08/21/2016   TRIG 87.0 08/21/2016   CHOLHDL 2 08/21/2016   OAB- maintained on detrol LA. Reports that bladder issues are well controlled.    Breast  CA-  S/p lumpectomy 4/18, completed chemo and will begin 4 weeks of radiation treatment.  Review of Systems See HPI  Past Medical History:  Diagnosis Date  . Arthritis    back  . Cancer ([place]) 09/2016   left breast cancer  . Cataract   . Colon polyps   . Hypertension   . Osteopenia 10/11/2009  . Patellar fracture    Right Knee   . Urge incontinence      Social History   Socioeconomic History  . Marital status: Married    Spouse name: Reeve Mallo  . Number of children: 2  . Years of education: Not on file  . Highest education level: Not on file  Social Needs  . Financial resource strain: Not on file  . Food insecurity - worry: Not on file  . Food insecurity - inability: Not on file  . Transportation needs - medical: Not on file  . Transportation needs - non-medical: Not on file  Occupational History  . Occupation: Retired  Tobacco Use  . Smoking status: Former Smoker    Last attempt to quit: 08/27/1978    Years since quitting: 38.5  . Smokeless tobacco: Never Used  Substance and Sexual Activity  . Alcohol use: Yes    Comment: 3 nights/ week- 2 glasses of wine  . Drug use: No  . Sexual activity: Yes  Other Topics Concern  . 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     Alcohol Use:  Moderate (Wine), 2 glasses 3 times a week   Drug Use:  None   Diet:  Weight Watchers    Exercise:  Not regular   Hobbies: Reading , Gardening , Grandchildren                Past Surgical History:  Procedure Laterality Date  . EYE SURGERY Bilateral    Cataracts   . TONSILLECTOMY    . TUBAL LIGATION    . VAGINAL DELIVERY     x2 -     Family History  Problem Relation Age of Onset  . Heart disease Father 2       valve replacement    No Known Allergies  Current Outpatient Medications on File Prior to Visit  Medication Sig Dispense Refill  . aspirin EC 81 MG tablet Take 81 mg by mouth daily.    Marland Kitchen atorvastatin (LIPITOR) 20 MG tablet TAKE (1/2) TABLET BY MOUTH DAILY 45 tablet 0  . calcium-vitamin D (OSCAL WITH D) 500-200 MG-UNIT  per tablet Take 1 tablet by mouth 2 (two) times daily.    . carvedilol (COREG) 6.25 MG tablet Take 1/2 tablet twice a day. 90 tablet 1  . lisinopril-hydrochlorothiazide (PRINZIDE,ZESTORETIC) 20-25 MG tablet TAKE ONE TABLET BY MOUTH DAILY 90 tablet 0  . Multiple Vitamin (MULTIVITAMIN WITH MINERALS) TABS Take 1 tablet by mouth daily.    Marland Kitchen tolterodine (DETROL LA) 4 MG 24 hr capsule TAKE 1 CAPSULE (4 MG TOTAL) BY MOUTH DAILY. 90 capsule 1   No current facility-administered medications on file prior to visit.     BP 117/67 (BP Location: Right Arm, Cuff Size: Normal)   Pulse 90   Temp 98 F (36.7 C) (Oral)   Resp 18   Ht 5\' 5"  (1.651 m)   Wt 154 lb (69.9 kg)   SpO2 98%   BMI 25.63 kg/m       Objective:   Physical Exam  Constitutional: [name] oriented to person, place, and time. She appears well-developed and well-nourished.  Cardiovascular: Normal rate, regular rhythm and normal heart sounds.  No murmur heard. Pulmonary/Chest: Effort normal and  breath sounds normal. No respiratory distress. She has no wheezes.  Musculoskeletal: She exhibits no edema.  Neurological: [name] alert and oriented to person, place, and time.  Psychiatric: She has a normal mood and affect. Her behavior is normal. Judgment and thought content normal.          Assessment & Plan:  Flu shot today

## 2017-03-18 NOTE — Assessment & Plan Note (Signed)
Stable on current meds, continue same. Obtain follow up bmet.

## 2017-03-18 NOTE — Patient Instructions (Signed)
Please complete lab work prior to leaving.   

## 2017-03-18 NOTE — Addendum Note (Signed)
Addended by: Kelle Darting A on: 03/18/2017 12:20 PM   Modules accepted: Orders

## 2017-03-18 NOTE — Assessment & Plan Note (Signed)
Lipids at goal, tolerating statin.  Continue same.

## 2017-03-18 NOTE — Addendum Note (Signed)
Addended by: [name] on: 03/18/2017 11:47 AM   Modules accepted: Orders

## 2017-03-18 NOTE — Assessment & Plan Note (Signed)
Completed Chemo at [facility] now will begin radiation. Followed by Dr. Laurian Brim

## 2017-03-18 NOTE — Assessment & Plan Note (Signed)
Stable on detrol continue same.

## 2017-05-27 ENCOUNTER — Telehealth: Payer: Self-pay | Admitting: Family

## 2017-05-27 NOTE — Telephone Encounter (Signed)
Attempted to call patient to schedule AWV. Terry Mcneil did not answer. Will try to give patient a call at another time. Last AWV 04/16/2016. SF

## 2017-09-15 ENCOUNTER — Encounter: Payer: Self-pay | Admitting: Family

## 2017-09-15 ENCOUNTER — Ambulatory Visit: Payer: Medicare Other | Admitting: Family

## 2017-09-15 VITALS — BP 128/74 | HR 94 | Temp 97.6°F | Resp 18 | Ht 65.0 in | Wt 150.8 lb

## 2017-09-15 DIAGNOSIS — E785 Hyperlipidemia, unspecified: Secondary | ICD-10-CM | POA: Diagnosis not present

## 2017-09-15 DIAGNOSIS — F329 Major depressive disorder, single episode, unspecified: Secondary | ICD-10-CM

## 2017-09-15 DIAGNOSIS — F32A Depression, unspecified: Secondary | ICD-10-CM

## 2017-09-15 DIAGNOSIS — I10 Essential (primary) hypertension: Secondary | ICD-10-CM

## 2017-09-15 DIAGNOSIS — N3281 Overactive bladder: Secondary | ICD-10-CM

## 2017-09-15 LAB — COMPREHENSIVE METABOLIC PANEL
ALBUMIN: 4.1 g/dL (ref 3.5–5.2)
ALK PHOS: 78 U/L (ref 39–117)
ALT: 18 U/L (ref 0–35)
AST: 24 U/L (ref 0–37)
BILIRUBIN TOTAL: 0.5 mg/dL (ref 0.2–1.2)
BUN: 22 mg/dL (ref 6–23)
CALCIUM: 9.5 mg/dL (ref 8.4–10.5)
CHLORIDE: 97 meq/L (ref 96–112)
CO2: 29 meq/L (ref 19–32)
CREATININE: 0.93 mg/dL (ref 0.40–1.20)
GFR: 62.01 mL/min (ref >60.00)
Glucose, Bld: 99 mg/dL (ref 70–99)
POTASSIUM: 3.5 meq/L (ref 3.5–5.1)
Sodium: 136 mEq/L (ref 135–145)
Total Protein: 6.8 g/dL (ref 6.0–8.3)

## 2017-09-15 LAB — LIPID PANEL
CHOL/HDL RATIO: 2
Cholesterol: 135 mg/dL (ref 0–200)
HDL: 54.6 mg/dL (ref >39.00)
LDL Cholesterol: 56 mg/dL (ref 0–99)
NONHDL: 80.22
TRIGLYCERIDES: 119 mg/dL (ref 0.0–149.0)
VLDL: 23.8 mg/dL (ref 0.0–40.0)

## 2017-09-15 MED ORDER — ATORVASTATIN CALCIUM 20 MG PO TABS: ORAL_TABLET | ORAL | 1 refills | Status: DC

## 2017-09-15 MED ORDER — CARVEDILOL 6.25 MG PO TABS: ORAL_TABLET | ORAL | 1 refills | Status: DC

## 2017-09-15 MED ORDER — TOLTERODINE TARTRATE ER 4 MG PO CP24: ORAL_CAPSULE | ORAL | 1 refills | Status: DC

## 2017-09-15 MED ORDER — LISINOPRIL-HYDROCHLOROTHIAZIDE 20-25 MG PO TABS: 1.0000 | ORAL_TABLET | Freq: Every day | ORAL | 1 refills | Status: DC

## 2017-09-15 NOTE — Patient Instructions (Addendum)
Please complete lab work prior to leaving.   

## 2017-09-15 NOTE — Progress Notes (Signed)
Subjective:    Patient ID: Terry Mcneil, female    DOB: 04/15/1941, 76 y.o.   MRN: [id number]  HPI  Ms. Terry Mcneil is a 77 yr old female who presents today for follow up.  1) HTN- continues lisinopril-hctz and carvedilol. BP Readings from Last 3 Encounters:  09/15/17 128/74  03/18/17 117/67  11/05/16 (!) 150/83    2) Hyperlipidemia- continues atorvastatin, denies myalgia.  Lab Results  Component Value Date   CHOL 160 08/21/2016   HDL 65.80 08/21/2016   LDLCALC 77 08/21/2016   TRIG 87.0 08/21/2016   CHOLHDL 2 08/21/2016   3) OAB- continues detrol LA and reports that this really help she rsymptoms.   Review of Systems See HPI  Past Medical History:  Diagnosis Date  . Arthritis    back  . Cancer ([facility]) 09/2016   left breast cancer  . Cataract   . Colon polyps   . Hypertension   . Osteopenia 10/11/2009  . Patellar fracture    Right Knee   . Urge incontinence      Social History   Socioeconomic History  . Marital status: Married    Spouse name: Terry Mcneil  . Number of children: 2  . Years of education: Not on file  . Highest education level: Not on file  Occupational History  . Occupation: Retired  Scientific laboratory technician  . Financial resource strain: Not on file  . Food insecurity:    Worry: Not on file    Inability: Not on file  . Transportation needs:    Medical: Not on file    Non-medical: Not on file  Tobacco Use  . Smoking status: Former Smoker    Last attempt to quit: 08/27/1978    Years since quitting: 39.0  . Smokeless tobacco: Never Used  Substance and Sexual Activity  . Alcohol use: Yes    Comment: 3 nights/ week- 2 glasses of wine  . Drug use: No  . Sexual activity: Yes  Lifestyle  . Physical activity:    Days per week: Not on file    Minutes per session: Not on file  . Stress: Not on file  Relationships  . Social connections:    Talks on phone: Not on file    Gets together: Not on file    Attends religious service: Not on file    Active  member of club or organization: Not on file    Attends meetings of clubs or organizations: Not on file    Relationship status: Not on file  . Intimate partner violence:    Fear of current or ex partner: Not on file    Emotionally abused: Not on file    Physically abused: Not on file    Forced sexual activity: Not on file  Other Topics Concern  . 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     Alcohol Use:  Moderate (Wine), 2 glasses 3 times a week   Drug Use:  None   Diet:  Weight Watchers    Exercise:  Not regular   Hobbies: Reading , Gardening , Grandchildren  Past Surgical History:  Procedure Laterality Date  . EYE SURGERY Bilateral    Cataracts   . ORIF PATELLA Right 08/28/2012   Procedure: OPEN REDUCTION INTERNAL (ORIF) FIXATION PATELLA- RIGHT;  Surgeon: Newt Minion, MD;  Location: [facility];  Service: Orthopedics;  Laterality: Right;  Open Reduction Internal Fixation Right Patella  . RADIOACTIVE SEED GUIDED PARTIAL MASTECTOMY WITH AXILLARY SENTINEL LYMPH NODE BIOPSY Left 10/03/2016   Procedure: LEFT BREAST RADIOACTIVE SEED GUIDED PARTIAL MASTECTOMY WITH LEFT SENTINEL LYMPH NODE MAPPING;  Surgeon: Erroll Luna, MD;  Location: [facility];  Service: General;  Laterality: Left;  . TONSILLECTOMY    . TUBAL LIGATION    . VAGINAL DELIVERY     x2 -     Family History  Problem Relation Age of Onset  . Heart disease Father 39       valve replacement    No Known Allergies  Current Outpatient Medications on File Prior to Visit  Medication Sig Dispense Refill  . calcium-vitamin D (OSCAL WITH D) 500-200 MG-UNIT per tablet Take 1 tablet by mouth 2  (two) times daily.    . Multiple Vitamin (MULTIVITAMIN WITH MINERALS) TABS Take 1 tablet by mouth daily.     No current facility-administered medications on file prior to visit.     BP 128/74 (BP Location: Right Arm, Cuff Size: Normal)   Pulse 94   Temp 97.6 F (36.4 C) (Oral)   Resp 18   Ht 5\' 5"  (1.651 m)   Wt 150 lb 12.8 oz (68.4 kg)   SpO2 98%   BMI 25.09 kg/m       Objective:   Physical Exam  Constitutional: She appears well-developed and well-nourished.  Cardiovascular: Normal rate, regular rhythm and normal heart sounds.  No murmur heard. Pulmonary/Chest: Effort normal and breath sounds normal. No respiratory distress. She has no wheezes.  Skin: Skin is warm and dry.  Psychiatric: She has a normal mood and affect. Her behavior is normal. Judgment and thought content normal.          Assessment & Plan:  Depression- scored 9 on PHQ-9. Reports that her depression relates to her cancer diagnosis, joint pain and stress related to her cancer treatments. She declines medication or referral at this time but agrees to let me know if her symptoms worsen or if they fail to improve.  Hyperlipidemia- tolerating statin, obtain lipid panel.   OAB- stable on detrol, continue same.

## 2017-12-11 ENCOUNTER — Encounter (INDEPENDENT_AMBULATORY_CARE_PROVIDER_SITE_OTHER): Payer: Self-pay | Admitting: Orthopedic Surgery

## 2017-12-11 ENCOUNTER — Ambulatory Visit (INDEPENDENT_AMBULATORY_CARE_PROVIDER_SITE_OTHER): Payer: Self-pay

## 2017-12-11 ENCOUNTER — Ambulatory Visit (INDEPENDENT_AMBULATORY_CARE_PROVIDER_SITE_OTHER): Payer: Medicare Other | Admitting: Orthopedic Surgery

## 2017-12-11 VITALS — Ht 65.0 in | Wt 150.0 lb

## 2017-12-11 DIAGNOSIS — M25552 Pain in left hip: Secondary | ICD-10-CM | POA: Diagnosis not present

## 2017-12-11 DIAGNOSIS — M25561 Pain in right knee: Secondary | ICD-10-CM

## 2017-12-11 DIAGNOSIS — M25562 Pain in left knee: Secondary | ICD-10-CM | POA: Diagnosis not present

## 2017-12-11 DIAGNOSIS — M170 Bilateral primary osteoarthritis of knee: Secondary | ICD-10-CM

## 2017-12-11 DIAGNOSIS — G8929 Other chronic pain: Secondary | ICD-10-CM

## 2017-12-14 ENCOUNTER — Encounter (INDEPENDENT_AMBULATORY_CARE_PROVIDER_SITE_OTHER): Payer: Self-pay | Admitting: Orthopedic Surgery

## 2017-12-14 DIAGNOSIS — M170 Bilateral primary osteoarthritis of knee: Secondary | ICD-10-CM

## 2017-12-14 NOTE — Progress Notes (Signed)
Office Visit Note   Patient: Terry Mcneil           Date of Birth: Jan 29, 1941           MRN: [id number] Visit Date: 12/11/2017              Requested by: [name], NP [address] [place], [place] [address] PCP: [name], NP  Chief Complaint  Patient presents with  . Left Hip - Pain  . Left Knee - Pain  . Right Knee - Pain      HPI: Patient is a 77 year old woman who complains of left hip and groin pain with popping.  Patient states she is recently being treated for breast cancer with multiple chemotherapy regimens.  She states she feels like the chemotherapy treatment has caused significant arthritis in both knees and her left hip.  She complains of bilateral knee pain denies any swelling states she has decreased range of motion.  Assessment & Plan: Visit Diagnoses:  1. Chronic pain of right knee   2. Chronic pain of left knee   3. Pain in left hip   4. Bilateral primary osteoarthritis of knee     Plan: Patient has severe avascular necrosis collapse with subluxation of the left hip.  Will have patient follow-up with Dr. Ninfa Linden as soon as possible for a left total hip arthroplasty.  Both knees were injected without complications.  Follow-Up Instructions: Return in about 1 week (around 12/18/2017).   Ortho Exam  Patient is alert, oriented, no adenopathy, well-dressed, normal affect, normal respiratory effort. Examination patient has an antalgic gait with an abductor lurch to the left.  She has essentially no range of motion of the left hip with internal and external rotation.  Examination of both knees she is tender to palpation over the medial and lateral joint line.  The patellofemoral joint is minimally tender to palpation she has no pain in the patellofemoral joint of the right knee that is status post patella fracture fixation.  There is no effusion of either knee there is crepitation with range of motion of both knees.  Collaterals and  cruciates are stable bilaterally.  Imaging: No results found. No images are attached to the encounter.  Labs: Lab Results  Component Value Date   HGBA1C 5.5 03/14/2015   HGBA1C 5.6 03/09/2014     Lab Results  Component Value Date   ALBUMIN 4.1 09/15/2017   ALBUMIN 4.1 09/30/2016   ALBUMIN 4.4 02/07/2014    Body mass index is 24.96 kg/m.  Orders:  Orders Placed This Encounter  Procedures  . XR HIP UNILAT W OR W/O PELVIS 2-3 VIEWS LEFT  . XR Knee 1-2 Views Right  . XR Knee 1-2 Views Left   No orders of the defined types were placed in this encounter.    Procedures: Large Joint Inj: bilateral knee on 12/14/2017 11:25 AM Indications: pain and diagnostic evaluation Details: 22 G 1.5 in needle, anteromedial approach  Arthrogram: No  Outcome: tolerated well, no immediate complications Procedure, treatment alternatives, risks and benefits explained, specific risks discussed. Consent was given by the patient. Immediately prior to procedure a time out was called to verify the correct patient, procedure, equipment, support staff and site/side marked as required. Patient was prepped and draped in the usual sterile fashion.      Clinical Data: No additional findings.  ROS:  All other systems negative, except as noted in the HPI. Review of Systems  Objective:  Vital Signs: Ht 5\' 5"  (1.651 m)   Wt 150 lb (68 kg)   BMI 24.96 kg/m   Specialty Comments:  No specialty comments available.  PMFS History: Patient Active Problem List   Diagnosis Date Noted  . Breast cancer ([facility]) 03/18/2017  . Malignant neoplasm of upper-outer quadrant of left breast in female, estrogen receptor positive ([facility]) 11/05/2016  . Insomnia 01/16/2016  . Lower back pain 06/10/2014  . Osteopenia 02/07/2014  . Other malaise and fatigue 04/17/2013  . Routine general medical examination at a health care facility 04/11/2013  . Need for prophylactic vaccination and inoculation against influenza  04/11/2013  . Essential hypertension, benign 10/15/2012  . Hyperlipidemia 10/15/2012  . Overactive bladder 10/15/2012   Past Medical History:  Diagnosis Date  . Arthritis    back  . Cancer ([facility]) 09/2016   left breast cancer  . Cataract   . Colon polyps   . Hypertension   . Osteopenia 10/11/2009  . Patellar fracture    Right Knee   . Urge incontinence     Family History  Problem Relation Age of Onset  . Heart disease Father 38       valve replacement    Past Surgical History:  Procedure Laterality Date  . EYE SURGERY Bilateral    Cataracts   . ORIF PATELLA Right 08/28/2012   Procedure: OPEN REDUCTION INTERNAL (ORIF) FIXATION PATELLA- RIGHT;  Surgeon: Newt Minion, MD;  Location: [facility];  Service: Orthopedics;  Laterality: Right;  Open Reduction Internal Fixation Right Patella  . RADIOACTIVE SEED GUIDED PARTIAL MASTECTOMY WITH AXILLARY SENTINEL LYMPH NODE BIOPSY Left 10/03/2016   Procedure: LEFT BREAST RADIOACTIVE SEED GUIDED PARTIAL MASTECTOMY WITH LEFT SENTINEL LYMPH NODE MAPPING;  Surgeon: Erroll Luna, MD;  Location: [facility];  Service: General;  Laterality: Left;  . TONSILLECTOMY    . TUBAL LIGATION    . VAGINAL DELIVERY     x2 -    Social History   Occupational History  . Occupation: Retired  Tobacco Use  . Smoking status: Former Smoker    Last attempt to quit: 08/27/1978    Years since quitting: 39.3  . Smokeless tobacco: Never Used  Substance and Sexual Activity  . Alcohol use: Yes    Comment: 3 nights/ week- 2 glasses of wine  . Drug use: No  . Sexual activity: Yes

## 2017-12-17 ENCOUNTER — Encounter (INDEPENDENT_AMBULATORY_CARE_PROVIDER_SITE_OTHER): Payer: Self-pay | Admitting: Physician Assistant

## 2017-12-17 ENCOUNTER — Ambulatory Visit (INDEPENDENT_AMBULATORY_CARE_PROVIDER_SITE_OTHER): Payer: Medicare Other | Admitting: Physician Assistant

## 2017-12-17 DIAGNOSIS — M87052 Idiopathic aseptic necrosis of left femur: Secondary | ICD-10-CM | POA: Diagnosis not present

## 2017-12-17 NOTE — Progress Notes (Signed)
Office Visit Note   Patient: Terry Mcneil           Date of Birth: 01-Nov-1940           MRN: [id number] Visit Date: 12/17/2017              Requested by: [name], NP [address] [place], [place] [address] PCP: [name], NP   Assessment & Plan: Visit Diagnoses:  1. Avascular necrosis of bone of hip, left (HCC)     Plan: Plan due to the fact the patient has severe pain that is affecting her life the findings of decreased range of motion and severe pain with range of motion on exam along with x-ray findings of avascular necrosis and subluxation of the hip recommend left total hip arthroplasty.  Patient would like to proceed with this near future.  Risk including but not limited to infection, wound healing problems, blood loss, prolonged pain and nerve or vessel injury all discussed.  Handout on anterior total hip arthroplasty was given to the patient.  Questions were encouraged and answered with patient and her husband is present today.  We will proceed with surgery in the near future.  Have Dr. Ninfa Linden call the patient upon his return.  Follow-Up Instructions: Return for 2 weeks postop.   Orders:  No orders of the defined types were placed in this encounter.  No orders of the defined types were placed in this encounter.     Procedures: No procedures performed   Clinical Data: No additional findings.   Subjective: Chief Complaint  Patient presents with  . Left Hip - Pain    HPI Terry Mcneil is a 77 year old female who comes in today at the request of Dr. due to for evaluation of the left hip avascular necrosis and pain.  [name] recently been treated for breast cancer undergone chemotherapy.  She states that her hip pain has been ongoing for just a short period of time.  She has had no injury to the hip.  [name] now ambulating with a walker due to the hip pain and instability.  States that the hip pain keeps her from doing any form of  exercise is greatly affecting her life.   AP pelvis shows left hip with subchondral cyst consistent with avascular necrosis.  The head is misshapen oval shaped.  Femoral head is subluxed superior with almost complete dislocation.  No acute fracture. Review of Systems Please see HPI otherwise negative  Objective: Vital Signs: There were no vitals taken for this visit.  Physical Exam  Constitutional: [name] oriented to person, place, and time. She appears well-developed and well-nourished. No distress.  Cardiovascular: Intact distal pulses.  Pulmonary/Chest: Effort normal.  Neurological: [name] alert and oriented to person, place, and time.  Skin: [name] not diaphoretic.  Psychiatric: She has a normal mood and affect.    Ortho Exam Right hip full range of motion without pain.  Left hip very limited range of motion and pain especially with attempts of internal rotation.  She has leg length discrepancy with the left leg being shorter than the right.  Ambulates with a rolling walker antalgic gait.  Specialty Comments:  No specialty comments available.  Imaging: No results found.   PMFS History: Patient Active Problem List   Diagnosis Date Noted  . Breast cancer ([facility]) 03/18/2017  . Malignant neoplasm of upper-outer quadrant of left breast in female, estrogen receptor positive ([facility]) 11/05/2016  .  Insomnia 01/16/2016  . Lower back pain 06/10/2014  . Osteopenia 02/07/2014  . Other malaise and fatigue 04/17/2013  . Routine general medical examination at a health care facility 04/11/2013  . Need for prophylactic vaccination and inoculation against influenza 04/11/2013  . Essential hypertension, benign 10/15/2012  . Hyperlipidemia 10/15/2012  . Overactive bladder 10/15/2012   Past Medical History:  Diagnosis Date  . Arthritis    back  . Cancer ([facility]) 09/2016   left breast cancer  . Cataract   . Colon polyps   . Hypertension   . Osteopenia 10/11/2009  . Patellar fracture    Right  Knee   . Urge incontinence     Family History  Problem Relation Age of Onset  . Heart disease Father 71       valve replacement    Past Surgical History:  Procedure Laterality Date  . EYE SURGERY Bilateral    Cataracts   . ORIF PATELLA Right 08/28/2012   Procedure: OPEN REDUCTION INTERNAL (ORIF) FIXATION PATELLA- RIGHT;  Surgeon: Newt Minion, MD;  Location: [facility];  Service: Orthopedics;  Laterality: Right;  Open Reduction Internal Fixation Right Patella  . RADIOACTIVE SEED GUIDED PARTIAL MASTECTOMY WITH AXILLARY SENTINEL LYMPH NODE BIOPSY Left 10/03/2016   Procedure: LEFT BREAST RADIOACTIVE SEED GUIDED PARTIAL MASTECTOMY WITH LEFT SENTINEL LYMPH NODE MAPPING;  Surgeon: Erroll Luna, MD;  Location: [facility];  Service: General;  Laterality: Left;  . TONSILLECTOMY    . TUBAL LIGATION    . VAGINAL DELIVERY     x2 -    Social History   Occupational History  . Occupation: Retired  Tobacco Use  . Smoking status: Former Smoker    Last attempt to quit: 08/27/1978    Years since quitting: 39.3  . Smokeless tobacco: Never Used  Substance and Sexual Activity  . Alcohol use: Yes    Comment: 3 nights/ week- 2 glasses of wine  . Drug use: No  . Sexual activity: Yes

## 2018-01-02 ENCOUNTER — Other Ambulatory Visit (INDEPENDENT_AMBULATORY_CARE_PROVIDER_SITE_OTHER): Payer: Self-pay | Admitting: Orthopaedic Surgery

## 2018-01-02 ENCOUNTER — Other Ambulatory Visit (INDEPENDENT_AMBULATORY_CARE_PROVIDER_SITE_OTHER): Payer: Self-pay

## 2018-01-02 DIAGNOSIS — M87052 Idiopathic aseptic necrosis of left femur: Secondary | ICD-10-CM

## 2018-01-05 NOTE — Pre-Procedure Instructions (Signed)
[facility]  01/05/2018      [place], [address] [place] [address] Phone: [phone] Fax: [phone]    Your procedure is scheduled on September 3  Report to [facility] at [phone] A.M.  Call this number if you have problems the morning of surgery:  [phone]   Remember:  Do not eat or drink after midnight.      Take these medicines the morning of surgery with A SIP OF WATER  carvedilol (COREG) tamoxifen (NOLVADEX) tolterodine (DETROL LA  7 days prior to surgery STOP taking any Aspirin(unless otherwise instructed by your surgeon), Aleve, Naproxen, Ibuprofen, Motrin, Advil, Goody's, BC's, all herbal medications, fish oil, and all vitamins     Do not wear jewelry, make-up or nail polish.  Do not wear lotions, powders, or perfumes, or deodorant.  Do not shave 48 hours prior to surgery.    Do not bring valuables to the hospital.  [facility] is not responsible for any belongings or valuables.  Contacts, dentures or bridgework may not be worn into surgery.  Leave your suitcase in the car.  After surgery it may be brought to your room.  For patients admitted to the hospital, discharge time will be determined by your treatment team.  Patients discharged the day of surgery will not be allowed to drive home.    Special instructions:   [facility]- Preparing For Surgery  Before surgery, you can play an important role. Because skin is not sterile, your skin needs to be as free of germs as possible. You can reduce the number of germs on your skin by washing with CHG (chlorahexidine gluconate) Soap before surgery.  CHG is an antiseptic cleaner which kills germs and bonds with the skin to continue killing germs even after washing.    Oral Hygiene is also important to reduce your risk of infection.  Remember - BRUSH YOUR TEETH THE MORNING OF SURGERY WITH YOUR  REGULAR TOOTHPASTE  Please do not use if you have an allergy to CHG or antibacterial soaps. If your skin becomes reddened/irritated stop using the CHG.  Do not shave (including legs and underarms) for at least 48 hours prior to first CHG shower. It is OK to shave your face.  Please follow these instructions carefully.   1. Shower the NIGHT BEFORE SURGERY and the MORNING OF SURGERY with CHG.   2. If you chose to wash your hair, wash your hair first as usual with your normal shampoo.  3. After you shampoo, rinse your hair and body thoroughly to remove the shampoo.  4. Use CHG as you would any other liquid soap. You can apply CHG directly to the skin and wash gently with a scrungie or a clean washcloth.   5. Apply the CHG Soap to your body ONLY FROM THE NECK DOWN.  Do not use on open wounds or open sores. Avoid contact with your eyes, ears, mouth and genitals (private parts). Wash Face and genitals (private parts)  with your normal soap.  6. Wash thoroughly, paying special attention to the area where your surgery will be performed.  7. Thoroughly rinse your body with warm water from the neck down.  8. DO NOT shower/wash with your normal soap after using and rinsing off the CHG Soap.  9. Pat yourself dry with a CLEAN TOWEL.  10. Wear CLEAN PAJAMAS  to bed the night before surgery, wear comfortable clothes the morning of surgery  11. Place CLEAN SHEETS on your bed the night of your first shower and DO NOT SLEEP WITH PETS.    Day of Surgery:  Do not apply any deodorants/lotions.  Please wear clean clothes to the hospital/surgery center.   Remember to brush your teeth WITH YOUR REGULAR TOOTHPASTE.    Please read over the following fact sheets that you were given.

## 2018-01-06 ENCOUNTER — Other Ambulatory Visit: Payer: Self-pay

## 2018-01-06 ENCOUNTER — Encounter (HOSPITAL_COMMUNITY): Payer: Self-pay

## 2018-01-06 ENCOUNTER — Encounter (HOSPITAL_COMMUNITY)
Admission: RE | Admit: 2018-01-06 | Discharge: 2018-01-06 | Disposition: A | Discharge status: Home / Self Care | Payer: Medicare Other | Source: Ambulatory Visit | Attending: Orthopaedic Surgery | Admitting: Orthopaedic Surgery

## 2018-01-06 DIAGNOSIS — Z01812 Encounter for preprocedural laboratory examination: Secondary | ICD-10-CM | POA: Insufficient documentation

## 2018-01-06 DIAGNOSIS — Z01810 Encounter for preprocedural cardiovascular examination: Secondary | ICD-10-CM | POA: Insufficient documentation

## 2018-01-06 DIAGNOSIS — I493 Ventricular premature depolarization: Secondary | ICD-10-CM | POA: Insufficient documentation

## 2018-01-06 LAB — BASIC METABOLIC PANEL
ANION GAP: 12 (ref 5–15)
BUN: 30 mg/dL — ABNORMAL HIGH (ref 8–23)
CO2: 23 mmol/L (ref 22–32)
Calcium: 9.3 mg/dL (ref 8.9–10.3)
Chloride: 103 mmol/L (ref 98–111)
Creatinine, Ser: 1.12 mg/dL — ABNORMAL HIGH (ref 0.44–1.00)
GFR calc non Af Amer: 46 mL/min — ABNORMAL LOW (ref >60)
GFR, EST AFRICAN AMERICAN: 53 mL/min — AB (ref >60)
GLUCOSE: 111 mg/dL — AB (ref 70–99)
POTASSIUM: 4.1 mmol/L (ref 3.5–5.1)
Sodium: 138 mmol/L (ref 135–145)

## 2018-01-06 LAB — CBC
HEMATOCRIT: 38.4 % (ref 36.0–46.0)
HEMOGLOBIN: 12.1 g/dL (ref 12.0–15.0)
MCH: 31.6 pg (ref 26.0–34.0)
MCHC: 31.5 g/dL (ref 30.0–36.0)
MCV: 100.3 fL — AB (ref 78.0–100.0)
Platelets: 209 10*3/uL (ref 150–400)
RBC: 3.83 MIL/uL — AB (ref 3.87–5.11)
RDW: 12.8 % (ref 11.5–15.5)
WBC: 7 10*3/uL (ref 4.0–10.5)

## 2018-01-06 LAB — SURGICAL PCR SCREEN
MRSA, PCR: NEGATIVE
Staphylococcus aureus: POSITIVE — AB

## 2018-01-06 NOTE — Progress Notes (Signed)
PCP - [name]  Cardiologist - denies  Chest x-ray - not needed EKG - 01/06/18 Stress Test - > 10 years ECHO - denies Cardiac Cath - denies  Anesthesia review: NO  Patient denies shortness of breath, fever, cough and chest pain at PAT appointment   Patient verbalized understanding of instructions that were given to them at the PAT appointment. Patient was also instructed that they will need to review over the PAT instructions again at home before surgery.

## 2018-01-06 NOTE — Progress Notes (Signed)
Prescription called in at [facility] [phone], patient notified via voicemail with permission

## 2018-01-09 MED ORDER — TRANEXAMIC ACID 1000 MG/10ML IV SOLN
1000.0000 mg | INTRAVENOUS | Status: AC
  Administered 2018-01-13: 1000 mg via INTRAVENOUS
  Filled 2018-01-09: qty 10
  Filled 2018-01-09: qty 1000

## 2018-01-13 ENCOUNTER — Inpatient Hospital Stay (HOSPITAL_COMMUNITY)
Admission: RE | Admit: 2018-01-13 | Discharge: 2018-01-15 | DRG: 470 | Disposition: A | Discharge status: Home Health | Payer: Medicare Other | Attending: Orthopaedic Surgery | Admitting: Orthopaedic Surgery

## 2018-01-13 ENCOUNTER — Encounter (HOSPITAL_COMMUNITY): Payer: Self-pay | Admitting: Surgery

## 2018-01-13 ENCOUNTER — Inpatient Hospital Stay (HOSPITAL_COMMUNITY): Payer: Medicare Other

## 2018-01-13 ENCOUNTER — Encounter (HOSPITAL_COMMUNITY)
Admission: RE | Disposition: A | Discharge status: Home Health | Payer: Self-pay | Source: Home / Self Care | Attending: Orthopaedic Surgery

## 2018-01-13 ENCOUNTER — Inpatient Hospital Stay (HOSPITAL_COMMUNITY): Payer: Medicare Other | Admitting: Anesthesiology

## 2018-01-13 ENCOUNTER — Other Ambulatory Visit: Payer: Self-pay

## 2018-01-13 DIAGNOSIS — Z170 Estrogen receptor positive status [ER+]: Secondary | ICD-10-CM

## 2018-01-13 DIAGNOSIS — E785 Hyperlipidemia, unspecified: Secondary | ICD-10-CM | POA: Diagnosis present

## 2018-01-13 DIAGNOSIS — M879 Osteonecrosis, unspecified: Secondary | ICD-10-CM | POA: Diagnosis present

## 2018-01-13 DIAGNOSIS — M1612 Unilateral primary osteoarthritis, left hip: Secondary | ICD-10-CM | POA: Diagnosis present

## 2018-01-13 DIAGNOSIS — M87052 Idiopathic aseptic necrosis of left femur: Secondary | ICD-10-CM

## 2018-01-13 DIAGNOSIS — Z96642 Presence of left artificial hip joint: Secondary | ICD-10-CM

## 2018-01-13 DIAGNOSIS — Z419 Encounter for procedure for purposes other than remedying health state, unspecified: Secondary | ICD-10-CM

## 2018-01-13 DIAGNOSIS — D62 Acute posthemorrhagic anemia: Secondary | ICD-10-CM | POA: Diagnosis not present

## 2018-01-13 DIAGNOSIS — Z87891 Personal history of nicotine dependence: Secondary | ICD-10-CM

## 2018-01-13 DIAGNOSIS — Z9181 History of falling: Secondary | ICD-10-CM

## 2018-01-13 DIAGNOSIS — M25552 Pain in left hip: Secondary | ICD-10-CM | POA: Diagnosis present

## 2018-01-13 DIAGNOSIS — Z853 Personal history of malignant neoplasm of breast: Secondary | ICD-10-CM | POA: Diagnosis not present

## 2018-01-13 DIAGNOSIS — I10 Essential (primary) hypertension: Secondary | ICD-10-CM | POA: Diagnosis present

## 2018-01-13 HISTORY — DX: Presence of left artificial hip joint: Z96.642

## 2018-01-13 HISTORY — PX: TOTAL HIP ARTHROPLASTY: SHX124

## 2018-01-13 SURGERY — ARTHROPLASTY, HIP, TOTAL, ANTERIOR APPROACH
Anesthesia: Monitor Anesthesia Care | Site: Hip | Laterality: Left

## 2018-01-13 MED ORDER — TAMOXIFEN CITRATE 10 MG PO TABS
20.0000 mg | ORAL_TABLET | Freq: Every day | ORAL | Status: DC
  Administered 2018-01-14 – 2018-01-15 (×2): 20 mg via ORAL
  Filled 2018-01-13 (×3): qty 2

## 2018-01-13 MED ORDER — TRAZODONE HCL 50 MG PO TABS
50.0000 mg | ORAL_TABLET | Freq: Every day | ORAL | Status: DC
  Administered 2018-01-13 – 2018-01-14 (×2): 50 mg via ORAL
  Filled 2018-01-13 (×2): qty 1

## 2018-01-13 MED ORDER — ATORVASTATIN CALCIUM 10 MG PO TABS
10.0000 mg | ORAL_TABLET | Freq: Every day | ORAL | Status: DC
  Administered 2018-01-13 – 2018-01-14 (×2): 10 mg via ORAL
  Filled 2018-01-13 (×2): qty 1

## 2018-01-13 MED ORDER — METHOCARBAMOL 500 MG PO TABS
ORAL_TABLET | ORAL | Status: AC
  Filled 2018-01-13: qty 1

## 2018-01-13 MED ORDER — DOCUSATE SODIUM 100 MG PO CAPS
100.0000 mg | ORAL_CAPSULE | Freq: Two times a day (BID) | ORAL | Status: DC
  Administered 2018-01-13 – 2018-01-15 (×4): 100 mg via ORAL
  Filled 2018-01-13 (×4): qty 1

## 2018-01-13 MED ORDER — DIPHENHYDRAMINE HCL 12.5 MG/5ML PO ELIX: 12.5000 mg | ORAL_SOLUTION | ORAL | Status: DC | PRN

## 2018-01-13 MED ORDER — SODIUM CHLORIDE 0.9 % IV SOLN
INTRAVENOUS | Status: DC
  Administered 2018-01-13 – 2018-01-14 (×4): via INTRAVENOUS

## 2018-01-13 MED ORDER — SODIUM CHLORIDE 0.9 % IR SOLN
Status: DC | PRN
  Administered 2018-01-13: 3000 mL

## 2018-01-13 MED ORDER — LISINOPRIL 20 MG PO TABS
20.0000 mg | ORAL_TABLET | Freq: Every day | ORAL | Status: DC
  Filled 2018-01-13: qty 1

## 2018-01-13 MED ORDER — PANTOPRAZOLE SODIUM 40 MG PO TBEC
40.0000 mg | DELAYED_RELEASE_TABLET | Freq: Every day | ORAL | Status: DC
  Administered 2018-01-14 – 2018-01-15 (×2): 40 mg via ORAL
  Filled 2018-01-13 (×3): qty 1

## 2018-01-13 MED ORDER — HYDROCHLOROTHIAZIDE 25 MG PO TABS
25.0000 mg | ORAL_TABLET | Freq: Every day | ORAL | Status: DC
  Filled 2018-01-13 (×2): qty 1

## 2018-01-13 MED ORDER — OXYCODONE HCL 5 MG PO TABS: 10.0000 mg | ORAL_TABLET | ORAL | Status: DC | PRN

## 2018-01-13 MED ORDER — CARVEDILOL 3.125 MG PO TABS
3.1250 mg | ORAL_TABLET | Freq: Two times a day (BID) | ORAL | Status: DC
  Filled 2018-01-13 (×2): qty 1

## 2018-01-13 MED ORDER — MENTHOL 3 MG MT LOZG: 1.0000 | LOZENGE | OROMUCOSAL | Status: DC | PRN

## 2018-01-13 MED ORDER — OXYCODONE HCL 5 MG PO TABS
5.0000 mg | ORAL_TABLET | ORAL | Status: DC | PRN
  Administered 2018-01-13 (×2): 10 mg via ORAL
  Administered 2018-01-14: 5 mg via ORAL
  Administered 2018-01-14 (×2): 10 mg via ORAL
  Administered 2018-01-15 (×2): 5 mg via ORAL
  Filled 2018-01-13: qty 2
  Filled 2018-01-13 (×2): qty 1
  Filled 2018-01-13 (×3): qty 2

## 2018-01-13 MED ORDER — PHENOL 1.4 % MT LIQD: 1.0000 | OROMUCOSAL | Status: DC | PRN

## 2018-01-13 MED ORDER — PHENYLEPHRINE 40 MCG/ML (10ML) SYRINGE FOR IV PUSH (FOR BLOOD PRESSURE SUPPORT)
PREFILLED_SYRINGE | INTRAVENOUS | Status: AC
  Filled 2018-01-13: qty 10

## 2018-01-13 MED ORDER — ACETAMINOPHEN 325 MG PO TABS
325.0000 mg | ORAL_TABLET | Freq: Four times a day (QID) | ORAL | Status: DC | PRN
  Administered 2018-01-13: 650 mg via ORAL
  Filled 2018-01-13: qty 2

## 2018-01-13 MED ORDER — OXYCODONE HCL 5 MG PO TABS
ORAL_TABLET | ORAL | Status: AC
  Filled 2018-01-13: qty 2

## 2018-01-13 MED ORDER — EPHEDRINE SULFATE-NACL 50-0.9 MG/10ML-% IV SOSY
PREFILLED_SYRINGE | INTRAVENOUS | Status: DC | PRN
  Administered 2018-01-13: 10 mg via INTRAVENOUS

## 2018-01-13 MED ORDER — ALUM & MAG HYDROXIDE-SIMETH 200-200-20 MG/5ML PO SUSP: 30.0000 mL | ORAL | Status: DC | PRN

## 2018-01-13 MED ORDER — METOCLOPRAMIDE HCL 5 MG/ML IJ SOLN: 5.0000 mg | Freq: Three times a day (TID) | INTRAMUSCULAR | Status: DC | PRN

## 2018-01-13 MED ORDER — FESOTERODINE FUMARATE ER 8 MG PO TB24
8.0000 mg | ORAL_TABLET | Freq: Every day | ORAL | Status: DC
  Administered 2018-01-14 – 2018-01-15 (×2): 8 mg via ORAL
  Filled 2018-01-13 (×3): qty 1

## 2018-01-13 MED ORDER — FENTANYL CITRATE (PF) 250 MCG/5ML IJ SOLN
INTRAMUSCULAR | Status: DC | PRN
  Administered 2018-01-13: 50 ug via INTRAVENOUS

## 2018-01-13 MED ORDER — HYDROMORPHONE HCL 1 MG/ML IJ SOLN
0.5000 mg | INTRAMUSCULAR | Status: DC | PRN
  Administered 2018-01-14: 0.5 mg via INTRAVENOUS
  Filled 2018-01-13 (×2): qty 1

## 2018-01-13 MED ORDER — FENTANYL CITRATE (PF) 100 MCG/2ML IJ SOLN: 25.0000 ug | INTRAMUSCULAR | Status: DC | PRN

## 2018-01-13 MED ORDER — METOCLOPRAMIDE HCL 5 MG PO TABS: 5.0000 mg | ORAL_TABLET | Freq: Three times a day (TID) | ORAL | Status: DC | PRN

## 2018-01-13 MED ORDER — ONDANSETRON HCL 4 MG PO TABS: 4.0000 mg | ORAL_TABLET | Freq: Four times a day (QID) | ORAL | Status: DC | PRN

## 2018-01-13 MED ORDER — CEFAZOLIN SODIUM-DEXTROSE 1-4 GM/50ML-% IV SOLN
1.0000 g | Freq: Four times a day (QID) | INTRAVENOUS | Status: AC
  Administered 2018-01-13 (×2): 1 g via INTRAVENOUS
  Filled 2018-01-13 (×2): qty 50

## 2018-01-13 MED ORDER — LISINOPRIL-HYDROCHLOROTHIAZIDE 20-25 MG PO TABS: 1.0000 | ORAL_TABLET | Freq: Every day | ORAL | Status: DC

## 2018-01-13 MED ORDER — CEFAZOLIN SODIUM-DEXTROSE 2-4 GM/100ML-% IV SOLN
2.0000 g | INTRAVENOUS | Status: AC
  Administered 2018-01-13: 2 g via INTRAVENOUS
  Filled 2018-01-13: qty 100

## 2018-01-13 MED ORDER — LIDOCAINE 2% (20 MG/ML) 5 ML SYRINGE
INTRAMUSCULAR | Status: AC
  Filled 2018-01-13: qty 5

## 2018-01-13 MED ORDER — EPHEDRINE 5 MG/ML INJ
INTRAVENOUS | Status: AC
  Filled 2018-01-13: qty 10

## 2018-01-13 MED ORDER — ASPIRIN 81 MG PO CHEW
81.0000 mg | CHEWABLE_TABLET | Freq: Two times a day (BID) | ORAL | Status: DC
  Administered 2018-01-13 – 2018-01-15 (×4): 81 mg via ORAL
  Filled 2018-01-13 (×4): qty 1

## 2018-01-13 MED ORDER — DEXAMETHASONE SODIUM PHOSPHATE 10 MG/ML IJ SOLN
INTRAMUSCULAR | Status: DC | PRN
  Administered 2018-01-13: 10 mg via INTRAVENOUS

## 2018-01-13 MED ORDER — POLYETHYLENE GLYCOL 3350 17 G PO PACK: 17.0000 g | PACK | Freq: Every day | ORAL | Status: DC | PRN

## 2018-01-13 MED ORDER — PHENYLEPHRINE 40 MCG/ML (10ML) SYRINGE FOR IV PUSH (FOR BLOOD PRESSURE SUPPORT)
PREFILLED_SYRINGE | INTRAVENOUS | Status: DC | PRN
  Administered 2018-01-13: 80 ug via INTRAVENOUS
  Administered 2018-01-13: 120 ug via INTRAVENOUS
  Administered 2018-01-13: 80 ug via INTRAVENOUS
  Administered 2018-01-13: 40 ug via INTRAVENOUS

## 2018-01-13 MED ORDER — PROPOFOL 500 MG/50ML IV EMUL
INTRAVENOUS | Status: DC | PRN
  Administered 2018-01-13: 85 ug/kg/min via INTRAVENOUS

## 2018-01-13 MED ORDER — ALBUMIN HUMAN 5 % IV SOLN
12.5000 g | Freq: Once | INTRAVENOUS | Status: AC
  Administered 2018-01-13: 12.5 g via INTRAVENOUS

## 2018-01-13 MED ORDER — METHOCARBAMOL 1000 MG/10ML IJ SOLN
500.0000 mg | Freq: Four times a day (QID) | INTRAVENOUS | Status: DC | PRN
  Filled 2018-01-13: qty 5

## 2018-01-13 MED ORDER — DEXAMETHASONE SODIUM PHOSPHATE 10 MG/ML IJ SOLN
INTRAMUSCULAR | Status: AC
  Filled 2018-01-13: qty 1

## 2018-01-13 MED ORDER — ONDANSETRON HCL 4 MG/2ML IJ SOLN: 4.0000 mg | Freq: Once | INTRAMUSCULAR | Status: DC | PRN

## 2018-01-13 MED ORDER — SODIUM CHLORIDE 0.9 % IV SOLN
INTRAVENOUS | Status: DC | PRN
  Administered 2018-01-13: 30 ug/min via INTRAVENOUS

## 2018-01-13 MED ORDER — FENTANYL CITRATE (PF) 250 MCG/5ML IJ SOLN
INTRAMUSCULAR | Status: AC
  Filled 2018-01-13: qty 5

## 2018-01-13 MED ORDER — ALBUMIN HUMAN 5 % IV SOLN
INTRAVENOUS | Status: AC
  Filled 2018-01-13: qty 250

## 2018-01-13 MED ORDER — LACTATED RINGERS IV SOLN
INTRAVENOUS | Status: DC | PRN
  Administered 2018-01-13 (×2): via INTRAVENOUS

## 2018-01-13 MED ORDER — ONDANSETRON HCL 4 MG/2ML IJ SOLN
INTRAMUSCULAR | Status: AC
  Filled 2018-01-13: qty 2

## 2018-01-13 MED ORDER — METHOCARBAMOL 500 MG PO TABS
500.0000 mg | ORAL_TABLET | Freq: Four times a day (QID) | ORAL | Status: DC | PRN
  Administered 2018-01-13 – 2018-01-15 (×5): 500 mg via ORAL
  Filled 2018-01-13 (×4): qty 1

## 2018-01-13 MED ORDER — ONDANSETRON HCL 4 MG/2ML IJ SOLN
INTRAMUSCULAR | Status: DC | PRN
  Administered 2018-01-13: 4 mg via INTRAVENOUS

## 2018-01-13 MED ORDER — ONDANSETRON HCL 4 MG/2ML IJ SOLN: 4.0000 mg | Freq: Four times a day (QID) | INTRAMUSCULAR | Status: DC | PRN

## 2018-01-13 MED ORDER — 0.9 % SODIUM CHLORIDE (POUR BTL) OPTIME
TOPICAL | Status: DC | PRN
  Administered 2018-01-13: 1000 mL

## 2018-01-13 SURGICAL SUPPLY — 57 items
BENZOIN TINCTURE PRP APPL 2/3 (GAUZE/BANDAGES/DRESSINGS) ×3 IMPLANT
BLADE CLIPPER SURG (BLADE) IMPLANT
BLADE SAW SGTL 18X1.27X75 (BLADE) ×2 IMPLANT
BLADE SAW SGTL 18X1.27X75MM (BLADE) ×1
CELLS DAT CNTRL 66122 CELL SVR (MISCELLANEOUS) ×1 IMPLANT
CLOSURE STERI-STRIP 1/2X4 (GAUZE/BANDAGES/DRESSINGS) ×1
CLOSURE WOUND 1/2 X4 (GAUZE/BANDAGES/DRESSINGS) ×2
CLSR STERI-STRIP ANTIMIC 1/2X4 (GAUZE/BANDAGES/DRESSINGS) ×2 IMPLANT
COVER SURGICAL LIGHT HANDLE (MISCELLANEOUS) ×3 IMPLANT
CUP SECTOR GRIPTON 50MM (Cup) ×3 IMPLANT
DRAPE C-ARM 42X72 X-RAY (DRAPES) ×3 IMPLANT
DRAPE STERI IOBAN 125X83 (DRAPES) ×3 IMPLANT
DRAPE U-SHAPE 47X51 STRL (DRAPES) ×9 IMPLANT
DRSG AQUACEL AG ADV 3.5X10 (GAUZE/BANDAGES/DRESSINGS) ×3 IMPLANT
DURAPREP 26ML APPLICATOR (WOUND CARE) ×3 IMPLANT
ELECT BLADE 4.0 EZ CLEAN MEGAD (MISCELLANEOUS) ×3
ELECT BLADE 6.5 EXT (BLADE) IMPLANT
ELECT REM PT RETURN 9FT ADLT (ELECTROSURGICAL) ×3
ELECTRODE BLDE 4.0 EZ CLN MEGD (MISCELLANEOUS) ×1 IMPLANT
ELECTRODE REM PT RTRN 9FT ADLT (ELECTROSURGICAL) ×1 IMPLANT
FACESHIELD WRAPAROUND (MASK) ×6 IMPLANT
GLOVE BIOGEL PI IND STRL 8 (GLOVE) ×2 IMPLANT
GLOVE BIOGEL PI INDICATOR 8 (GLOVE) ×4
GLOVE ECLIPSE 8.0 STRL XLNG CF (GLOVE) ×3 IMPLANT
GLOVE ORTHO TXT STRL SZ7.5 (GLOVE) ×6 IMPLANT
GOWN STRL REUS W/ TWL LRG LVL3 (GOWN DISPOSABLE) ×2 IMPLANT
GOWN STRL REUS W/ TWL XL LVL3 (GOWN DISPOSABLE) ×2 IMPLANT
GOWN STRL REUS W/TWL LRG LVL3 (GOWN DISPOSABLE) ×4
GOWN STRL REUS W/TWL XL LVL3 (GOWN DISPOSABLE) ×4
HANDPIECE INTERPULSE COAX TIP (DISPOSABLE) ×2
HEAD FEM STD 32X+9 STRL (Hips) ×3 IMPLANT
KIT BASIN OR (CUSTOM PROCEDURE TRAY) ×3 IMPLANT
KIT TURNOVER KIT B (KITS) ×3 IMPLANT
LINER ACETABULAR 32X50 (Liner) ×3 IMPLANT
MANIFOLD NEPTUNE II (INSTRUMENTS) ×3 IMPLANT
NS IRRIG 1000ML POUR BTL (IV SOLUTION) ×3 IMPLANT
PACK TOTAL JOINT (CUSTOM PROCEDURE TRAY) ×3 IMPLANT
PAD ARMBOARD 7.5X6 YLW CONV (MISCELLANEOUS) ×3 IMPLANT
RTRCTR WOUND ALEXIS 18CM MED (MISCELLANEOUS) ×3
SCREW 6.5MMX25MM (Screw) ×6 IMPLANT
SET HNDPC FAN SPRY TIP SCT (DISPOSABLE) ×1 IMPLANT
STAPLER VISISTAT 35W (STAPLE) IMPLANT
STEM CORAIL KA12 (Stem) ×3 IMPLANT
STRIP CLOSURE SKIN 1/2X4 (GAUZE/BANDAGES/DRESSINGS) ×4 IMPLANT
SUT ETHIBOND NAB CT1 #1 30IN (SUTURE) ×3 IMPLANT
SUT MNCRL AB 4-0 PS2 18 (SUTURE) IMPLANT
SUT VIC AB 0 CT1 27 (SUTURE) ×2
SUT VIC AB 0 CT1 27XBRD ANBCTR (SUTURE) ×1 IMPLANT
SUT VIC AB 1 CT1 27 (SUTURE) ×2
SUT VIC AB 1 CT1 27XBRD ANBCTR (SUTURE) ×1 IMPLANT
SUT VIC AB 2-0 CT1 27 (SUTURE) ×2
SUT VIC AB 2-0 CT1 TAPERPNT 27 (SUTURE) ×1 IMPLANT
TOWEL OR 17X24 6PK STRL BLUE (TOWEL DISPOSABLE) ×3 IMPLANT
TOWEL OR 17X26 10 PK STRL BLUE (TOWEL DISPOSABLE) ×3 IMPLANT
TRAY CATH 16FR W/PLASTIC CATH (SET/KITS/TRAYS/PACK) IMPLANT
TRAY FOLEY MTR SLVR 16FR STAT (SET/KITS/TRAYS/PACK) IMPLANT
WATER STERILE IRR 1000ML POUR (IV SOLUTION) ×6 IMPLANT

## 2018-01-13 NOTE — Anesthesia Procedure Notes (Signed)
Procedure Name: MAC Date/Time: 01/13/2018 12:15 PM Performed by: Harden Mo, CRNA Pre-anesthesia Checklist: Patient identified, Emergency Drugs available, Suction available and Patient being monitored Patient Re-evaluated:Patient Re-evaluated prior to induction Oxygen Delivery Method: Simple face mask Preoxygenation: Pre-oxygenation with 100% oxygen Induction Type: IV induction Placement Confirmation: positive ETCO2 and breath sounds checked- equal and bilateral Dental Injury: Teeth and Oropharynx as per pre-operative assessment

## 2018-01-13 NOTE — Brief Op Note (Signed)
01/13/2018  1:37 PM  PATIENT:  Terry Mcneil  77 y.o. female  PRE-OPERATIVE DIAGNOSIS:  left hip avascular necrosis  POST-OPERATIVE DIAGNOSIS:  left hip avascular necrosis  PROCEDURE:  Procedure(s): LEFT TOTAL HIP ARTHROPLASTY ANTERIOR APPROACH (Left)  SURGEON:  Surgeon(s) and Role:    Mcarthur Rossetti, MD - Primary  PHYSICIAN ASSISTANT: Benita Stabile, PA-C  ANESTHESIA:   spinal  EBL:  150 mL   COUNTS:  YES  DICTATION: .Other Dictation: Dictation Number [id number]  PLAN OF CARE: Admit to inpatient   PATIENT DISPOSITION:  PACU - hemodynamically stable.   Delay start of Pharmacological VTE agent (>24hrs) due to surgical blood loss or risk of bleeding: no

## 2018-01-13 NOTE — Anesthesia Postprocedure Evaluation (Signed)
Anesthesia Post Note  Patient: BHAVANA KADY  Procedure(s) Performed: LEFT TOTAL HIP ARTHROPLASTY ANTERIOR APPROACH (Left Hip)     Patient location during evaluation: PACU Anesthesia Type: MAC Level of consciousness: oriented and awake and alert Pain management: pain level controlled Vital Signs Assessment: post-procedure vital signs reviewed and stable Respiratory status: spontaneous breathing, respiratory function stable and patient connected to nasal cannula oxygen Cardiovascular status: blood pressure returned to baseline and stable Postop Assessment: no headache, no backache and no apparent nausea or vomiting Anesthetic complications: no    Last Vitals:  Vitals:   01/13/18 1608 01/13/18 1617  BP:    Pulse: 74   Resp: 16 18  Temp:  (!) 36.2 C  SpO2: 94%     Last Pain:  Vitals:   01/13/18 1617  TempSrc:   PainSc: 2                  Raeonna Milo COKER

## 2018-01-13 NOTE — Progress Notes (Signed)
SBP 70-74, NSR 66, pt awake & alert, no c/o. Dr Linna Caprice updated. New orders for NEO drip to keep SBP> 90, and 1 IV Albumin.

## 2018-01-13 NOTE — Evaluation (Signed)
Physical Therapy Evaluation Patient Details Name: Terry Mcneil MRN: [id number] DOB: 1940-06-04 Today's Date: 01/13/2018   History of Present Illness  Pt is a 77 y/o female s/p L THA, direct anterior. PMH includes L breast cancer s/p mastectomy, HTN, and s/p ORIF of R patella.   Clinical Impression  Pt is s/p surgery above with deficits below. Pt limited in gait distance secondary to pain. Required min to min guard A for mobility using RW. Educated about supine HEP. Will continue to follow acutely to maximize functional mobility independence and safety.     Follow Up Recommendations Follow surgeon's recommendation for DC plan and follow-up therapies;Supervision for mobility/OOB    Equipment Recommendations  3in1 (PT)    Recommendations for Other Services       Precautions / Restrictions Precautions Precautions: None Restrictions Weight Bearing Restrictions: Yes LLE Weight Bearing: Weight bearing as tolerated      Mobility  Bed Mobility Overal bed mobility: Needs Assistance Bed Mobility: Supine to Sit     Supine to sit: Min assist     General bed mobility comments: Min A for trunk elevation and to assist with scooting hips to EOB.   Transfers Overall transfer level: Needs assistance Equipment used: Rolling walker (2 wheeled) Transfers: Sit to/from Stand Sit to Stand: Min assist         General transfer comment: Min A for lift assist and steadying. Verbal cues for safe hand placement.   Ambulation/Gait Ambulation/Gait assistance: Min guard Gait Distance (Feet): 20 Feet Assistive device: Rolling walker (2 wheeled) Gait Pattern/deviations: Step-to pattern;Step-through pattern;Decreased step length - right;Decreased step length - left;Decreased weight shift to left;Antalgic Gait velocity: Decreased    General Gait Details: Slow, antalgic gait. Distance limited secondary to pain. Verbal cues for sequencing using RW. Able to progress to step through pattern by end of  gait, however, still presenting with decreased weightshift to LLE.   Stairs            Wheelchair Mobility    Modified Rankin (Stroke Patients Only)       Balance Overall balance assessment: Needs assistance Sitting-balance support: No upper extremity supported;Feet supported Sitting balance-Leahy Scale: Good     Standing balance support: Bilateral upper extremity supported;During functional activity Standing balance-Leahy Scale: Poor Standing balance comment: Reliant on BUE support                              Pertinent Vitals/Pain Pain Assessment: 0-10 Pain Score: 4  Pain Location: L hip  Pain Descriptors / Indicators: Aching;Operative site guarding Pain Intervention(s): Limited activity within patient's tolerance;Monitored during session;Repositioned    Home Living Family/patient expects to be discharged to:: Private residence Living Arrangements: Spouse/significant other Available Help at Discharge: Family;Available 24 hours/day Type of Home: House Home Access: Stairs to enter Entrance Stairs-Rails: Left Entrance Stairs-Number of Steps: 4 Home Layout: One level Home Equipment: Walker - 2 wheels      Prior Function Level of Independence: Independent with assistive device(s)         Comments: Reports using RW for ambulation      Hand Dominance   Dominant Hand: Right    Extremity/Trunk Assessment        Lower Extremity Assessment Lower Extremity Assessment: LLE deficits/detail LLE Deficits / Details: Reports some decreased sensation. Deficits consistent with post op pain and weakness. Able to perform ther ex below.     Cervical / Trunk Assessment Cervical / Trunk  Assessment: Normal  Communication   Communication: No difficulties  Cognition Arousal/Alertness: Awake/alert Behavior During Therapy: WFL for tasks assessed/performed Overall Cognitive Status: Within Functional Limits for tasks assessed                                         General Comments General comments (skin integrity, edema, etc.): Pt's husband present during session.     Exercises Total Joint Exercises Ankle Circles/Pumps: AROM;Both;20 reps Quad Sets: AROM;Left;10 reps Heel Slides: AROM;Left;10 reps   Assessment/Plan    PT Assessment Patient needs continued PT services  PT Problem List Decreased strength;Decreased range of motion;Decreased activity tolerance;Decreased balance;Decreased mobility;Decreased knowledge of use of DME;Pain;Impaired sensation       PT Treatment Interventions DME instruction;Gait training;Functional mobility training;Stair training;Therapeutic exercise;Therapeutic activities;Balance training;Patient/family education    PT Goals (Current goals can be found in the Care Plan section)  Acute Rehab PT Goals Patient Stated Goal: to go home tomorrow  PT Goal Formulation: With patient Time For Goal Achievement: 01/27/18 Potential to Achieve Goals: Good    Frequency 7X/week   Barriers to discharge        Co-evaluation               AM-PAC PT "6 Clicks" Daily Activity  Outcome Measure Difficulty turning over in bed (including adjusting bedclothes, sheets and blankets)?: A Little Difficulty moving from lying on back to sitting on the side of the bed? : Unable Difficulty sitting down on and standing up from a chair with arms (e.g., wheelchair, bedside commode, etc,.)?: Unable Help needed moving to and from a bed to chair (including a wheelchair)?: A Little Help needed walking in hospital room?: A Little Help needed climbing 3-5 steps with a railing? : A Lot 6 Click Score: 13    End of Session Equipment Utilized During Treatment: Gait belt Activity Tolerance: Patient limited by pain Patient left: in chair;with call bell/phone within reach;with family/visitor present Nurse Communication: Mobility status PT Visit Diagnosis: Other abnormalities of gait and mobility (R26.89);Pain;Muscle  weakness (generalized) (M62.81) Pain - Right/Left: Left Pain - part of body: Hip    Time: 1715-1747 PT Time Calculation (min) (ACUTE ONLY): 32 min   Charges:   PT Evaluation $PT Eval Low Complexity: 1 Low PT Treatments $Gait Training: 8-22 mins        Leighton Ruff, PT, DPT  Acute Rehabilitation Services  Pager: [phone] Office: [phone]   Rudean Hitt 01/13/2018, 6:20 PM

## 2018-01-13 NOTE — Transfer of Care (Signed)
Immediate Anesthesia Transfer of Care Note  Patient: Terry Mcneil  Procedure(s) Performed: LEFT TOTAL HIP ARTHROPLASTY ANTERIOR APPROACH (Left Hip)  Patient Location: PACU  Anesthesia Type:MAC and Spinal  Level of Consciousness: awake, alert  and oriented  Airway & Oxygen Therapy: Patient Spontanous Breathing  Post-op Assessment: Report given to RN and Post -op Vital signs reviewed and stable  Post vital signs: Reviewed and stable  Last Vitals:  Vitals Value Taken Time  BP    Temp    Pulse 72 01/13/2018  1:58 PM  Resp 14 01/13/2018  1:58 PM  SpO2 94 % 01/13/2018  1:58 PM  Vitals shown include unvalidated device data.  Last Pain:  Vitals:   01/13/18 1048  TempSrc:   PainSc: 0-No pain      Patients Stated Pain Goal: 5 (72/55/00 1642)  Complications: No apparent anesthesia complications

## 2018-01-13 NOTE — H&P (Signed)
TOTAL HIP ADMISSION H&P  Patient is admitted for left total hip arthroplasty.  Subjective:  Chief Complaint: left hip pain  HPI: Terry Mcneil, 77 y.o. female, has a history of pain and functional disability in the left hip(s) due to avascular necrosis  and patient has failed non-surgical conservative treatments for greater than 12 weeks to include NSAID's and/or analgesics, corticosteriod injections, flexibility and strengthening excercises, use of assistive devices and activity modification.  Onset of symptoms was abrupt starting 1 years ago with rapidlly worsening course since that time.The patient noted no past surgery on the left hip(s).  Patient currently rates pain in the left hip at 10 out of 10 with activity. Patient has night pain, worsening of pain with activity and weight bearing, trendelenberg gait, pain that interfers with activities of daily living and pain with passive range of motion. Patient has evidence of subchondral cysts, subchondral sclerosis, joint subluxation and joint space narrowing by imaging studies. This condition presents safety issues increasing the risk of falls.  There is no current active infection.  Patient Active Problem List   Diagnosis Date Noted  . Avascular necrosis of bone of hip, left ([place]) 01/13/2018  . Breast cancer ([place]) 03/18/2017  . Malignant neoplasm of upper-outer quadrant of left breast in female, estrogen receptor positive ([place]) 11/05/2016  . Insomnia 01/16/2016  . Lower back pain 06/10/2014  . Osteopenia 02/07/2014  . Other malaise and fatigue 04/17/2013  . Routine general medical examination at a health care facility 04/11/2013  . Need for prophylactic vaccination and inoculation against influenza 04/11/2013  . Essential hypertension, benign 10/15/2012  . Hyperlipidemia 10/15/2012  . Overactive bladder 10/15/2012   Past Medical History:  Diagnosis Date  . Arthritis    back  . Cancer ([place]) 09/2016   left breast cancer  . Cataract    . Colon polyps   . Hypertension   . Osteopenia 10/11/2009  . Patellar fracture    Right Knee   . Urge incontinence     Past Surgical History:  Procedure Laterality Date  . COLONOSCOPY    . EYE SURGERY Bilateral    Cataracts   . ORIF PATELLA Right 08/28/2012   Procedure: OPEN REDUCTION INTERNAL (ORIF) FIXATION PATELLA- RIGHT;  Surgeon: Newt Minion, MD;  Location: [facility];  Service: Orthopedics;  Laterality: Right;  Open Reduction Internal Fixation Right Patella  . RADIOACTIVE SEED GUIDED PARTIAL MASTECTOMY WITH AXILLARY SENTINEL LYMPH NODE BIOPSY Left 10/03/2016   Procedure: LEFT BREAST RADIOACTIVE SEED GUIDED PARTIAL MASTECTOMY WITH LEFT SENTINEL LYMPH NODE MAPPING;  Surgeon: Erroll Luna, MD;  Location: [facility];  Service: General;  Laterality: Left;  . TONSILLECTOMY    . TUBAL LIGATION    . VAGINAL DELIVERY     x2 -     Current Facility-Administered Medications  Medication Dose Route Frequency Provider Last Rate Last Dose  . ceFAZolin (ANCEF) IVPB 2g/100 mL premix  2 g Intravenous On Call to OR Mcarthur Rossetti, MD      . tranexamic acid (CYKLOKAPRON) 1,000 mg in sodium chloride 0.9 % 100 mL IVPB  1,000 mg Intravenous To OR Mcarthur Rossetti, MD       Facility-Administered Medications Ordered in Other Encounters  Medication Dose Route Frequency Provider Last Rate Last Dose  . lactated ringers infusion    Continuous PRN Harden Mo, CRNA       No Known Allergies  Social History   Tobacco Use  . Smoking status: Former Smoker  Last attempt to quit: 08/27/1978    Years since quitting: 39.4  . Smokeless tobacco: Never Used  Substance Use Topics  . Alcohol use: Yes    Alcohol/week: 7.0 standard drinks    Types: 7 Glasses of wine per week    Family History  Problem Relation Age of Onset  . Heart disease Father 79       valve replacement     Review of Systems  Musculoskeletal: Positive for back pain and joint pain.  All other systems  reviewed and are negative.   Objective:  Physical Exam  Constitutional: [name] oriented to person, place, and time. She appears well-developed and well-nourished.  HENT:  Head: Normocephalic and atraumatic.  Eyes: Pupils are equal, round, and reactive to light. EOM are normal.  Neck: Normal range of motion. Neck supple.  Cardiovascular: Normal rate.  Respiratory: Effort normal and breath sounds normal.  GI: Soft. Bowel sounds are normal.  Musculoskeletal:       Left hip: She exhibits decreased range of motion, decreased strength, tenderness, bony tenderness and deformity.  Neurological: [name] alert and oriented to person, place, and time.  Skin: Skin is warm and dry.  Psychiatric: She has a normal mood and affect.    Vital signs in last 24 hours: Temp:  [97.9 F (36.6 C)] 97.9 F (36.6 C) (09/03 1033) Pulse Rate:  [81] 81 (09/03 1033) Resp:  [18] 18 (09/03 1033) BP: (117)/(51) 117/51 (09/03 1033) SpO2:  [97 %] 97 % (09/03 1033) Weight:  [73.5 kg] 73.5 kg (09/03 1033)  Labs:   Estimated body mass index is 26.15 kg/m as calculated from the following:   Height as of 01/06/18: 5\' 6"  (1.676 m).   Weight as of this encounter: 73.5 kg.   Imaging Review Plain radiographs demonstrate severe degenerative joint disease of the left hip(s). The bone quality appears to be good for age and reported activity level.    Preoperative templating of the joint replacement has been completed, documented, and submitted to the Operating Room personnel in order to optimize intra-operative equipment management.     Assessment/Plan:  End avascular necrosis, left hip(s)  The patient history, physical examination, clinical judgement of the provider and imaging studies are consistent with end stage degenerative joint disease of the left hip(s) and total hip arthroplasty is deemed medically necessary. The treatment options including medical management, injection therapy, arthroscopy and  arthroplasty were discussed at length. The risks and benefits of total hip arthroplasty were presented and reviewed. The risks due to aseptic loosening, infection, stiffness, dislocation/subluxation,  thromboembolic complications and other imponderables were discussed.  The patient acknowledged the explanation, agreed to proceed with the plan and consent was signed. Patient is being admitted for inpatient treatment for surgery, pain control, PT, OT, prophylactic antibiotics, VTE prophylaxis, progressive ambulation and ADL's and discharge planning.The patient is planning to be discharged home with home health services

## 2018-01-13 NOTE — Anesthesia Preprocedure Evaluation (Addendum)
Anesthesia Evaluation  Patient identified by MRN, date of birth, ID band Patient awake    Reviewed: Allergy & Precautions, NPO status , Patient's Chart, lab work & pertinent test results  Airway Mallampati: II  TM Distance: >3 FB Neck ROM: Full    Dental  (+) Teeth Intact, Dental Advisory Given   Pulmonary former smoker,    breath sounds clear to auscultation       Cardiovascular hypertension,  Rhythm:Regular Rate:Normal     Neuro/Psych    GI/Hepatic   Endo/Other    Renal/GU      Musculoskeletal   Abdominal   Peds  Hematology   Anesthesia Other Findings   Reproductive/Obstetrics                             Anesthesia Physical Anesthesia Plan  ASA: III  Anesthesia Plan: MAC and Spinal   Post-op Pain Management:    Induction:   PONV Risk Score and Plan: Ondansetron and Dexamethasone  Airway Management Planned: Natural Airway and Simple Face Mask  Additional Equipment:   Intra-op Plan:   Post-operative Plan:   Informed Consent: I have reviewed the patients History and Physical, chart, labs and discussed the procedure including the risks, benefits and alternatives for the proposed anesthesia with the patient or authorized representative who has indicated his/her understanding and acceptance.   Dental advisory given  Plan Discussed with: CRNA and Anesthesiologist  Anesthesia Plan Comments:         Anesthesia Quick Evaluation

## 2018-01-13 NOTE — Op Note (Signed)
NAME: Terry Mcneil, Terry Mcneil MEDICAL RECORD WI:[id number] ACCOUNT [id number] DATE OF BIRTH:09/18/1940 FACILITY: [facility] LOCATION: [facility] PHYSICIAN:CHRISTOPHER Kerry Fort, MD  OPERATIVE REPORT  DATE OF PROCEDURE:  01/13/2018  PREOPERATIVE DIAGNOSIS:  Primary osteoarthritis and degenerative joint disease, left hip, with left hip subluxation.  POSTOPERATIVE DIAGNOSIS:  Primary osteoarthritis and degenerative joint disease, left hip, with left hip subluxation.  PROCEDURE:  Left total hip arthroplasty, direct anterior approach.  IMPLANTS:  DePuy Sector Gription acetabular component size 50 with 2 screws, size 32+0 neutral polyethylene liner, size 12 Corail femoral component with standard offset, size 32+9 metal hip ball.  SURGEON:  Lind Guest. Ninfa Linden, MD  ASSISTANT:  Erskine Emery, PA-C  ANESTHESIA:  Spinal.  ANTIBIOTICS:  Two grams IV Ancef.  ESTIMATED BLOOD LOSS:  150 mL.  COMPLICATIONS:  None.  INDICATIONS:  The patient is a very pleasant 77 year old female with debilitating arthritis involving her left hip.  Her x-rays actually show the left femoral head is subluxed at the joint level, and her left leg is significantly shorter than the right  side.  Her pain is daily, and at this point, it is detrimentally affecting her activities of daily living, her quality of life and her mobility.  She does have lumbar spine issues as well that are also contributing to her pain.  We went over her x-rays  in detail in the office, and at this point, we are recommending a total hip arthroplasty.  We had a long and thorough discussion about this going over x-rays.  I talked about the risks of acute blood loss anemia, nerve or vessel injury, fracture,  infection, dislocation and DVT.  She understands our goals are to decrease pain, improve mobility and overall improve quality of life.  DESCRIPTION OF PROCEDURE:  After informed consent was obtained and appropriate left hip was marked, she was brought  to the operating room and sat up on the stretcher.  Spinal anesthesia was obtained, a Foley catheter was placed, and then she was laid  back in supine position.  I could not really get a good examination of her leg length, and certainly she is definitely much shorter on her left operative side than the right.  I did let her know that we are going to try as best we could to get her leg  lengths back out, but certainly that may be somewhat difficult because she has lived in a shortened position for so long.  We tightened tissue.  I then placed her supine on the Hana fracture table, the perineal post in place and both legs in in-line  skeletal traction device and no traction applied.  Her left operative hip was prepped and draped with DuraPrep and sterile drapes.  A time-out was called, and she was identified as correct patient, correct left hip.  We then made an incision just  inferior and posterior to the anterior superior iliac spine and carried this obliquely down the leg.  We dissected down to tensor fascia lata muscle.  Tensor fascia was then divided longitudinally to proceed with direct anterior approach to the hip.  I  identified and cauterized circumflex vessels and identified the hip capsule.  I opened up the hip capsule in an L-type format finding a very large joint effusion.  We placed Cobra retractors along the medial and lateral femoral neck, and you could  absolutely see that the femoral head was subluxed at the joint.  We made our femoral neck cut proximal to the lesser trochanter and completed this  with an osteotome.  We placed a corkscrew guide in the femoral head and removed the femoral head in its  entirety and found it to be devoid of cartilage consistent with osteoarthritis and not osteonecrosis.  You could see where she had also created a false acetabulum as well.  I placed a bent Hohmann over the medial acetabular rim and then removed remnants  of the acetabular labrum.  I was able to  then begin reaming under direct visualization from a size 43 reamer in stepwise increments up to a size 49 with all reamers under direct visualization, the last reamer under direct fluoroscopy, and we were able to  securely gain our depth of reaming, our inclination and anteversion.  I then placed the real DePuy Sector Gription acetabular component size 50, and it was very tight fit, but with overhang, I still went ahead and placed 2 screws, and this had a nice  tight fit.  I then placed a 32+0 neutral polyethylene liner for that size acetabular component.  Attention was then turned to the femur.  With the leg externally rotated to 120 degrees, extended and adducted, we were able to place a Mueller retractor  medially and a Hohmann retractor behind the greater trochanter, released lateral joint capsule and used a box-cutting osteotome to enter femoral canal and a rongeur to lateralize.  I then began broaching from a size 8 broach using the Corail broaching  system going up to a size 12.  With a size 12 in place, we trialed a standard offset femoral neck and a 32+1 hip ball, reduced this in the acetabulum.  We knew we definitely needed more leg length and offset.  It was surprising it was not difficult to  reduce.  We then dislocated the hip and removed the trial components.  We were able to place the real Corail femoral component size 12 with standard offset, and then we went with a 32+9 metal hip ball.  We reduced this in the acetabulum.  We were pleased  with how tight it was.  Under radiography, we did improve her leg length and offset as well.  We then irrigated the soft tissue with normal saline solution using pulsatile lavage.  We closed the joint capsule with interrupted #1 Ethibond suture,  followed by running 0 Vicryl in tensor fascia, 0 Vicryl in the deep tissue, 2-0 Vicryl subcutaneous tissue, 4-0 Monocryl subcuticular stitch and Steri-Strips on the skin.  Aquacel dressing was applied.  She was  taken off the Hana table and taken to  recovery room in stable condition.  All final counts were correct.  There were no complications noted.  Of note, Benita Stabile, PA-C, assisted in the entire case.  His assistance was crucial for facilitating all aspects of this case.  LN/NUANCE  D:01/13/2018 T:01/13/2018 JOB:[id number]

## 2018-01-13 NOTE — Anesthesia Procedure Notes (Signed)
Spinal  Patient location during procedure: OR Start time: 01/13/2018 12:02 PM End time: 01/13/2018 12:12 PM Staffing Anesthesiologist: Roberts Gaudy, MD Performed: anesthesiologist  Spinal Block Patient position: sitting Prep: ChloraPrep Patient monitoring: heart rate, cardiac monitor, continuous pulse ox and blood pressure Approach: midline Location: L3-4 Injection technique: single-shot Needle Needle type: Pencan  Needle gauge: 24 G Needle length: 9 cm Assessment Sensory level: T6 Additional Notes 12 mg 0.75% Bupivacaine injected easily

## 2018-01-13 NOTE — Progress Notes (Signed)
Dr Linna Caprice updated. Came by to see pt. OK to tx to 5N.

## 2018-01-13 NOTE — Progress Notes (Signed)
NEO weaned & dc'd. Will continue to monitor BP. Spinal receding. Family in & updated.

## 2018-01-14 ENCOUNTER — Encounter (HOSPITAL_COMMUNITY): Payer: Self-pay | Admitting: Orthopaedic Surgery

## 2018-01-14 LAB — BASIC METABOLIC PANEL
ANION GAP: 6 (ref 5–15)
BUN: 20 mg/dL (ref 8–23)
CHLORIDE: 108 mmol/L (ref 98–111)
CO2: 24 mmol/L (ref 22–32)
Calcium: 8.7 mg/dL — ABNORMAL LOW (ref 8.9–10.3)
Creatinine, Ser: 0.81 mg/dL (ref 0.44–1.00)
GFR calc Af Amer: >60 mL/min (ref >60)
GLUCOSE: 174 mg/dL — AB (ref 70–99)
POTASSIUM: 3.5 mmol/L (ref 3.5–5.1)
SODIUM: 138 mmol/L (ref 135–145)

## 2018-01-14 LAB — CBC
HCT: 26.6 % — ABNORMAL LOW (ref 36.0–46.0)
HEMOGLOBIN: 8.8 g/dL — AB (ref 12.0–15.0)
MCH: 32.2 pg (ref 26.0–34.0)
MCHC: 33.1 g/dL (ref 30.0–36.0)
MCV: 97.4 fL (ref 78.0–100.0)
Platelets: 152 10*3/uL (ref 150–400)
RBC: 2.73 MIL/uL — AB (ref 3.87–5.11)
RDW: 12.9 % (ref 11.5–15.5)
WBC: 8.6 10*3/uL (ref 4.0–10.5)

## 2018-01-14 NOTE — Progress Notes (Signed)
Physical Therapy Treatment Patient Details Name: Terry Mcneil MRN: [id number] DOB: 22-Feb-1941 Today's Date: 01/14/2018    History of Present Illness Pt is a 77 y/o female s/p L THA, direct anterior. PMH includes L breast cancer s/p mastectomy, HTN, and s/p ORIF of R patella.     PT Comments    Pt performed gait training and functional mobility during session this am.  Pt is slow and guarded but increased in speed to finish gait quicker.  With increased speed patient becomes more unsteady.  Pt reviewed supine and seated exercises and ice applied to L hip post session.  Plan for review of standing exercises and progression of gait with emphasis on safety.  Pt's personal RW is slightly too high and she would benefit from a youth RW.     Follow Up Recommendations  Follow surgeon's recommendation for DC plan and follow-up therapies;Supervision for mobility/OOB     Equipment Recommendations  3in1 (PT)    Recommendations for Other Services       Precautions / Restrictions Precautions Precautions: None Restrictions Weight Bearing Restrictions: Yes LLE Weight Bearing: Weight bearing as tolerated    Mobility  Bed Mobility Overal bed mobility: Needs Assistance Bed Mobility: Supine to Sit     Supine to sit: Min guard     General bed mobility comments: Pt in recliner chair on arrival.    Transfers Overall transfer level: Needs assistance Equipment used: Rolling walker (2 wheeled) Transfers: Sit to/from Stand Sit to Stand: Min guard         General transfer comment: Min guard for safety, attempted to stand unassisted but unsuccessful.  Pt required cues for hand placement and forward weight shifting.    Ambulation/Gait Ambulation/Gait assistance: Min guard Gait Distance (Feet): 215 Feet Assistive device: Rolling walker (2 wheeled) Gait Pattern/deviations: Step-to pattern;Step-through pattern;Decreased step length - right;Decreased step length - left;Decreased weight shift to  left;Antalgic Gait velocity: Decreased    General Gait Details: Pt required cues for progression to step through pattern, keeping RW close, for turns and for backing.  Pt with increased cadence towards end which causes unsteadiness and one incident of catching foot on RW.     Stairs             Wheelchair Mobility    Modified Rankin (Stroke Patients Only)       Balance Overall balance assessment: Needs assistance Sitting-balance support: No upper extremity supported;Feet supported Sitting balance-Leahy Scale: Good     Standing balance support: Bilateral upper extremity supported;During functional activity;No upper extremity supported Standing balance-Leahy Scale: Fair Standing balance comment: Able to statically stand without UE support.                             Cognition Arousal/Alertness: Awake/alert Behavior During Therapy: WFL for tasks assessed/performed Overall Cognitive Status: Within Functional Limits for tasks assessed                                        Exercises Total Joint Exercises Ankle Circles/Pumps: AROM;Both;20 reps;Supine Quad Sets: AROM;Left;10 reps;Supine Short Arc Quad: AROM;Left;10 reps;Supine Heel Slides: Left;10 reps;AAROM;Supine Hip ABduction/ADduction: AAROM;Left;10 reps;Supine Long Arc Quad: AROM;Left;10 reps;Supine    General Comments        Pertinent Vitals/Pain Pain Assessment: 0-10 Pain Score: 5  Faces Pain Scale: Hurts little more Pain Location: L groin Pain  Descriptors / Indicators: Sore;Operative site guarding;Tightness Pain Intervention(s): Monitored during session;Repositioned;Ice applied    Home Living Family/patient expects to be discharged to:: Private residence Living Arrangements: Spouse/significant other Available Help at Discharge: Family;Available 24 hours/day Type of Home: House Home Access: Stairs to enter Entrance Stairs-Rails: Left Home Layout: One level Home Equipment:  Walker - 2 wheels      Prior Function Level of Independence: Independent with assistive device(s)      Comments: Has been using RW for mobility.    PT Goals (current goals can now be found in the care plan section) Acute Rehab PT Goals Patient Stated Goal: to get back her strength Potential to Achieve Goals: Good Progress towards PT goals: Progressing toward goals    Frequency    7X/week      PT Plan Current plan remains appropriate    Co-evaluation              AM-PAC PT "6 Clicks" Daily Activity  Outcome Measure  Difficulty turning over in bed (including adjusting bedclothes, sheets and blankets)?: A Little Difficulty moving from lying on back to sitting on the side of the bed? : Unable Difficulty sitting down on and standing up from a chair with arms (e.g., wheelchair, bedside commode, etc,.)?: Unable Help needed moving to and from a bed to chair (including a wheelchair)?: A Little Help needed walking in hospital room?: A Little Help needed climbing 3-5 steps with a railing? : A Lot 6 Click Score: 13    End of Session Equipment Utilized During Treatment: Gait belt Activity Tolerance: Patient limited by pain Patient left: in chair;with call bell/phone within reach;with family/visitor present Nurse Communication: Mobility status PT Visit Diagnosis: Other abnormalities of gait and mobility (R26.89);Pain;Muscle weakness (generalized) (M62.81) Pain - Right/Left: Left Pain - part of body: Hip     Time: 6712-4580 PT Time Calculation (min) (ACUTE ONLY): 17 min  Charges:  $Gait Training: 8-22 mins                     Governor Rooks, PTA pager [facility] 01/14/2018, 11:46 AM

## 2018-01-14 NOTE — Progress Notes (Signed)
Subjective: 1 Day Post-Op Procedure(s) (LRB): LEFT TOTAL HIP ARTHROPLASTY ANTERIOR APPROACH (Left) Patient reports pain as moderate.  Acute blood loss anemia from surgery - will monitor closely.  Objective: Vital signs in last 24 hours: Temp:  [97.2 F (36.2 C)-98.2 F (36.8 C)] 98.2 F (36.8 C) (09/04 0436) Pulse Rate:  [65-81] 67 (09/04 0436) Resp:  [11-20] 16 (09/04 0436) BP: (67-130)/(41-70) 94/51 (09/04 0436) SpO2:  [91 %-97 %] 95 % (09/04 0436) Weight:  [73.5 kg] 73.5 kg (09/03 2341)  Intake/Output from previous day: 09/03 0701 - 09/04 0700 In: 5454.7 [P.O.:320; I.V.:1854; IV Piggyback:2830.8] Out: 1065 [Urine:915; Blood:150] Intake/Output this shift: No intake/output data recorded.  Recent Labs    01/14/18 0455  HGB 8.8*   Recent Labs    01/14/18 0455  WBC 8.6  RBC 2.73*  HCT 26.6*  PLT 152   Recent Labs    01/14/18 0455  NA 138  K 3.5  CL 108  CO2 24  BUN 20  CREATININE 0.81  GLUCOSE 174*  CALCIUM 8.7*   No results for input(s): LABPT, INR in the last 72 hours.  Sensation intact distally Intact pulses distally Dorsiflexion/Plantar flexion intact Incision: dressing C/D/I    Assessment/Plan: 1 Day Post-Op Procedure(s) (LRB): LEFT TOTAL HIP ARTHROPLASTY ANTERIOR APPROACH (Left) Up with therapy    Mcarthur Rossetti 01/14/2018, 7:51 AM

## 2018-01-14 NOTE — Progress Notes (Signed)
Physical Therapy Treatment Patient Details Name: Terry Mcneil MRN: [id number] DOB: [birth year] Today's Date: 01/14/2018    History of Present Illness Pt is a 77 y/o female s/p L THA, direct anterior. PMH includes L breast cancer s/p mastectomy, HTN, and s/p ORIF of R patella.     PT Comments    Pt supine on arrival and reports "not feeling well', " I think I overdid it this morning".  Pt reports she needed to use the bathroom. Move BSC next to bed as patient presenting with urinary incontinence. Pt performed transfer into standing for pericare and performed short gait distance before requiring return to bed due to complaints of dizziness.  Returned to supine and BP obtained 89/51 informed RN and terminated this afternoon's tx.   Will f/u in am for progression and gait and stair training to prepare for return home.    Follow Up Recommendations  Follow surgeon's recommendation for DC plan and follow-up therapies;Supervision for mobility/OOB     Equipment Recommendations  3in1 (PT)    Recommendations for Other Services       Precautions / Restrictions Precautions Precautions: None Restrictions Weight Bearing Restrictions: Yes LLE Weight Bearing: Weight bearing as tolerated    Mobility  Bed Mobility Overal bed mobility: Needs Assistance Bed Mobility: Supine to Sit;Sit to Supine     Supine to sit: Min guard Sit to supine: Mod assist   General bed mobility comments: Pt required min guard to move to edge of bed.  Pt slow and guarded and required increased time and effort.  To return patient to bed she required moderate assistance to return to supine position including lifting of LEs.  .    Transfers Overall transfer level: Needs assistance Equipment used: Rolling walker (2 wheeled) Transfers: Sit to/from Stand Sit to Stand: Min assist         General transfer comment: Min guard for safety, cues for upper trunk control, hand placement and forward weight shifting to achieve  standing.  Pt required increased assistance in comparison to assist levels needed this am.    Ambulation/Gait Ambulation/Gait assistance: Min guard Gait Distance (Feet): (steps to BSC + 15 ft gait trial.  ) Assistive device: Rolling walker (2 wheeled) Gait Pattern/deviations: Step-through pattern;Decreased step length - right;Decreased step length - left;Decreased weight shift to left;Antalgic Gait velocity: Decreased    General Gait Details: Pt slow and guarded upon achieving distance to door became dizzy and started fading quickly, not true syncope observed, returned to supine and blood pressure 89/51.     Stairs             Wheelchair Mobility    Modified Rankin (Stroke Patients Only)       Balance Overall balance assessment: Needs assistance   Sitting balance-Leahy Scale: Good       Standing balance-Leahy Scale: Fair Standing balance comment: Able to statically stand without UE support.                             Cognition Arousal/Alertness: Awake/alert Behavior During Therapy: WFL for tasks assessed/performed Overall Cognitive Status: Within Functional Limits for tasks assessed                                        Exercises      General Comments        Pertinent  Vitals/Pain Pain Assessment: 0-10 Pain Score: 8  Pain Location: L groin Pain Descriptors / Indicators: Sore;Operative site guarding;Tightness Pain Intervention(s): Monitored during session;Repositioned    Home Living                      Prior Function            PT Goals (current goals can now be found in the care plan section) Acute Rehab PT Goals Patient Stated Goal: to get back her strength Potential to Achieve Goals: Good Progress towards PT goals: Progressing toward goals    Frequency    7X/week      PT Plan Current plan remains appropriate    Co-evaluation              AM-PAC PT "6 Clicks" Daily Activity  Outcome  Measure  Difficulty turning over in bed (including adjusting bedclothes, sheets and blankets)?: Unable Difficulty moving from lying on back to sitting on the side of the bed? : Unable Difficulty sitting down on and standing up from a chair with arms (e.g., wheelchair, bedside commode, etc,.)?: Unable Help needed moving to and from a bed to chair (including a wheelchair)?: A Little Help needed walking in hospital room?: A Little Help needed climbing 3-5 steps with a railing? : A Lot 6 Click Score: 11    End of Session Equipment Utilized During Treatment: Gait belt Activity Tolerance: Patient limited by pain Patient left: in chair;with call bell/phone within reach;with family/visitor present Nurse Communication: Mobility status PT Visit Diagnosis: Other abnormalities of gait and mobility (R26.89);Pain;Muscle weakness (generalized) (M62.81) Pain - Right/Left: Left Pain - part of body: Hip     Time: 2355-7322 PT Time Calculation (min) (ACUTE ONLY): 28 min  Charges:  $Gait Training: 8-22 mins $Therapeutic Activity: 8-22 mins                     Governor Rooks, PTA pager [phone]    Cristela Blue 01/14/2018, 4:06 PM

## 2018-01-14 NOTE — Plan of Care (Signed)

## 2018-01-14 NOTE — Evaluation (Signed)
Occupational Therapy Evaluation Patient Details Name: Terry Mcneil MRN: [id number] DOB: 05/01/1941 Today's Date: 01/14/2018    History of Present Illness Pt is a 77 y/o female s/p L THA, direct anterior. PMH includes L breast cancer s/p mastectomy, HTN, and s/p ORIF of R patella.    Clinical Impression   PTA, pt was independent with RW for ADL and functional mobility. She currently requires overall min guard assist for toilet transfers, supervision for standing oral care tasks, min assist for LB ADL, and min assist for walk-in shower transfers. Pt demonstrating her usual method for shower transfers which was unsafe and OT educated concerning safe method utilizing RW. Pt able to demonstrate understanding. She would benefit from additional OT session prior to returning home to ensure safety and improve independence with ADL and ADL transfers prior to returning home.     Follow Up Recommendations  Follow surgeon's recommendation for DC plan and follow-up therapies;Supervision/Assistance - 24 hour(initial assistance)    Equipment Recommendations  3 in 1 bedside commode    Recommendations for Other Services       Precautions / Restrictions Precautions Precautions: None Restrictions Weight Bearing Restrictions: Yes LLE Weight Bearing: Weight bearing as tolerated      Mobility Bed Mobility Overal bed mobility: Needs Assistance Bed Mobility: Supine to Sit     Supine to sit: Min guard     General bed mobility comments: Guarding assist for safety. No physical assistance required.   Transfers Overall transfer level: Needs assistance Equipment used: Rolling walker (2 wheeled) Transfers: Sit to/from Stand Sit to Stand: Min guard         General transfer comment: Min guard assist to power up to standing this session.     Balance Overall balance assessment: Needs assistance Sitting-balance support: No upper extremity supported;Feet supported Sitting balance-Leahy Scale: Good      Standing balance support: Bilateral upper extremity supported;During functional activity;No upper extremity supported Standing balance-Leahy Scale: Fair Standing balance comment: Able to statically stand without UE support.                            ADL either performed or assessed with clinical judgement   ADL Overall ADL's : Needs assistance/impaired Eating/Feeding: Set up;Sitting   Grooming: Supervision/safety;Standing;Oral care Grooming Details (indicate cue type and reason): ambulating from bed to sink to complete standing grooming tasks.  Upper Body Bathing: Set up;Sitting   Lower Body Bathing: Sit to/from stand;Minimal assistance   Upper Body Dressing : Set up;Sitting   Lower Body Dressing: Minimal assistance;Sit to/from stand   Toilet Transfer: Min guard;Ambulation;RW Toilet Transfer Details (indicate cue type and reason): simulated with sit<>stand followed by mobility in room Toileting- Clothing Manipulation and Hygiene: Minimal assistance;Sit to/from stand   Tub/ Shower Transfer: Minimal assistance;Ambulation;Shower seat;Rolling walker;Walk-in Lobbyist Details (indicate cue type and reason): Educated on safe methods as pt has been completing unsafely with RW. Will need further reinforcement.  Functional mobility during ADLs: Min guard;Rolling walker General ADL Comments: Pt educated concerning compensatory ADL strategies. She demonstrates difficulty with donning/doffing socks but reports that she will not be wearing socks for a few months until the weather is cooler. Educated on safe shower transfers with RW. Will need further education.      Vision Baseline Vision/History: Wears glasses Wears Glasses: Reading only Patient Visual Report: No change from baseline Vision Assessment?: No apparent visual deficits     Perception     Praxis  Pertinent Vitals/Pain Pain Assessment: Faces Faces Pain Scale: Hurts little more Pain  Location: L hip  Pain Descriptors / Indicators: Sore;Operative site guarding;Tightness Pain Intervention(s): Limited activity within patient's tolerance;Monitored during session;Repositioned     Hand Dominance Right   Extremity/Trunk Assessment Upper Extremity Assessment Upper Extremity Assessment: Overall WFL for tasks assessed   Lower Extremity Assessment Lower Extremity Assessment: LLE deficits/detail LLE Deficits / Details: Decreased strength and ROM as expected post-operatively.    Cervical / Trunk Assessment Cervical / Trunk Assessment: Normal   Communication Communication Communication: No difficulties   Cognition Arousal/Alertness: Awake/alert Behavior During Therapy: WFL for tasks assessed/performed Overall Cognitive Status: Within Functional Limits for tasks assessed                                     General Comments       Exercises     Shoulder Instructions      Home Living Family/patient expects to be discharged to:: Private residence Living Arrangements: Spouse/significant other Available Help at Discharge: Family;Available 24 hours/day Type of Home: House Home Access: Stairs to enter [employer] of Steps: 4 Entrance Stairs-Rails: Left Home Layout: One level     Bathroom Shower/Tub: Occupational psychologist: Handicapped height     Home Equipment: Environmental consultant - 2 wheels          Prior Functioning/Environment Level of Independence: Independent with assistive device(s)        Comments: Has been using RW for mobility.         OT Problem List: Decreased strength;Decreased range of motion;Decreased activity tolerance;Impaired balance (sitting and/or standing);Decreased knowledge of use of DME or AE;Decreased safety awareness;Decreased knowledge of precautions;Pain      OT Treatment/Interventions: Self-care/ADL training;Therapeutic exercise;Energy conservation;DME and/or AE instruction;Therapeutic  activities;Patient/family education;Balance training    OT Goals(Current goals can be found in the care plan section) Acute Rehab OT Goals Patient Stated Goal: to get back her strength OT Goal Formulation: With patient Time For Goal Achievement: 01/28/18 Potential to Achieve Goals: Good ADL Goals Pt Will Perform Lower Body Bathing: with supervision;sit to/from stand Pt Will Perform Lower Body Dressing: with supervision;sit to/from stand Pt Will Transfer to Toilet: with supervision;ambulating;regular height toilet;bedside commode(BSC over toilet) Pt Will Perform Toileting - Clothing Manipulation and hygiene: with supervision;sit to/from stand Pt Will Perform Tub/Shower Transfer: Shower transfer;with supervision;ambulating;3 in 1;shower seat;rolling walker  OT Frequency: Min 2X/week   Barriers to D/C:            Co-evaluation              AM-PAC PT "6 Clicks" Daily Activity     Outcome Measure Help from another person eating meals?: None Help from another person taking care of personal grooming?: None Help from another person toileting, which includes using toliet, bedpan, or urinal?: A Little Help from another person bathing (including washing, rinsing, drying)?: A Little Help from another person to put on and taking off regular upper body clothing?: None Help from another person to put on and taking off regular lower body clothing?: A Little 6 Click Score: 21   End of Session Equipment Utilized During Treatment: Gait belt;Rolling walker Nurse Communication: Mobility status  Activity Tolerance: Patient tolerated treatment well Patient left: in chair;with call bell/phone within reach  OT Visit Diagnosis: Other abnormalities of gait and mobility (R26.89);Pain Pain - Right/Left: Left Pain - part of body: Hip  Time: 6606-0045 OT Time Calculation (min): 20 min Charges:  OT General Charges $OT Visit: 1 Visit OT Evaluation $OT Eval Moderate Complexity: Loiza Pager [phone] Office [place] Tevis Dunavan 01/14/2018, 11:24 AM

## 2018-01-15 LAB — CBC
HCT: 25.3 % — ABNORMAL LOW (ref 36.0–46.0)
Hemoglobin: 8.3 g/dL — ABNORMAL LOW (ref 12.0–15.0)
MCH: 32 pg (ref 26.0–34.0)
MCHC: 32.8 g/dL (ref 30.0–36.0)
MCV: 97.7 fL (ref 78.0–100.0)
Platelets: 127 10*3/uL — ABNORMAL LOW (ref 150–400)
RBC: 2.59 MIL/uL — ABNORMAL LOW (ref 3.87–5.11)
RDW: 13 % (ref 11.5–15.5)
WBC: 8.4 10*3/uL (ref 4.0–10.5)

## 2018-01-15 MED ORDER — OXYCODONE-ACETAMINOPHEN 5-325 MG PO TABS: 1.0000 | ORAL_TABLET | ORAL | 0 refills | Status: DC | PRN

## 2018-01-15 MED ORDER — ASPIRIN 81 MG PO CHEW: 81.0000 mg | CHEWABLE_TABLET | Freq: Two times a day (BID) | ORAL | 0 refills | Status: DC

## 2018-01-15 MED ORDER — OXYCODONE-ACETAMINOPHEN 5-325 MG PO TABS: 1.0000 | ORAL_TABLET | ORAL | Status: DC | PRN

## 2018-01-15 MED ORDER — METHOCARBAMOL 500 MG PO TABS: 500.0000 mg | ORAL_TABLET | Freq: Four times a day (QID) | ORAL | 1 refills | Status: DC | PRN

## 2018-01-15 NOTE — Progress Notes (Signed)
Pt reports feeling much better than previous day. Reviewed compensatory strategies and use of reacher and long handled bath sponge for LB ADL. Pt able to recall shower transfer technique from last session. Pt will have assist of her husband at home as needed.    01/15/18 1104  OT Visit Information  Last OT Received On 01/15/18  Assistance Needed +1  History of Present Illness Pt is a 77 y/o female s/p L THA, direct anterior. PMH includes L breast cancer s/p mastectomy, HTN, and s/p ORIF of R patella.   Precautions  Precautions Fall  Pain Assessment  Pain Assessment Faces  Faces Pain Scale 4  Pain Location L groin  Pain Descriptors / Indicators Sore;Operative site guarding;Tightness  Pain Intervention(s) Monitored during session;Premedicated before session;Ice applied  Cognition  Arousal/Alertness Awake/alert  Behavior During Therapy WFL for tasks assessed/performed  Overall Cognitive Status Within Functional Limits for tasks assessed  ADL  Overall ADL's  Needs assistance/impaired  Grooming Wash/dry hands;Standing;Supervision/safety  Lower Body Bathing Details (indicate cue type and reason) recommended long handled bath sponge  Lower Body Dressing Minimal assistance;Sit to/from stand  Lower Body Dressing Details (indicate cue type and reason) educated in use of reacher for LB dressing and to dry L foot, instructed to dress L LE first, educated in safe footwear  Toilet Transfer Ambulation;RW;Min guard  Toileting- Clothing Manipulation and Hygiene Sit to/from stand;Minimal assistance  Tub/Shower Transfer Details (indicate cue type and reason) pt able to verbalize technique for shower transfer  Functional mobility during ADLs Rolling walker;Min guard  General ADL Comments instructed in how to transport items safely with RW  Bed Mobility  General bed mobility comments pt in chair  Balance  Sitting balance-Leahy Scale Good  Standing balance-Leahy Scale Fair  Standing balance comment Able  to statically stand without UE support.   Restrictions  LLE Weight Bearing WBAT  Vision- Assessment  Vision Assessment? No apparent visual deficits  Transfers  Overall transfer level Needs assistance  Equipment used Rolling walker (2 wheeled)  Transfers Sit to/from Stand  Sit to Stand Min guard;Min assist  General transfer comment cues for technique/hand placement, assist to shift weight anterior, increased time  OT - End of Session  Equipment Utilized During Treatment Gait belt;Rolling walker  Activity Tolerance Patient tolerated treatment well  Patient left in chair;with call bell/phone within reach;with family/visitor present  OT Assessment/Plan  OT Plan Discharge plan remains appropriate  OT Visit Diagnosis Other abnormalities of gait and mobility (R26.89);Pain  Pain - Right/Left Left  Pain - part of body Hip  OT Frequency (ACUTE ONLY) Min 2X/week  Follow Up Recommendations Follow surgeon's recommendation for DC plan and follow-up therapies;Supervision/Assistance - 24 hour  OT Equipment None recommended by OT  AM-PAC OT "6 Clicks" Daily Activity Outcome Measure  Help from another person eating meals? 4  Help from another person taking care of personal grooming? 3  Help from another person toileting, which includes using toliet, bedpan, or urinal? 3  Help from another person bathing (including washing, rinsing, drying)? 3  Help from another person to put on and taking off regular upper body clothing? 4  Help from another person to put on and taking off regular lower body clothing? 3  6 Click Score 20  ADL G Code Conversion CJ  OT Goal Progression  Progress towards OT goals Progressing toward goals  Acute Rehab OT Goals  Patient Stated Goal to get back her strength  OT Goal Formulation With patient  Time For Goal Achievement  01/28/18  Potential to Achieve Goals Good  OT Time Calculation  OT Start Time (ACUTE ONLY) 1041  OT Stop Time (ACUTE ONLY) 1101  OT Time Calculation  (min) 20 min  OT General Charges  $OT Visit 1 Visit  OT Treatments  $Self Care/Home Management  8-22 mins  01/15/2018 Nestor Lewandowsky, OTR/L Pager: [phone]

## 2018-01-15 NOTE — Discharge Instructions (Signed)

## 2018-01-15 NOTE — Plan of Care (Signed)

## 2018-01-15 NOTE — Progress Notes (Signed)
Physical Therapy Treatment Patient Details Name: Terry Mcneil MRN: [id number] DOB: 1941-04-10 Today's Date: 01/15/2018    History of Present Illness Pt is a 77 y/o female s/p L THA, direct anterior. PMH includes L breast cancer s/p mastectomy, HTN, and s/p ORIF of R patella.     PT Comments    Pt supine on arrival and reports need to toilet before gait training.  Pt requiring min guard overal during session and progressing well with no signs of dizziness.  Plan next session for progression to stair training in prep for d/c home.  Rn reports d/c likely Friday.     Follow Up Recommendations  Follow surgeon's recommendation for DC plan and follow-up therapies;Supervision for mobility/OOB     Equipment Recommendations  3in1 (PT)    Recommendations for Other Services       Precautions / Restrictions Precautions Precautions: None Restrictions Weight Bearing Restrictions: Yes LLE Weight Bearing: Weight bearing as tolerated    Mobility  Bed Mobility Overal bed mobility: Needs Assistance Bed Mobility: Supine to Sit     Supine to sit: Min guard     General bed mobility comments: Pt required cues for strategy to improve ease of movement.  Denies dizziness this session upon sitting.    Transfers Overall transfer level: Needs assistance Equipment used: Rolling walker (2 wheeled) Transfers: Sit to/from Stand Sit to Stand: Min assist         General transfer comment: Cues for hand placement to and from seated surface, also required cues for forward weight shifting into standing.    Ambulation/Gait Ambulation/Gait assistance: Min guard Gait Distance (Feet): 200 Feet Assistive device: Rolling walker (2 wheeled) Gait Pattern/deviations: Step-through pattern;Decreased step length - right;Decreased step length - left;Decreased weight shift to left;Antalgic Gait velocity: Decreased    General Gait Details: Pt with improved gait this am, no signs of dizziness and safe use of RW.   Better pacing this time.     Stairs             Wheelchair Mobility    Modified Rankin (Stroke Patients Only)       Balance Overall balance assessment: Needs assistance   Sitting balance-Leahy Scale: Good       Standing balance-Leahy Scale: Fair Standing balance comment: Able to statically stand without UE support.                             Cognition Arousal/Alertness: Awake/alert Behavior During Therapy: WFL for tasks assessed/performed Overall Cognitive Status: Within Functional Limits for tasks assessed                                        Exercises Total Joint Exercises Hip ABduction/ADduction: Left;10 reps;Standing;AROM Knee Flexion: AROM;Left;10 reps;Standing Marching in Standing: AROM;Left;10 reps;Standing Standing Hip Extension: AROM;Left;10 reps;Standing    General Comments        Pertinent Vitals/Pain Pain Assessment: 0-10 Pain Score: 4  Pain Location: L groin Pain Descriptors / Indicators: Sore;Operative site guarding;Tightness Pain Intervention(s): Monitored during session;Repositioned    Home Living                      Prior Function            PT Goals (current goals can now be found in the care plan section) Acute Rehab PT Goals Patient  Stated Goal: to get back her strength Potential to Achieve Goals: Good Progress towards PT goals: Progressing toward goals    Frequency    7X/week      PT Plan Current plan remains appropriate    Co-evaluation              AM-PAC PT "6 Clicks" Daily Activity  Outcome Measure  Difficulty turning over in bed (including adjusting bedclothes, sheets and blankets)?: Unable Difficulty moving from lying on back to sitting on the side of the bed? : Unable Difficulty sitting down on and standing up from a chair with arms (e.g., wheelchair, bedside commode, etc,.)?: Unable Help needed moving to and from a bed to chair (including a wheelchair)?: A  Little Help needed walking in hospital room?: A Little Help needed climbing 3-5 steps with a railing? : A Lot 6 Click Score: 11    End of Session Equipment Utilized During Treatment: Gait belt Activity Tolerance: Patient limited by pain Patient left: in chair;with call bell/phone within reach;with family/visitor present Nurse Communication: Mobility status PT Visit Diagnosis: Other abnormalities of gait and mobility (R26.89);Pain;Muscle weakness (generalized) (M62.81) Pain - Right/Left: Left Pain - part of body: Hip     Time: 1833-5825 PT Time Calculation (min) (ACUTE ONLY): 23 min  Charges:  $Gait Training: 8-22 mins $Therapeutic Exercise: 8-22 mins                     Governor Rooks, PTA pager [phone]    Terry Mcneil 01/15/2018, 10:42 AM

## 2018-01-15 NOTE — Progress Notes (Signed)
Physical Therapy Treatment Patient Details Name: Terry Mcneil MRN: [id number] DOB: [birth year] Today's Date: 01/15/2018    History of Present Illness Pt is a 77 y/o female s/p L THA, direct anterior. PMH includes L breast cancer s/p mastectomy, HTN, and s/p ORIF of R patella.     PT Comments    Pt performed gait training, stair training and reviewed supine exercises.  Pt slow and guarded during session but eager to return home.  Pt tolerated stair training well. PTA assisted patient with lower body dressing this pm and informed RN that patient is ready to d/c home.   Follow Up Recommendations  Follow surgeon's recommendation for DC plan and follow-up therapies;Supervision for mobility/OOB     Equipment Recommendations  3in1 (PT)    Recommendations for Other Services       Precautions / Restrictions Precautions Precautions: Fall Restrictions Weight Bearing Restrictions: Yes LLE Weight Bearing: Weight bearing as tolerated    Mobility  Bed Mobility Overal bed mobility: Needs Assistance Bed Mobility: Supine to Sit;Sit to Supine     Supine to sit: Supervision Sit to supine: Supervision   General bed mobility comments: Unconventional method with segmental rolling but effective to return to bed without assistance.    Transfers Overall transfer level: Needs assistance Equipment used: Rolling walker (2 wheeled) Transfers: Sit to/from Stand Sit to Stand: Supervision         General transfer comment: Cues for hand placement as patient forgets to reach back to seated surface.  Ambulation/Gait Ambulation/Gait assistance: Supervision Gait Distance (Feet): 200 Feet Assistive device: Rolling walker (2 wheeled) Gait Pattern/deviations: Step-through pattern;Decreased step length - right;Decreased step length - left;Decreased weight shift to left;Antalgic     General Gait Details: Pt with improved gait this am, no signs of dizziness and safe use of RW.  Better pacing this time.      Stairs Stairs: Yes Stairs assistance: Min assist Stair Management: Forwards;One rail Left Number of Stairs: 4 General stair comments: Cues for sequencing and hand placement, husband present to observe technique and educated to help patient placed device at top of the stais.     Wheelchair Mobility    Modified Rankin (Stroke Patients Only)       Balance     Sitting balance-Leahy Scale: Good       Standing balance-Leahy Scale: Fair                              Cognition Arousal/Alertness: Awake/alert Behavior During Therapy: WFL for tasks assessed/performed Overall Cognitive Status: Within Functional Limits for tasks assessed                                        Exercises Total Joint Exercises Ankle Circles/Pumps: AROM;Both;20 reps;Supine Short Arc Quad: AROM;Left;10 reps;Supine Heel Slides: Left;10 reps;Supine;AROM Hip ABduction/ADduction: Left;10 reps;AROM;Supine    General Comments        Pertinent Vitals/Pain Pain Assessment: Faces Faces Pain Scale: Hurts little more Pain Location: L groin Pain Descriptors / Indicators: Sore;Operative site guarding;Tightness Pain Intervention(s): Monitored during session;Repositioned    Home Living                      Prior Function            PT Goals (current goals can now be found in the care  plan section) Acute Rehab PT Goals Patient Stated Goal: to get back her strength Potential to Achieve Goals: Good Progress towards PT goals: Progressing toward goals    Frequency    7X/week      PT Plan Current plan remains appropriate    Co-evaluation              AM-PAC PT "6 Clicks" Daily Activity  Outcome Measure  Difficulty turning over in bed (including adjusting bedclothes, sheets and blankets)?: Unable Difficulty moving from lying on back to sitting on the side of the bed? : Unable Difficulty sitting down on and standing up from a chair with arms  (e.g., wheelchair, bedside commode, etc,.)?: Unable Help needed moving to and from a bed to chair (including a wheelchair)?: A Little Help needed walking in hospital room?: A Little Help needed climbing 3-5 steps with a railing? : A Lot 6 Click Score: 11    End of Session Equipment Utilized During Treatment: Gait belt Activity Tolerance: Patient limited by pain Patient left: in chair;with call bell/phone within reach;with family/visitor present Nurse Communication: Mobility status PT Visit Diagnosis: Other abnormalities of gait and mobility (R26.89);Pain;Muscle weakness (generalized) (M62.81) Pain - Right/Left: Left Pain - part of body: Hip     Time: 1437-1500 PT Time Calculation (min) (ACUTE ONLY): 23 min  Charges:  $Gait Training: 8-22 mins $Therapeutic Exercise: 8-22 mins                     Terry Mcneil, PTA Acute Rehabilitation Services Pager [phone] Office [phone]     Terry Mcneil 01/15/2018, 3:19 PM

## 2018-01-15 NOTE — Progress Notes (Signed)
Provided discharge education/instructions, all questions and concerns addressed, Pt not in distress, discharged home with belongings accompanied by husband. 

## 2018-01-15 NOTE — Discharge Summary (Signed)
Patient ID: Terry Mcneil MRN: [id number] DOB/AGE: 1940-10-05 77 y.o.  Admit date: 01/13/2018 Discharge date: 01/15/2018  Admission Diagnoses:  Principal Problem:   Avascular necrosis of bone of hip, left ([place]) Active Problems:   Status post total replacement of left hip   Discharge Diagnoses:  Status post left total hip arthroplasty Asymptomatic Acute blood loss anemia   Past Medical History:  Diagnosis Date  . Arthritis    back  . Cancer ([place]) 09/2016   left breast cancer  . Cataract   . Colon polyps   . Hypertension   . Osteopenia 10/11/2009  . Patellar fracture    Right Knee   . Urge incontinence     Surgeries: Procedure(s): LEFT TOTAL HIP ARTHROPLASTY ANTERIOR APPROACH on 01/13/2018   Consultants:   Discharged Condition: Improved  Hospital Course: Terry Mcneil is an 77 y.o. female who was admitted 01/13/2018 for operative treatment ofAvascular necrosis of bone of hip, left ([place]). Patient has severe unremitting pain that affects sleep, daily activities, and work/hobbies. After pre-op clearance the patient was taken to the operating room on 01/13/2018 and underwent  Procedure(s): LEFT TOTAL HIP ARTHROPLASTY ANTERIOR APPROACH.    Patient was given perioperative antibiotics:  Anti-infectives (From admission, onward)   Start     Dose/Rate Route Frequency Ordered Stop   01/13/18 1700  ceFAZolin (ANCEF) IVPB 1 g/50 mL premix     1 g 100 mL/hr over 30 Minutes Intravenous Every 6 hours 01/13/18 1650 01/13/18 2208   01/13/18 1145  ceFAZolin (ANCEF) IVPB 2g/100 mL premix     2 g 200 mL/hr over 30 Minutes Intravenous On call to O.R. 01/13/18 0908 01/13/18 1225       Patient was given sequential compression devices, early ambulation, and chemoprophylaxis to prevent DVT.  Patient benefited maximally from hospital stay and there were no complications.    Recent vital signs:  Patient Vitals for the past 24 hrs:  BP Temp Temp src Pulse Resp SpO2  01/15/18 0418 108/60 97.9 F  (36.6 C) Oral 93 15 97 %  01/14/18 2121 134/78 98 F (36.7 C) Oral 90 16 100 %  01/14/18 1636 (!) 87/44 98.3 F (36.8 C) Oral 72 16 96 %  01/14/18 1558 (!) 84/37 98.9 F (37.2 C) Oral 72 15 99 %     Recent laboratory studies:  Recent Labs    01/14/18 0455 01/15/18 0417  WBC 8.6 8.4  HGB 8.8* 8.3*  HCT 26.6* 25.3*  PLT 152 127*  NA 138  --   K 3.5  --   CL 108  --   CO2 24  --   BUN 20  --   CREATININE 0.81  --   GLUCOSE 174*  --   CALCIUM 8.7*  --      Discharge Medications:   Allergies as of 01/15/2018   No Known Allergies     Medication List    TAKE these medications   aspirin 81 MG chewable tablet Chew 1 tablet (81 mg total) by mouth 2 (two) times daily.   atorvastatin 20 MG tablet Commonly known as:  LIPITOR TAKE (1/2) TABLET BY MOUTH DAILY   calcium-vitamin D 500-200 MG-UNIT tablet Commonly known as:  OSCAL WITH D Take 1 tablet by mouth daily.   carvedilol 6.25 MG tablet Commonly known as:  COREG Take 1/2 tablet twice a day.   lisinopril-hydrochlorothiazide 20-25 MG tablet Commonly known as:  PRINZIDE,ZESTORETIC Take 1 tablet by mouth daily.   methocarbamol 500 MG  tablet Commonly known as:  ROBAXIN Take 1 tablet (500 mg total) by mouth every 6 (six) hours as needed for muscle spasms.   oxyCODONE-acetaminophen 5-325 MG tablet Commonly known as:  PERCOCET/ROXICET Take 1-2 tablets by mouth every 4 (four) hours as needed for moderate pain.   tamoxifen 20 MG tablet Commonly known as:  NOLVADEX Take 20 mg by mouth daily.   tolterodine 4 MG 24 hr capsule Commonly known as:  DETROL LA TAKE 1 CAPSULE (4 MG TOTAL) BY MOUTH DAILY.   traZODone 50 MG tablet Commonly known as:  DESYREL Take 50 mg by mouth at bedtime.            Durable Medical Equipment  (From admission, onward)         Start     Ordered   01/13/18 1651  DME 3 n 1  Once     01/13/18 1650   01/13/18 1651  DME Walker rolling  Once    Question:  Patient needs a walker to  treat with the following condition  Answer:  Status post total replacement of left hip   01/13/18 1650          Diagnostic Studies: Dg Pelvis Portable  Result Date: 01/13/2018 CLINICAL DATA:  Status post left total hip replacement EXAM: PORTABLE PELVIS 1-2 VIEWS COMPARISON:  Pelvis on left hip films of 12/11/2017 FINDINGS: The femoral and acetabular components of the left total hip replacement are in good position on the images obtained. No complicating features are seen. A small amount air is noted in the soft tissues postoperatively. The pelvic rami are intact in the right hip is normally position. IMPRESSION: Left total hip replacement components in good position. No complicating features. Electronically Signed   By: Ivar Drape M.D.   On: 01/13/2018 15:09   Dg C-arm 1-60 Min  Result Date: 01/13/2018 CLINICAL DATA:  Left anterior hip replacement EXAM: DG C-ARM 61-120 MIN COMPARISON:  Pelvis and left hip films of 12/31/2017 FINDINGS: Four C arm spot films were returned. Initial films show significant degenerative change of the left hip. The third and fourth films show a left total hip replacement with the components in good position. No complicating features are seen. IMPRESSION: Left total hip replacement components in good position. No complicating features. Electronically Signed   By: Ivar Drape M.D.   On: 01/13/2018 13:36   Dg Hip Operative Unilat W Or W/o Pelvis Left  Result Date: 01/13/2018 CLINICAL DATA:  Left total hip replacement EXAM: OPERATIVE left HIP (WITH PELVIS IF PERFORMED) 4 VIEWS TECHNIQUE: Fluoroscopic spot image(s) were submitted for interpretation post-operatively. COMPARISON:  Pelvis film of 12/11/2017 FINDINGS: Four C arm spot films were returned. These show left anterior hip replacement with the components in good position. No complicating features are seen. Fluoroscopy time of 26 seconds was recorded. IMPRESSION: Left total hip replacement components in good position. No  complicating features. Electronically Signed   By: Ivar Drape M.D.   On: 01/13/2018 13:37    Disposition:     Follow-up Information    [facility] up.   Specialty:  [place] Why:  A representative from [facility] will contact you to arrange start date and time for your therapy. Contact information: [address] [place] [address] [phone]        Mcarthur Rossetti, MD. Schedule an appointment as soon as possible for a visit in 2 week(s).   Specialty:  Orthopedic Surgery  Contact information: [place] [address] [phone]            Signed: Erskine Emery 01/15/2018, 11:30 AM

## 2018-01-15 NOTE — Care Management Note (Signed)
Case Management Note  Patient Details  Name: Terry Mcneil MRN: [id number] Date of Birth: March 16, 1941  Subjective/Objective:  77 yr old female  S/p left total hip arthroplasty.                 Action/Plan: Patient was preoperatively setup with [facility] changes.  Patient has RW  at home, will have family support at discharge.   Expected Discharge Date:  01/16/18          Expected Discharge Plan:  [place]  In-House Referral:  NA  Discharge planning Services  CM Consult  Post Acute Care Choice:  Home Health, Durable Medical Equipment Choice offered to:  Patient  DME Arranged:  3-N-1(has RW) DME Agency:  [place]:  PT [place] Agency:  [facility] ([facility])  Status of Service:  Completed, signed off  If discussed at [employer] of Stay Meetings, dates discussed:    Additional Comments:  Ninfa Meeker, RN 01/15/2018, 12:14 PM

## 2018-01-15 NOTE — Progress Notes (Signed)
Subjective: 2 Days Post-Op Procedure(s) (LRB): LEFT TOTAL HIP ARTHROPLASTY ANTERIOR APPROACH (Left) Patient reports pain as moderate.  Denies chest pain, SOB, dizziness. Wanting to go home today.   Objective: Vital signs in last 24 hours: Temp:  [97.9 F (36.6 C)-98.9 F (37.2 C)] 97.9 F (36.6 C) (09/05 0418) Pulse Rate:  [72-93] 93 (09/05 0418) Resp:  [15-16] 15 (09/05 0418) BP: (84-134)/(37-78) 108/60 (09/05 0418) SpO2:  [96 %-100 %] 97 % (09/05 0418)  Intake/Output from previous day: 09/04 0701 - 09/05 0700 In: 1750.8 [P.O.:1200; I.V.:550.8] Out: 1100 [Urine:1100] Intake/Output this shift: Total I/O In: 240 [P.O.:240] Out: -   Recent Labs    01/14/18 0455 01/15/18 0417  HGB 8.8* 8.3*   Recent Labs    01/14/18 0455 01/15/18 0417  WBC 8.6 8.4  RBC 2.73* 2.59*  HCT 26.6* 25.3*  PLT 152 127*   Recent Labs    01/14/18 0455  NA 138  K 3.5  CL 108  CO2 24  BUN 20  CREATININE 0.81  GLUCOSE 174*  CALCIUM 8.7*   No results for input(s): LABPT, INR in the last 72 hours.  Left lower extremity:  Neurovascular intact Intact pulses distally Dorsiflexion/Plantar flexion intact Incision: dressing C/D/I Compartment soft    Assessment/Plan: 2 Days Post-Op Procedure(s) (LRB): LEFT TOTAL HIP ARTHROPLASTY ANTERIOR APPROACH (Left)  Work with PT for stairs if does well with PT then will discharge to home.  Discharge home with home health    Erskine Emery 01/15/2018, 11:19 AM

## 2018-01-16 ENCOUNTER — Telehealth: Payer: Self-pay

## 2018-01-16 ENCOUNTER — Telehealth (INDEPENDENT_AMBULATORY_CARE_PROVIDER_SITE_OTHER): Payer: Self-pay

## 2018-01-16 NOTE — Telephone Encounter (Signed)
Noted  

## 2018-01-16 NOTE — Telephone Encounter (Signed)
Colin from [facility] called and would like verbal orders for HHPT for twice a wk for 2 weeks. ----(Juluis Rainier I approved orders already.)    She would like a clarification on dressing??  CB [phone]

## 2018-01-16 NOTE — Telephone Encounter (Signed)
Called patient to do Hospital Follow up appointment. States she does not need one at this time. Has appointment scheduled with Orthopedics and does not need one scheduled with PCP at this time.

## 2018-01-19 NOTE — Telephone Encounter (Signed)
IC LM for her advising ok for orders but to call back to clarify what she was needing to know about dressing. Typically should stay intact until first post op visit unless having other complications.

## 2018-01-19 NOTE — Telephone Encounter (Signed)
Please advise on wound care for patient. Thanks.

## 2018-01-19 NOTE — Telephone Encounter (Signed)
That will be fine for that order. Thanks

## 2018-01-26 ENCOUNTER — Ambulatory Visit (INDEPENDENT_AMBULATORY_CARE_PROVIDER_SITE_OTHER): Payer: Medicare Other | Admitting: Orthopaedic Surgery

## 2018-01-26 ENCOUNTER — Encounter (INDEPENDENT_AMBULATORY_CARE_PROVIDER_SITE_OTHER): Payer: Self-pay | Admitting: Orthopaedic Surgery

## 2018-01-26 DIAGNOSIS — Z96642 Presence of left artificial hip joint: Secondary | ICD-10-CM

## 2018-01-26 NOTE — Progress Notes (Signed)
The patient is 2 weeks tomorrow status post a left total hip arthroplasty.  [name] very active 77 years old that she is doing well.  [name] using a walker but says her home therapist is no transition to her to a cane soon.  [name] back to her once daily baby aspirin.  [name] off pain medications and a muscle relaxant.  On exam her incision looks really good.  I remove the old Steri-Strips in place new wounds.  There was a moderate cerumen I drained about 40 cc of her incision area but there is no evidence of infection at all.  Her leg lengths are equal.  At this point she will continue increase her activities as she is comfortable.  We will see her back in 4 weeks to see how she is doing overall but no x-rays are needed.

## 2018-02-23 ENCOUNTER — Ambulatory Visit (INDEPENDENT_AMBULATORY_CARE_PROVIDER_SITE_OTHER): Payer: Medicare Other | Admitting: Orthopaedic Surgery

## 2018-02-23 ENCOUNTER — Encounter (INDEPENDENT_AMBULATORY_CARE_PROVIDER_SITE_OTHER): Payer: Self-pay | Admitting: Orthopaedic Surgery

## 2018-02-23 DIAGNOSIS — Z96642 Presence of left artificial hip joint: Secondary | ICD-10-CM

## 2018-02-23 NOTE — Progress Notes (Signed)
Patient is a 77 year old who is 6 weeks status post a left total hip arthroplasty.  [name] doing well overall.  She says the only thing she needs his confidence.  [name] not using any assistive device today.  She was on a walker for long period of time.  On exam she is moving her hip well with good internal and external rotation no pain at all.  This is her left side.  She has good strength overall.  This point we do not need to see her back for 6 months.  At that visit I would like a standing low AP pelvis and lateral of her left operative hip.

## 2018-03-16 ENCOUNTER — Encounter: Payer: Self-pay | Admitting: Family

## 2018-03-16 ENCOUNTER — Ambulatory Visit: Payer: Medicare Other | Admitting: Family

## 2018-03-16 VITALS — BP 115/51 | HR 77 | Temp 97.8°F | Resp 18 | Ht 65.0 in | Wt 148.0 lb

## 2018-03-16 DIAGNOSIS — N3281 Overactive bladder: Secondary | ICD-10-CM | POA: Diagnosis not present

## 2018-03-16 DIAGNOSIS — R739 Hyperglycemia, unspecified: Secondary | ICD-10-CM

## 2018-03-16 DIAGNOSIS — F329 Major depressive disorder, single episode, unspecified: Secondary | ICD-10-CM | POA: Diagnosis not present

## 2018-03-16 DIAGNOSIS — E785 Hyperlipidemia, unspecified: Secondary | ICD-10-CM | POA: Diagnosis not present

## 2018-03-16 DIAGNOSIS — I10 Essential (primary) hypertension: Secondary | ICD-10-CM

## 2018-03-16 DIAGNOSIS — F32A Depression, unspecified: Secondary | ICD-10-CM

## 2018-03-16 DIAGNOSIS — Z23 Encounter for immunization: Secondary | ICD-10-CM | POA: Diagnosis not present

## 2018-03-16 DIAGNOSIS — M8580 Other specified disorders of bone density and structure, unspecified site: Secondary | ICD-10-CM

## 2018-03-16 LAB — HEMOGLOBIN A1C: HEMOGLOBIN A1C: 5.1 % (ref 4.6–6.5)

## 2018-03-16 MED ORDER — ZOLEDRONIC ACID 5 MG/100ML IV SOLN: 4.0000 mg | INTRAVENOUS | 0 refills | Status: DC

## 2018-03-16 NOTE — Progress Notes (Signed)
Subjective:    Patient ID: Terry Mcneil, female    DOB: [birth year], 77 y.o.   MRN: [id number]  HPI  Patient is a 77 yr old female who presents today for routine follow up.  1) HTN- maintained on coreg, lisinopril/hctz BP Readings from Last 3 Encounters:  03/16/18 (!) 115/51  01/15/18 (!) 94/58  01/06/18 131/64   2) Hyperlipidemia- maintained on atorvastatin.  Lab Results  Component Value Date   CHOL 135 09/15/2017   HDL 54.60 09/15/2017   LDLCALC 56 09/15/2017   TRIG 119.0 09/15/2017   CHOLHDL 2 09/15/2017   3) Insomnia- stable without need for trazodone.   4) OAB- maintained on detrol LA.  Reports symptoms are stable   5) Osteopenia- continues caltrate.  Reports that she is receiving Reclast from her oncologist Q6 months.   6) Depression- Reports improvement in her mood since her pain is improved.    Reports that she had THA in September and reports significant improvement in her pain.   Lab Results  Component Value Date   HGBA1C 5.5 03/14/2015    Review of Systems    see HPI  Past Medical History:  Diagnosis Date  . Arthritis    back  . Cancer ([place]) 09/2016   left breast cancer  . Cataract   . Colon polyps   . Hypertension   . Osteopenia 10/11/2009  . Patellar fracture    Right Knee   . Urge incontinence      Social History   Socioeconomic History  . Marital status: Married    Spouse name: Layci Stenglein  . Number of children: 2  . Years of education: Not on file  . Highest education level: Not on file  Occupational History  . Occupation: Retired  Scientific laboratory technician  . Financial resource strain: Not on file  . Food insecurity:    Worry: Not on file    Inability: Not on file  . Transportation needs:    Medical: Not on file    Non-medical: Not on file  Tobacco Use  . Smoking status: Former Smoker    Last attempt to quit: 08/27/1978    Years since quitting: 39.5  . Smokeless tobacco: Never Used  Substance and Sexual Activity  . Alcohol use: Yes      Alcohol/week: 7.0 standard drinks    Types: 7 Glasses of wine per week  . Drug use: No  . Sexual activity: Yes  Lifestyle  . Physical activity:    Days per week: Not on file    Minutes per session: Not on file  . Stress: Not on file  Relationships  . Social connections:    Talks on phone: Not on file    Gets together: Not on file    Attends religious service: Not on file    Active member of club or organization: Not on file    Attends meetings of clubs or organizations: Not on file    Relationship status: Not on file  . Intimate partner violence:    Fear of current or ex partner: Not on file    Emotionally abused: Not on file    Physically abused: Not on file    Forced sexual activity: Not on file  Other Topics Concern  . Not on file  Social History Narrative   Marital Status:  Married ([name])   Children:  Sons ([name], Event organiser)    Living Situation: Lives with spouse   Occupation: Retired Secretary/administrator)  Education: [employer], some college   Tobacco Use/Exposure:  She smoked occasionally over the years but has not smoked in over 20 years.     Alcohol Use:  Moderate (Wine), 2 glasses 3 times a week   Drug Use:  None   Diet:  Weight Watchers    Exercise:  Not regular   Hobbies: Reading , Gardening , Grandchildren                Past Surgical History:  Procedure Laterality Date  . COLONOSCOPY    . EYE SURGERY Bilateral    Cataracts   . ORIF PATELLA Right 08/28/2012   Procedure: OPEN REDUCTION INTERNAL (ORIF) FIXATION PATELLA- RIGHT;  Surgeon: Newt Minion, MD;  Location: [facility];  Service: Orthopedics;  Laterality: Right;  Open Reduction Internal Fixation Right Patella  . RADIOACTIVE SEED GUIDED PARTIAL MASTECTOMY WITH AXILLARY SENTINEL LYMPH NODE BIOPSY Left 10/03/2016   Procedure: LEFT BREAST RADIOACTIVE SEED GUIDED PARTIAL MASTECTOMY WITH LEFT SENTINEL LYMPH NODE MAPPING;  Surgeon: Erroll Luna, MD;  Location: [facility];   Service: General;  Laterality: Left;  . TONSILLECTOMY    . TOTAL HIP ARTHROPLASTY Left 01/13/2018   Procedure: LEFT TOTAL HIP ARTHROPLASTY ANTERIOR APPROACH;  Surgeon: Mcarthur Rossetti, MD;  Location: [facility];  Service: Orthopedics;  Laterality: Left;  . TUBAL LIGATION    . VAGINAL DELIVERY     x2 -     Family History  Problem Relation Age of Onset  . Heart disease Father 59       valve replacement    No Known Allergies  Current Outpatient Medications on File Prior to Visit  Medication Sig Dispense Refill  . atorvastatin (LIPITOR) 20 MG tablet TAKE (1/2) TABLET BY MOUTH DAILY 45 tablet 1  . calcium-vitamin D (OSCAL WITH D) 500-200 MG-UNIT per tablet Take 1 tablet by mouth daily.     . carvedilol (COREG) 6.25 MG tablet Take 1/2 tablet twice a day. 90 tablet 1  . lisinopril-hydrochlorothiazide (PRINZIDE,ZESTORETIC) 20-25 MG tablet Take 1 tablet by mouth daily. 90 tablet 1  . tamoxifen (NOLVADEX) 20 MG tablet Take 20 mg by mouth daily.     Marland Kitchen tolterodine (DETROL LA) 4 MG 24 hr capsule TAKE 1 CAPSULE (4 MG TOTAL) BY MOUTH DAILY. 90 capsule 1  . traZODone (DESYREL) 50 MG tablet Take 50 mg by mouth at bedtime as needed.      No current facility-administered medications on file prior to visit.     BP (!) 115/51 (BP Location: Left Arm, Cuff Size: Normal)   Pulse 77   Temp 97.8 F (36.6 C) (Oral)   Resp 18   Ht 5\' 5"  (1.651 m)   Wt 148 lb (67.1 kg)   SpO2 98%   BMI 24.63 kg/m    Objective:   Physical Exam  Constitutional: She appears well-developed and well-nourished.  Cardiovascular: Normal rate, regular rhythm and normal heart sounds.  No murmur heard. Pulmonary/Chest: Effort normal and breath sounds normal. No respiratory distress. She has no wheezes.  Skin: Skin is warm and dry.  Psychiatric: She has a normal mood and affect. Her behavior is normal. Judgment and thought content normal.          Assessment & Plan:  Hypertension-blood pressure stable on current  medications continue same.  Hyperlipidemia- lipids stable on statin.  Continue same.  Insomnia-stable without need for trazodone.  DC trazodone.  Overactive bladder-stable on Detrol LA.  Continue same.  Depression- was  largely situational related to chronic pain.  Now improved.  Monitor.  Hyperglycemia-noted while hospitalized.  Will obtain follow-up A1c.  Osteopenia- IV reclast Q6 months.   Flu shot today.

## 2018-03-16 NOTE — Patient Instructions (Signed)
Please complete lab work prior to leaving.   

## 2018-03-17 ENCOUNTER — Encounter: Payer: Self-pay | Admitting: Family

## 2018-03-17 ENCOUNTER — Other Ambulatory Visit: Payer: Self-pay | Admitting: Oncology

## 2018-03-17 DIAGNOSIS — Z853 Personal history of malignant neoplasm of breast: Secondary | ICD-10-CM

## 2018-03-20 ENCOUNTER — Telehealth: Payer: Self-pay | Admitting: *Deleted

## 2018-03-20 MED ORDER — LISINOPRIL-HYDROCHLOROTHIAZIDE 20-25 MG PO TABS: 1.0000 | ORAL_TABLET | Freq: Every day | ORAL | 1 refills | Status: DC

## 2018-03-20 MED ORDER — ATORVASTATIN CALCIUM 20 MG PO TABS: ORAL_TABLET | ORAL | 1 refills | Status: DC

## 2018-03-20 MED ORDER — TOLTERODINE TARTRATE ER 4 MG PO CP24: ORAL_CAPSULE | ORAL | 1 refills | Status: DC

## 2018-03-20 MED ORDER — CARVEDILOL 6.25 MG PO TABS: ORAL_TABLET | ORAL | 1 refills | Status: DC

## 2018-03-20 NOTE — Telephone Encounter (Addendum)
Received request from Kristopher Oppenheim for: Tolterodine ER 4mg , Lisinopril HCTZ 20/25, atorvastatin 20mg , carvedilol 6.25mg .Refills sent.

## 2018-03-20 NOTE — Addendum Note (Signed)
Addended by: Kelle Darting A on: 03/20/2018 04:19 PM   Modules accepted: Orders

## 2018-03-25 ENCOUNTER — Ambulatory Visit
Admission: RE | Admit: 2018-03-25 | Discharge: 2018-03-25 | Disposition: A | Discharge status: Home / Self Care | Payer: Medicare Other | Source: Ambulatory Visit | Attending: Oncology | Admitting: Oncology

## 2018-03-25 DIAGNOSIS — Z853 Personal history of malignant neoplasm of breast: Secondary | ICD-10-CM

## 2018-07-15 ENCOUNTER — Ambulatory Visit (INDEPENDENT_AMBULATORY_CARE_PROVIDER_SITE_OTHER): Payer: Medicare Other | Admitting: Family

## 2018-07-15 ENCOUNTER — Encounter: Payer: Self-pay | Admitting: Family

## 2018-07-15 VITALS — BP 147/70 | HR 81 | Temp 97.8°F | Resp 16 | Ht 66.0 in | Wt 152.0 lb

## 2018-07-15 DIAGNOSIS — E785 Hyperlipidemia, unspecified: Secondary | ICD-10-CM

## 2018-07-15 DIAGNOSIS — D649 Anemia, unspecified: Secondary | ICD-10-CM

## 2018-07-15 DIAGNOSIS — M8580 Other specified disorders of bone density and structure, unspecified site: Secondary | ICD-10-CM | POA: Diagnosis not present

## 2018-07-15 DIAGNOSIS — I10 Essential (primary) hypertension: Secondary | ICD-10-CM

## 2018-07-15 DIAGNOSIS — Z0000 Encounter for general adult medical examination without abnormal findings: Secondary | ICD-10-CM | POA: Diagnosis not present

## 2018-07-15 DIAGNOSIS — N3281 Overactive bladder: Secondary | ICD-10-CM

## 2018-07-15 LAB — CBC WITH DIFFERENTIAL/PLATELET
BASOS ABS: 0 10*3/uL (ref 0.0–0.1)
Basophils Relative: 0.8 % (ref 0.0–3.0)
EOS ABS: 0.2 10*3/uL (ref 0.0–0.7)
Eosinophils Relative: 4 % (ref 0.0–5.0)
HEMATOCRIT: 36.7 % (ref 36.0–46.0)
HEMOGLOBIN: 12.3 g/dL (ref 12.0–15.0)
LYMPHS PCT: 13 % (ref 12.0–46.0)
Lymphs Abs: 0.8 10*3/uL (ref 0.7–4.0)
MCHC: 33.5 g/dL (ref 30.0–36.0)
MCV: 95.6 fl (ref 78.0–100.0)
Monocytes Absolute: 0.4 10*3/uL (ref 0.1–1.0)
Monocytes Relative: 7.7 % (ref 3.0–12.0)
NEUTROS ABS: 4.3 10*3/uL (ref 1.4–7.7)
Neutrophils Relative %: 74.5 % (ref 43.0–77.0)
Platelets: 218 10*3/uL (ref 150.0–400.0)
RBC: 3.84 Mil/uL — AB (ref 3.87–5.11)
RDW: 13.8 % (ref 11.5–15.5)
WBC: 5.8 10*3/uL (ref 4.0–10.5)

## 2018-07-15 LAB — COMPREHENSIVE METABOLIC PANEL
ALT: 11 U/L (ref 0–35)
AST: 17 U/L (ref 0–37)
Albumin: 4.2 g/dL (ref 3.5–5.2)
Alkaline Phosphatase: 45 U/L (ref 39–117)
BILIRUBIN TOTAL: 0.5 mg/dL (ref 0.2–1.2)
BUN: 38 mg/dL — ABNORMAL HIGH (ref 6–23)
CALCIUM: 9.4 mg/dL (ref 8.4–10.5)
CO2: 28 meq/L (ref 19–32)
CREATININE: 1.03 mg/dL (ref 0.40–1.20)
Chloride: 103 mEq/L (ref 96–112)
GFR: 51.75 mL/min — ABNORMAL LOW (ref >60.00)
GLUCOSE: 97 mg/dL (ref 70–99)
Potassium: 5 mEq/L (ref 3.5–5.1)
Sodium: 139 mEq/L (ref 135–145)
Total Protein: 6.3 g/dL (ref 6.0–8.3)

## 2018-07-15 LAB — LIPID PANEL
CHOL/HDL RATIO: 2
Cholesterol: 171 mg/dL (ref 0–200)
HDL: 69.8 mg/dL (ref >39.00)
LDL Cholesterol: 82 mg/dL (ref 0–99)
NONHDL: 100.96
Triglycerides: 94 mg/dL (ref 0.0–149.0)
VLDL: 18.8 mg/dL (ref 0.0–40.0)

## 2018-07-15 MED ORDER — ZOSTER VAC RECOMB ADJUVANTED 50 MCG/0.5ML IM SUSR: INTRAMUSCULAR | 1 refills | Status: DC

## 2018-07-15 NOTE — Progress Notes (Signed)
Subjective:    Patient ID: Terry Mcneil, female    DOB: 1940-05-21, 78 y.o.   MRN: [id number]  HPI  Patient presents today for complete physical.  Immunizations: Tetanus is up-to-date, flu shot up to date Diet: healthy Exercise: enjoys the gym, feeling great  Colonoscopy: N/A Dexa: 2015- due Pap Smear: N/A Mammogram: Last mammogram was November 2019 Wt Readings from Last 3 Encounters:  07/15/18 152 lb (68.9 kg)  03/16/18 148 lb (67.1 kg)  01/13/18 162 lb (73.5 kg)   Hyperlipidemia-patient is maintained on atorvastatin 10 mg daily. Lab Results  Component Value Date   CHOL 135 09/15/2017   HDL 54.60 09/15/2017   LDLCALC 56 09/15/2017   TRIG 119.0 09/15/2017   CHOLHDL 2 09/15/2017   Overactive bladder-continues Detrol LA. Reports that this is helpful for her.  Hypertension-continues carvedilol 6.25 mg half tablet twice daily as well as lisinopril-HCTZ. BP Readings from Last 3 Encounters:  07/15/18 (!) 147/70  03/16/18 (!) 115/51  01/15/18 (!) 94/58   Osteopenia- she is being treated with zometa.  Reports that she is scheduled for dexa with oncology  Review of Systems  Constitutional: Negative for unexpected weight change.  HENT: Negative for hearing loss and rhinorrhea.   Eyes: Negative for visual disturbance.  Respiratory: Negative for cough.   Cardiovascular: Negative for leg swelling.  Gastrointestinal: Negative for constipation and diarrhea.  Genitourinary: Negative for dysuria and frequency.  Musculoskeletal: Positive for arthralgias. Negative for myalgias.  Skin: Negative for rash.  Neurological: Negative for headaches.  Hematological: Negative for adenopathy.  Psychiatric/Behavioral:       Denies depression/anxiety   Past Medical History:  Diagnosis Date  . Arthritis    back  . Cancer ([place]) 09/2016   left breast cancer  . Cataract   . Colon polyps   . Hypertension   . Osteopenia 10/11/2009  . Patellar fracture    Right Knee   . Urge incontinence       Social History   Socioeconomic History  . Marital status: Married    Spouse name: Dasia Guerrier  . Number of children: 2  . Years of education: Not on file  . Highest education level: Not on file  Occupational History  . Occupation: Retired  Scientific laboratory technician  . Financial resource strain: Not on file  . Food insecurity:    Worry: Not on file    Inability: Not on file  . Transportation needs:    Medical: Not on file    Non-medical: Not on file  Tobacco Use  . Smoking status: Former Smoker    Last attempt to quit: 08/27/1978    Years since quitting: 39.9  . Smokeless tobacco: Never Used  Substance and Sexual Activity  . Alcohol use: Yes    Alcohol/week: 7.0 standard drinks    Types: 7 Glasses of wine per week  . Drug use: No  . Sexual activity: Yes  Lifestyle  . Physical activity:    Days per week: Not on file    Minutes per session: Not on file  . Stress: Not on file  Relationships  . Social connections:    Talks on phone: Not on file    Gets together: Not on file    Attends religious service: Not on file    Active member of club or organization: Not on file    Attends meetings of clubs or organizations: Not on file    Relationship status: Not on file  . Intimate partner violence:  Fear of current or ex partner: Not on file    Emotionally abused: Not on file    Physically abused: Not on file    Forced sexual activity: Not on file  Other Topics Concern  . 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     Alcohol Use:  Moderate (Wine), 2 glasses 3 times a week   Drug Use:  None   Diet:  Weight Watchers    Exercise:  Not regular   Hobbies: Reading , Gardening ,  Grandchildren                Past Surgical History:  Procedure Laterality Date  . BREAST LUMPECTOMY Left 09/2016  . COLONOSCOPY    . EYE SURGERY Bilateral    Cataracts   . ORIF PATELLA Right 08/28/2012   Procedure: OPEN REDUCTION INTERNAL (ORIF) FIXATION PATELLA- RIGHT;  Surgeon: Newt Minion, MD;  Location: [facility];  Service: Orthopedics;  Laterality: Right;  Open Reduction Internal Fixation Right Patella  . RADIOACTIVE SEED GUIDED PARTIAL MASTECTOMY WITH AXILLARY SENTINEL LYMPH NODE BIOPSY Left 10/03/2016   Procedure: LEFT BREAST RADIOACTIVE SEED GUIDED PARTIAL MASTECTOMY WITH LEFT SENTINEL LYMPH NODE MAPPING;  Surgeon: Erroll Luna, MD;  Location: [facility];  Service: General;  Laterality: Left;  . TONSILLECTOMY    . TOTAL HIP ARTHROPLASTY Left 01/13/2018   Procedure: LEFT TOTAL HIP ARTHROPLASTY ANTERIOR APPROACH;  Surgeon: Mcarthur Rossetti, MD;  Location: [facility];  Service: Orthopedics;  Laterality: Left;  . TUBAL LIGATION    . VAGINAL DELIVERY     x2 -     Family History  Problem Relation Age of Onset  . Heart disease Father 28       valve replacement    No Known Allergies  Current Outpatient Medications on File Prior to Visit  Medication Sig Dispense Refill  . atorvastatin (LIPITOR) 20 MG tablet TAKE (1/2) TABLET BY MOUTH DAILY 45 tablet 1  . calcium-vitamin D (OSCAL WITH D) 500-200 MG-UNIT per tablet Take 1 tablet by mouth daily.     . carvedilol (COREG) 6.25 MG tablet Take 1/2 tablet twice a day. 90 tablet 1  . lisinopril-hydrochlorothiazide (PRINZIDE,ZESTORETIC) 20-25 MG tablet Take 1 tablet by mouth daily. 90 tablet 1  . tamoxifen (NOLVADEX) 20 MG tablet Take 20 mg by mouth daily.     Marland Kitchen tolterodine (DETROL LA) 4 MG 24 hr capsule TAKE 1 CAPSULE (4 MG TOTAL) BY MOUTH DAILY. 90 capsule 1  . traZODone (DESYREL) 50 MG tablet Take 50 mg by mouth at bedtime as needed.      No current facility-administered medications on file prior to visit.     BP  (!) 147/70 (BP Location: Right Arm, Patient Position: Sitting, Cuff Size: Small)   Pulse 81   Temp 97.8 F (36.6 C) (Oral)   Resp 16   Ht 5\' 6"  (1.676 m)   Wt 152 lb (68.9 kg)   HC 16" (40.6 cm)   SpO2 98%   BMI 24.53 kg/m       Objective:   Physical Exam  Physical Exam  Constitutional: [name] oriented to person, place, and time.  She appears well-developed and well-nourished. No distress.  HENT:  Head: Normocephalic and atraumatic.  Right Ear: Tympanic membrane and ear canal normal.  Left Ear: Tympanic membrane and ear canal normal.  Mouth/Throat: Oropharynx is clear and moist.  Eyes: Pupils are equal, round, and reactive to light. No scleral icterus.  Neck: Normal range of motion. No thyromegaly present.  Cardiovascular: Normal rate and regular rhythm.   No murmur heard. Pulmonary/Chest: Effort normal and breath sounds normal. No respiratory distress. He has no wheezes. She has no rales. She exhibits no tenderness.  Abdominal: Soft. Bowel sounds are normal. She exhibits no distension and no mass. There is no tenderness. There is no rebound and no guarding.  Musculoskeletal: She exhibits no edema.  Lymphadenopathy:    She has no cervical adenopathy.  Neurological: [name] alert and oriented to person, place, and time. She has normal patellar reflexes. She exhibits normal muscle tone. Coordination normal.  Skin: Skin is warm and dry.  Psychiatric: She has a normal mood and affect. Her behavior is normal. Judgment and thought content normal.  Breast/pelvic deferred        Assessment & Plan:  Preventive care-encourage patient to continue healthy diet and regular exercise.  [name] due for the Shingrix vaccine and I have sent a prescription to her pharmacy.  Osteopenia-this is being treated with Zometa by her oncologist.  She states she has an upcoming bone density scheduled with oncology.  Hypertension-blood pressure is up slightly today.  But is acceptable for her age.   Continue current dose of Coreg twice daily as well as lisinopril HCTZ.  Hyperlipidemia- tolerating statin.  Will obtain follow-up lipid panel.  Continue statin.  Overactive bladder-this is stable on Detrol LA.  Continue same.  Anemia- we will check follow-up CBC.    Assessment & Plan:

## 2018-08-26 ENCOUNTER — Ambulatory Visit (INDEPENDENT_AMBULATORY_CARE_PROVIDER_SITE_OTHER): Payer: Medicare Other | Admitting: Orthopaedic Surgery

## 2018-08-31 ENCOUNTER — Ambulatory Visit (INDEPENDENT_AMBULATORY_CARE_PROVIDER_SITE_OTHER): Payer: Self-pay

## 2018-08-31 ENCOUNTER — Ambulatory Visit (INDEPENDENT_AMBULATORY_CARE_PROVIDER_SITE_OTHER): Payer: Medicare Other | Admitting: Orthopaedic Surgery

## 2018-08-31 ENCOUNTER — Encounter (INDEPENDENT_AMBULATORY_CARE_PROVIDER_SITE_OTHER): Payer: Self-pay | Admitting: Orthopaedic Surgery

## 2018-08-31 ENCOUNTER — Other Ambulatory Visit: Payer: Self-pay

## 2018-08-31 DIAGNOSIS — Z96642 Presence of left artificial hip joint: Secondary | ICD-10-CM | POA: Diagnosis not present

## 2018-08-31 NOTE — Progress Notes (Signed)
The patient is a very pleasant and active 78 year old female who is now 8 months status post a left total hip arthroplasty.  She says she is doing well overall and has good range of motion and strength of that hip.  She walks with no limp and does not use an assistive device.  [name] someone that is had some chronic spine issues.  She said that she has been told by 2 different neurosurgeons that she is not a candidate for surgery on her spine.  She does wonder if a spinal cord stimulator would be worthwhile.  I did watch her walk and she walks with no limp.  Her leg lengths are equal.  I can move her left operative hip around fully but no pain in the hip at all and no blocks rotation.  She has some only slight subjective decrease sensation around her incision and she says this is improved greatly.  I did talk to her about referring her to Dr. Ernestina Patches if she wants to pursue further questioning about spinal cord stimulators.  I have some patients that I know that have those and have done well.  If it comes to her wanting to have referral she will let us know.  Otherwise, she will follow-up as needed for her left hip since it is doing well.  I talked her in detail in length about the things that we need to bring her back to [place] for that hip.  All question concerns were answered and addressed.  Follow-up is as needed.

## 2018-09-03 ENCOUNTER — Other Ambulatory Visit: Payer: Self-pay | Admitting: Family

## 2018-09-04 ENCOUNTER — Other Ambulatory Visit: Payer: Self-pay | Admitting: Family

## 2018-12-17 IMAGING — MG 2D DIGITAL DIAGNOSTIC UNILATERAL LEFT MAMMOGRAM WITH CAD AND ADJ
6 series · 6 of 14 positions shown · non-contrast
Comparison: Previous exam(s).

CLINICAL DATA: 75-year-old female presenting for screening recall
of a possible left breast mass.

EXAM:
2D DIGITAL DIAGNOSTIC UNILATERAL LEFT MAMMOGRAM WITH CAD AND ADJUNCT
TOMO
LEFT BREAST ULTRASOUND

[L CC synth-2D]
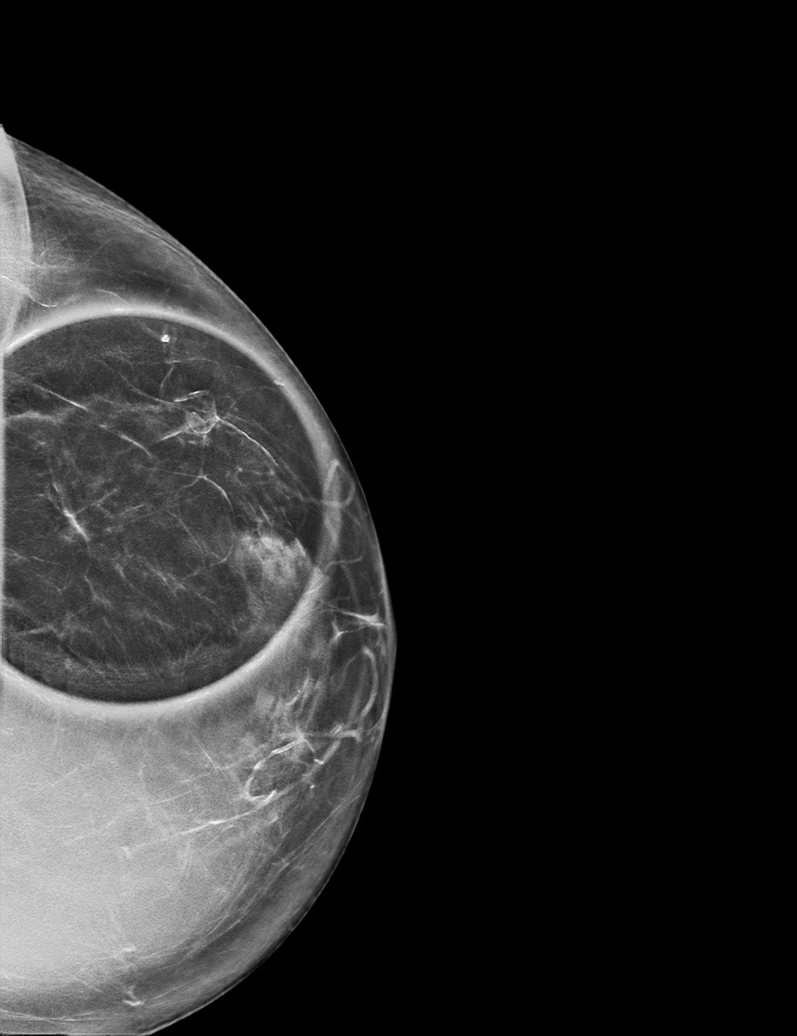

[L MLO synth-2D]
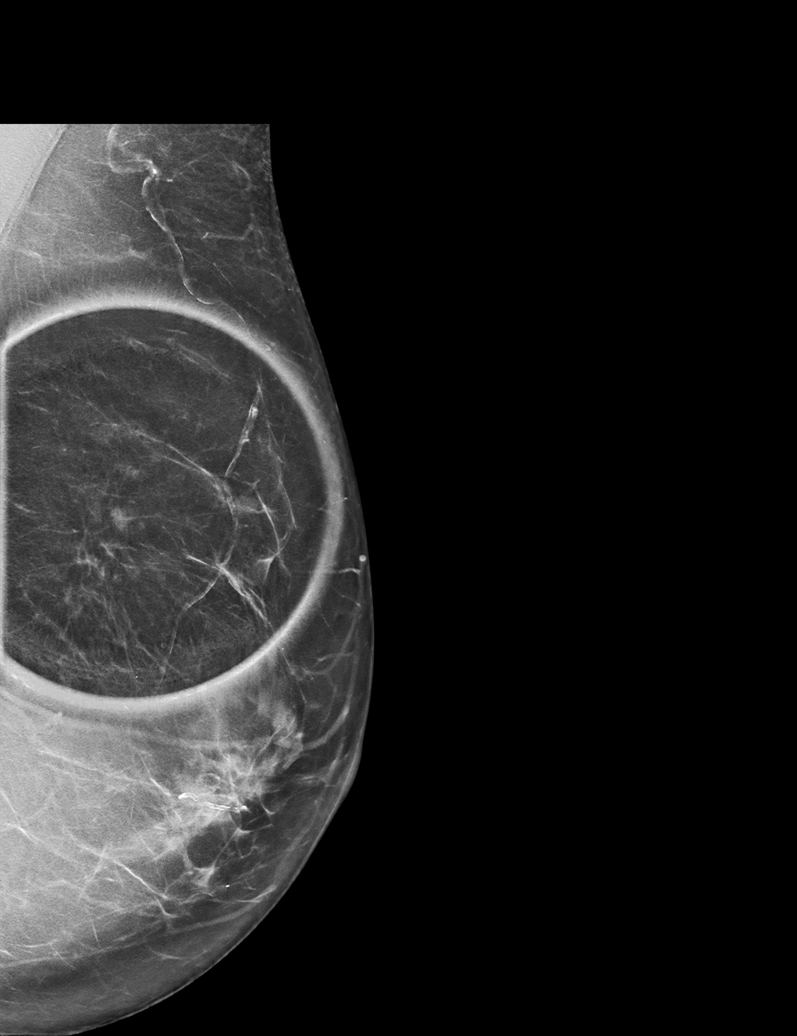

[L MLO]
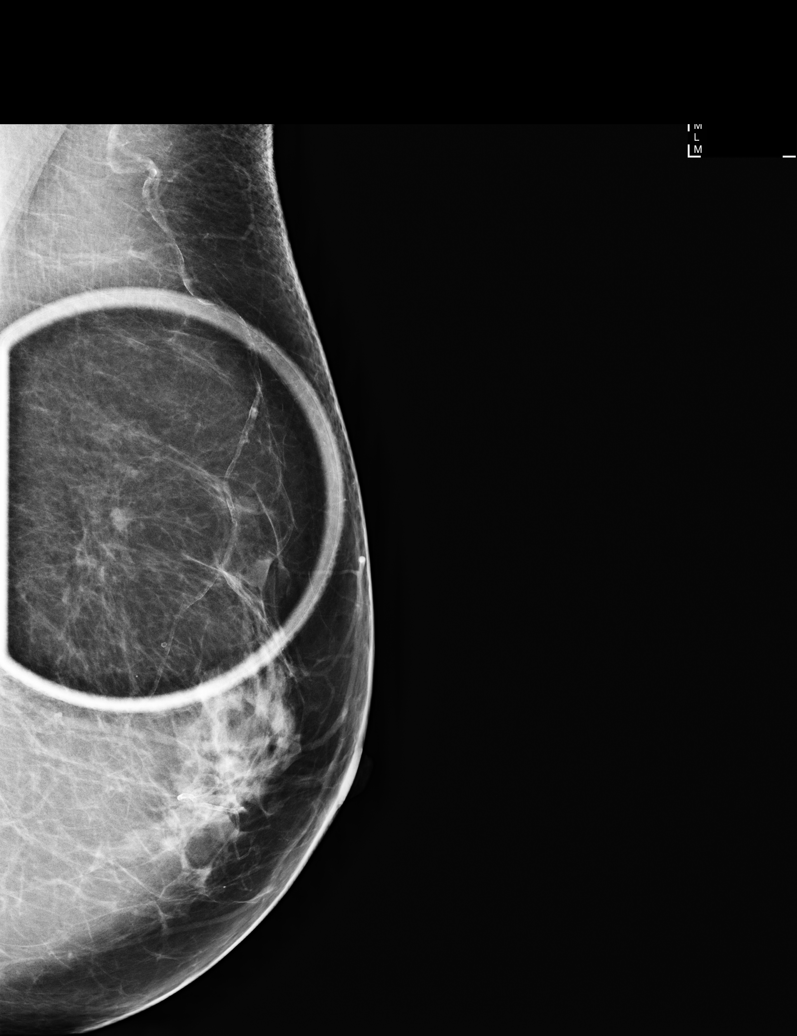

[L CC]
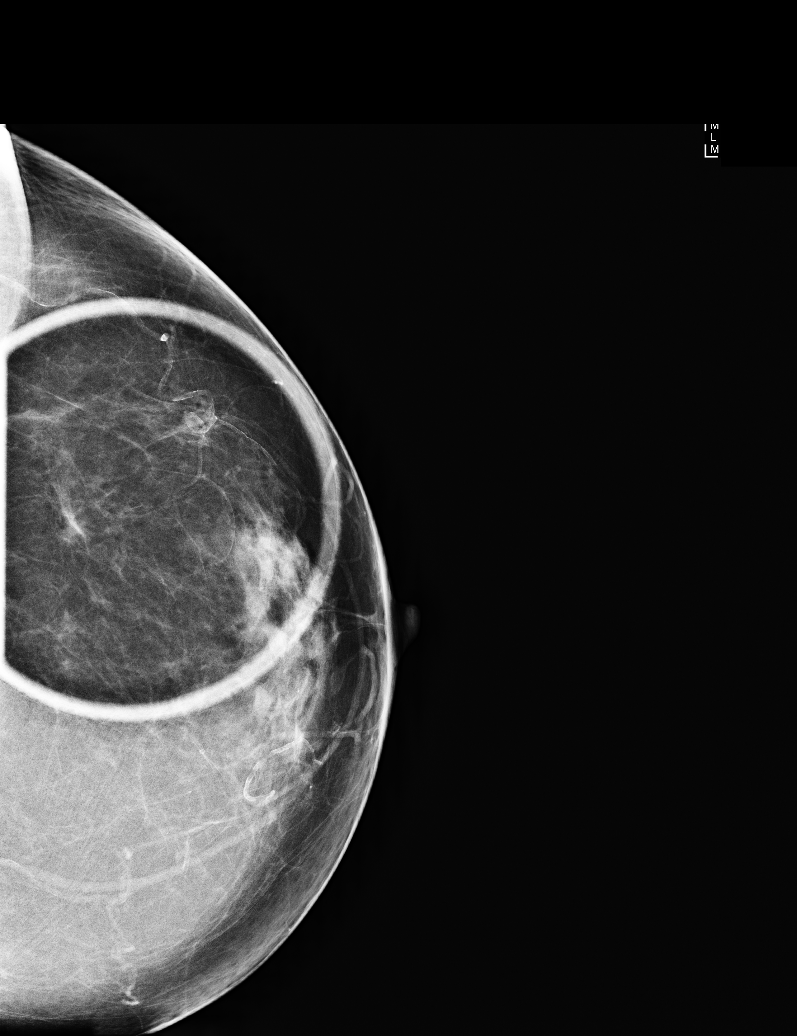

[L MLO tomo · tomo slice 36/71.0]
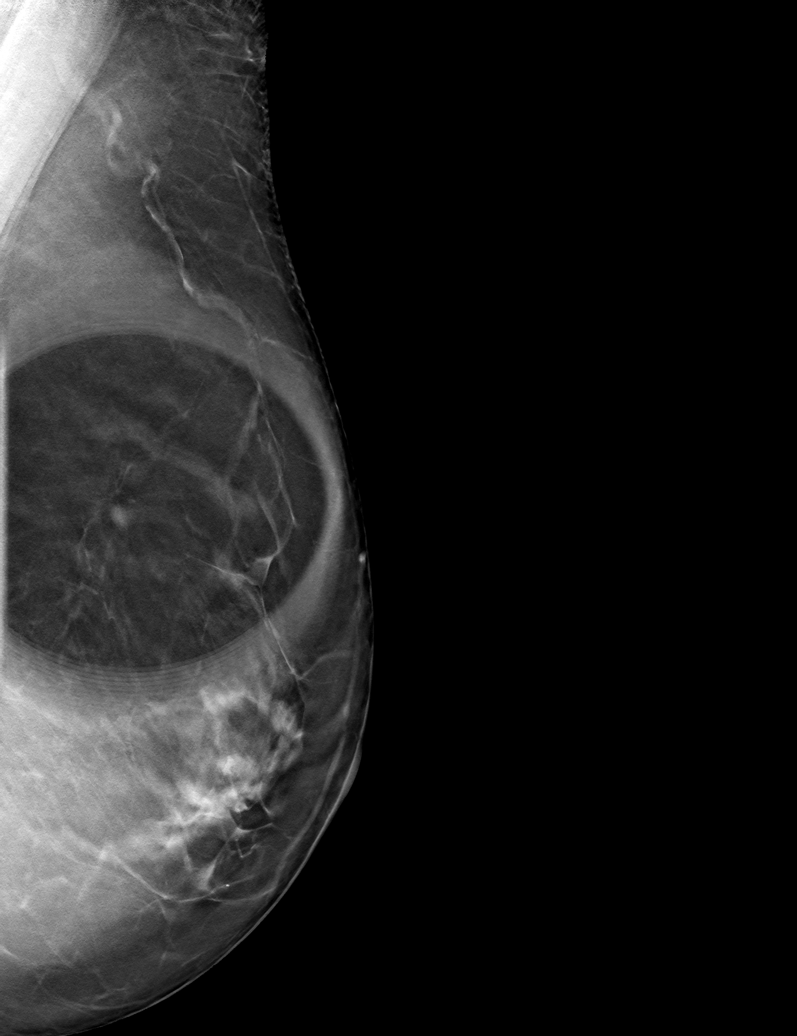

[L CC tomo · tomo slice 35/69.0]
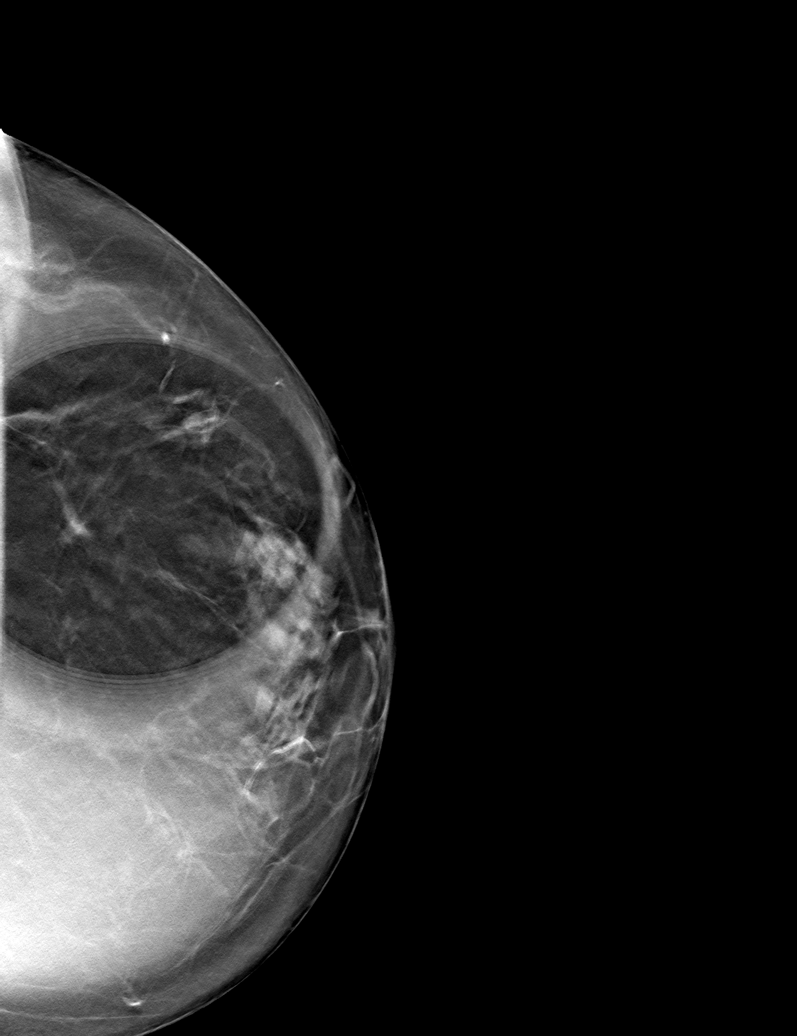

[6 of 14 positions shown; findings below may reference images not displayed]

ACR Breast Density Category b: There are scattered areas of
fibroglandular density.
FINDINGS: In the upper-outer quadrant of the left breast, posterior depth,
there is a persistent asymmetry, which on the MLO view appears to be
a 4 mm possibly spiculated mass. Ultrasound will be performed for
further evaluation.

Mammographic images were processed with CAD.

No definite palpable masses are identified in the upper-outer
quadrant of the left breast.

Ultrasound targeted to the left breast at [DATE], 4 cm from the nipple
demonstrates a small hypoechoic irregular shadowing mass measuring 4
x 4 x 4 mm, corresponding in size and position with the mammographic
finding of concern. This is located near the benign-appearing lymph
node seen on ultrasound a year ago. Ultrasound of the left axilla
demonstrates multiple normal-appearing lymph nodes. There is 1
prominent lymph node with a cortex measuring just under 4 mm,
however there is no suspicious focal bulge and the fatty hilum is
preserved.
IMPRESSION: 1.  There is a suspicious mass in the left breast at [DATE].

2. There is a slightly prominent left axillary lymph node which
appears otherwise morphologically normal.

RECOMMENDATION:
Ultrasound-guided biopsy is recommended for the 4 mm mass in the
left breast at [DATE].

I have discussed the findings and recommendations with the patient.
Results were also provided in writing at the conclusion of the
visit. If applicable, a reminder letter will be sent to the patient
regarding the next appointment.

BI-RADS CATEGORY  4: Suspicious.

## 2019-01-05 ENCOUNTER — Encounter: Payer: Self-pay | Admitting: Orthopedic Surgery

## 2019-01-05 ENCOUNTER — Ambulatory Visit: Payer: Self-pay

## 2019-01-05 ENCOUNTER — Ambulatory Visit (INDEPENDENT_AMBULATORY_CARE_PROVIDER_SITE_OTHER): Payer: Medicare Other | Admitting: Orthopedic Surgery

## 2019-01-05 VITALS — Ht 66.0 in | Wt 152.0 lb

## 2019-01-05 DIAGNOSIS — M5136 Other intervertebral disc degeneration, lumbar region: Secondary | ICD-10-CM

## 2019-01-05 DIAGNOSIS — M5410 Radiculopathy, site unspecified: Secondary | ICD-10-CM | POA: Diagnosis not present

## 2019-01-05 DIAGNOSIS — M5442 Lumbago with sciatica, left side: Secondary | ICD-10-CM

## 2019-01-05 MED ORDER — PREDNISONE 10 MG PO TABS: 20.0000 mg | ORAL_TABLET | Freq: Every day | ORAL | 0 refills | Status: DC

## 2019-01-05 NOTE — Progress Notes (Signed)
Office Visit Note   Patient: Terry Mcneil           Date of Birth: April 15, 1941           MRN: [id number] Visit Date: 01/05/2019              Requested by: [name], NP [place],  [place] [id number] PCP: [name], NP  Chief Complaint  Patient presents with  . Lower Back - Pain      HPI: Patient is a 78 year old woman who presents she is status post a left total hip arthroplasty that she states is doing well but recently had acute radicular symptoms down the left lower extremity with degenerative scoliosis.  Patient states that she treated this with bed rest for several days and her symptoms are significantly better.  She states she is not currently taking any pain medicine.  The pain radiates from her lumbar spine down the anterior aspect of her thigh.  Assessment & Plan: Visit Diagnoses:  1. Acute left-sided low back pain with left-sided sciatica   2. Degenerative disc disease, lumbar   3. Back pain with left-sided radiculopathy     Plan: A prescription was called in for prednisone that she will take 20 mg with breakfast for acute flareups wean to 10 mg and then every other day to get off the prednisone.  Follow-Up Instructions: Return if symptoms worsen or fail to improve.   Ortho Exam  Patient is alert, oriented, no adenopathy, well-dressed, normal affect, normal respiratory effort. Examination patient has a slow wide-based gait.  She has a negative straight leg raise bilaterally and no focal motor weakness in either lower extremity she has no radicular symptoms at this time.  Imaging: Xr Lumbar Spine 2-3 Views  Result Date: 01/05/2019 2 view radiographs of the lumbar spine shows a significant degenerative scoliosis with bone-on-bone contact throughout the lumbar spine.  Patient has a stable well aligned left total hip arthroplasty.  Lateral radiograph shows calcification of the aorta maximum diameter 25 mm.  No images are  attached to the encounter.  Labs: Lab Results  Component Value Date   HGBA1C 5.1 03/16/2018   HGBA1C 5.5 03/14/2015   HGBA1C 5.6 03/09/2014     Lab Results  Component Value Date   ALBUMIN 4.2 07/15/2018   ALBUMIN 4.1 09/15/2017   ALBUMIN 4.1 09/30/2016    No results found for: MG Lab Results  Component Value Date   VD25OH 49 10/08/2012    No results found for: PREALBUMIN CBC EXTENDED Latest Ref Rng & Units 07/15/2018 01/15/2018 01/14/2018  WBC 4.0 - 10.5 K/uL 5.8 8.4 8.6  RBC 3.87 - 5.11 Mil/uL 3.84(L) 2.59(L) 2.73(L)  HGB 12.0 - 15.0 g/dL 12.3 8.3(L) 8.8(L)  HCT 36.0 - 46.0 % 36.7 25.3(L) 26.6(L)  PLT 150.0 - 400.0 K/uL 218.0 127(L) 152  NEUTROABS 1.4 - 7.7 K/uL 4.3 - -  LYMPHSABS 0.7 - 4.0 K/uL 0.8 - -     Body mass index is 24.53 kg/m.  Orders:  Orders Placed This Encounter  Procedures  . XR Lumbar Spine 2-3 Views   Meds ordered this encounter  Medications  . predniSONE (DELTASONE) 10 MG tablet    Sig: Take 2 tablets (20 mg total) by mouth daily with breakfast.    Dispense:  60 tablet    Refill:  0     Procedures: No procedures performed  Clinical Data: No additional findings.  ROS:  All other systems  negative, except as noted in the HPI. Review of Systems  Objective: Vital Signs: Ht 5\' 6"  (1.676 m)   Wt 152 lb (68.9 kg)   BMI 24.53 kg/m   Specialty Comments:  No specialty comments available.  PMFS History: Patient Active Problem List   Diagnosis Date Noted  . Avascular necrosis of bone of hip, left ([place]) 01/13/2018  . Status post total replacement of left hip 01/13/2018  . Breast cancer ([place] [facility]) 03/18/2017  . Malignant neoplasm of upper-outer quadrant of left breast in female, estrogen receptor positive ([place]) 11/05/2016  . Insomnia 01/16/2016  . Lower back pain 06/10/2014  . Osteopenia 02/07/2014  . Other malaise and fatigue 04/17/2013  . Routine general medical examination at a health care facility 04/11/2013  . Need for prophylactic  vaccination and inoculation against influenza 04/11/2013  . Essential hypertension, benign 10/15/2012  . Hyperlipidemia 10/15/2012  . Overactive bladder 10/15/2012   Past Medical History:  Diagnosis Date  . Arthritis    back  . Cancer ([place]) 09/2016   left breast cancer  . Cataract   . Colon polyps   . Hypertension   . Osteopenia 10/11/2009  . Patellar fracture    Right Knee   . Urge incontinence     Family History  Problem Relation Age of Onset  . Heart disease Father 18       valve replacement    Past Surgical History:  Procedure Laterality Date  . BREAST LUMPECTOMY Left 09/2016  . COLONOSCOPY    . EYE SURGERY Bilateral    Cataracts   . ORIF PATELLA Right 08/28/2012   Procedure: OPEN REDUCTION INTERNAL (ORIF) FIXATION PATELLA- RIGHT;  Surgeon: Newt Minion, MD;  Location: [facility];  Service: Orthopedics;  Laterality: Right;  Open Reduction Internal Fixation Right Patella  . RADIOACTIVE SEED GUIDED PARTIAL MASTECTOMY WITH AXILLARY SENTINEL LYMPH NODE BIOPSY Left 10/03/2016   Procedure: LEFT BREAST RADIOACTIVE SEED GUIDED PARTIAL MASTECTOMY WITH LEFT SENTINEL LYMPH NODE MAPPING;  Surgeon: Erroll Luna, MD;  Location: [facility];  Service: General;  Laterality: Left;  . TONSILLECTOMY    . TOTAL HIP ARTHROPLASTY Left 01/13/2018   Procedure: LEFT TOTAL HIP ARTHROPLASTY ANTERIOR APPROACH;  Surgeon: Mcarthur Rossetti, MD;  Location: [facility];  Service: Orthopedics;  Laterality: Left;  . TUBAL LIGATION    . VAGINAL DELIVERY     x2 -    Social History   Occupational History  . Occupation: Retired  Tobacco Use  . Smoking status: Former Smoker    Quit date: 08/27/1978    Years since quitting: 40.3  . Smokeless tobacco: Never Used  Substance and Sexual Activity  . Alcohol use: Yes    Alcohol/week: 7.0 standard drinks    Types: 7 Glasses of wine per week  . Drug use: No  . Sexual activity: Yes

## 2019-01-14 ENCOUNTER — Other Ambulatory Visit: Payer: Self-pay

## 2019-01-15 ENCOUNTER — Ambulatory Visit (INDEPENDENT_AMBULATORY_CARE_PROVIDER_SITE_OTHER): Payer: Medicare Other | Admitting: Family

## 2019-01-15 ENCOUNTER — Encounter: Payer: Self-pay | Admitting: Family

## 2019-01-15 VITALS — BP 85/57 | HR 86 | Temp 97.0°F | Resp 16 | Ht 66.0 in | Wt 151.4 lb

## 2019-01-15 DIAGNOSIS — N3281 Overactive bladder: Secondary | ICD-10-CM | POA: Diagnosis not present

## 2019-01-15 DIAGNOSIS — E785 Hyperlipidemia, unspecified: Secondary | ICD-10-CM | POA: Diagnosis not present

## 2019-01-15 DIAGNOSIS — I10 Essential (primary) hypertension: Secondary | ICD-10-CM | POA: Diagnosis not present

## 2019-01-15 DIAGNOSIS — Z23 Encounter for immunization: Secondary | ICD-10-CM | POA: Diagnosis not present

## 2019-01-15 LAB — BASIC METABOLIC PANEL
BUN: 35 mg/dL — ABNORMAL HIGH (ref 6–23)
CO2: 26 mEq/L (ref 19–32)
Calcium: 9.1 mg/dL (ref 8.4–10.5)
Chloride: 98 mEq/L (ref 96–112)
Creatinine, Ser: 1.32 mg/dL — ABNORMAL HIGH (ref 0.40–1.20)
GFR: 38.81 mL/min — ABNORMAL LOW (ref >60.00)
Glucose, Bld: 109 mg/dL — ABNORMAL HIGH (ref 70–99)
Potassium: 4.6 mEq/L (ref 3.5–5.1)
Sodium: 135 mEq/L (ref 135–145)

## 2019-01-15 NOTE — Progress Notes (Signed)
Subjective:    Patient ID: Terry Mcneil, female    DOB: Feb 15, 1941, 78 y.o.   MRN: [id number]  HPI  Patient is a 78 year old female who presents today for routine follow-up.  Hyperlipidemia-patient is maintained on atorvastatin 20 mg once daily. Denies myalgia. Lab Results  Component Value Date   CHOL 171 07/15/2018   HDL 69.80 07/15/2018   LDLCALC 82 07/15/2018   TRIG 94.0 07/15/2018   CHOLHDL 2 07/15/2018   Hypertension- current blood pressure medication includes carvedilol 6.25 mg twice daily and lisinopril-hydrochlorothiazide 20-25 mg tabs.  BP Readings from Last 3 Encounters:  01/15/19 108/69  07/15/18 (!) 147/70  03/16/18 (!) 115/51   Overactive bladder-maintained on Detrol LA. Reports symptoms are stable on detrol.    History of breast cancer left breast-she is maintained on tamoxifen.   Review of Systems    see HPI  Past Medical History:  Diagnosis Date  . Arthritis    back  . Cancer ([place]) 09/2016   left breast cancer  . Cataract   . Colon polyps   . Hypertension   . Osteopenia 10/11/2009  . Patellar fracture    Right Knee   . Urge incontinence      Social History   Socioeconomic History  . Marital status: Married    Spouse name: Angla Zegarelli  . Number of children: 2  . Years of education: Not on file  . Highest education level: Not on file  Occupational History  . Occupation: Retired  Scientific laboratory technician  . Financial resource strain: Not on file  . Food insecurity    Worry: Not on file    Inability: Not on file  . Transportation needs    Medical: Not on file    Non-medical: Not on file  Tobacco Use  . Smoking status: Former Smoker    Quit date: 08/27/1978    Years since quitting: 40.4  . Smokeless tobacco: Never Used  Substance and Sexual Activity  . Alcohol use: Yes    Alcohol/week: 7.0 standard drinks    Types: 7 Glasses of wine per week  . Drug use: No  . Sexual activity: Yes  Lifestyle  . Physical activity    Days per week: Not on  file    Minutes per session: Not on file  . Stress: Not on file  Relationships  . Social Herbalist on phone: Not on file    Gets together: Not on file    Attends religious service: Not on file    Active member of club or organization: Not on file    Attends meetings of clubs or organizations: Not on file    Relationship status: Not on file  . Intimate partner violence    Fear of current or ex partner: Not on file    Emotionally abused: Not on file    Physically abused: Not on file    Forced sexual activity: Not on file  Other Topics Concern  . 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     Alcohol Use:  Moderate (Wine), 2 glasses 3 times a week   Drug Use:  None  Diet:  Weight Watchers    Exercise:  Not regular   Hobbies: Reading , Gardening , Grandchildren                Past Surgical History:  Procedure Laterality Date  . BREAST LUMPECTOMY Left 09/2016  . COLONOSCOPY    . EYE SURGERY Bilateral    Cataracts   . ORIF PATELLA Right 08/28/2012   Procedure: OPEN REDUCTION INTERNAL (ORIF) FIXATION PATELLA- RIGHT;  Surgeon: Newt Minion, MD;  Location: [facility] [facility];  Service: Orthopedics;  Laterality: Right;  Open Reduction Internal Fixation Right Patella  . RADIOACTIVE SEED GUIDED PARTIAL MASTECTOMY WITH AXILLARY SENTINEL LYMPH NODE BIOPSY Left 10/03/2016   Procedure: LEFT BREAST RADIOACTIVE SEED GUIDED PARTIAL MASTECTOMY WITH LEFT SENTINEL LYMPH NODE MAPPING;  Surgeon: Erroll Luna, MD;  Location: [facility];  Service: General;  Laterality: Left;  . TONSILLECTOMY    . TOTAL HIP ARTHROPLASTY Left 01/13/2018   Procedure: LEFT TOTAL HIP ARTHROPLASTY  ANTERIOR APPROACH;  Surgeon: Mcarthur Rossetti, MD;  Location: [facility];  Service: Orthopedics;  Laterality: Left;  . TUBAL LIGATION    . VAGINAL DELIVERY     x2 -     Family History  Problem Relation Age of Onset  . Heart disease Father 44       valve replacement    No Known Allergies  Current Outpatient Medications on File Prior to Visit  Medication Sig Dispense Refill  . atorvastatin (LIPITOR) 20 MG tablet TAKE 1/2 TABLET BY MOUTH ONCE DAILY 45 tablet 1  . calcium-vitamin D (OSCAL WITH D) 500-200 MG-UNIT per tablet Take 1 tablet by mouth daily.     . carvedilol (COREG) 6.25 MG tablet TAKE 1/2 TABLET BY MOUTH TWICE DAILY 90 tablet 1  . lisinopril-hydrochlorothiazide (ZESTORETIC) 20-25 MG tablet TAKE ONE TABLET BY MOUTH DAILY 90 tablet 1  . tamoxifen (NOLVADEX) 20 MG tablet Take 20 mg by mouth daily.     Marland Kitchen tolterodine (DETROL LA) 4 MG 24 hr capsule TAKE ONE CAPSULE BY MOUTH DAILY 90 capsule 1  . traZODone (DESYREL) 50 MG tablet Take 50 mg by mouth at bedtime as needed.     . Zoster Vaccine Adjuvanted [facility]) injection Inject 0.5mg  IM now and again in 2-6 months. 0.5 mL 1   No current facility-administered medications on file prior to visit.     BP 108/69 (BP Location: Right Arm, Patient Position: Sitting, Cuff Size: Small)   Pulse 86   Temp (!) 97 F (36.1 C) (Temporal)   Resp 16   Ht 5\' 6"  (1.676 m)   Wt 151 lb 6.4 oz (68.7 kg)   SpO2 95%   BMI 24.44 kg/m    Objective:   Physical Exam Constitutional:      Appearance: [name] well-developed.  Neck:     Musculoskeletal: Neck supple.     Thyroid: No thyromegaly.  Cardiovascular:     Rate and Rhythm: Normal rate and regular rhythm.     Heart sounds: Normal heart sounds. No murmur.  Pulmonary:     Effort: Pulmonary effort is normal. No respiratory distress.     Breath sounds: Normal breath sounds. No wheezing.  Skin:    General: Skin is warm and dry.  Neurological:     Mental Status: [name] alert and oriented  to person, place, and time.  Psychiatric:        Behavior: Behavior normal.        Thought Content: Thought content normal.  Judgment: Judgment normal.           Assessment & Plan:  HTN- initial BP was 108/69.  I attempted manual recheck and could not auscultate BP.  Repeat with automatic bp machine was 75/51, 85/57 with a different machine. Pt is asymptomatic.    Will d/c coreg, continue lisinopril-hctz but pt advised to check blood pressure once daily prior to meds and hold lisinopril-hctz if SBP <110.   Overactive bladder- stable on detrol LA.    Hyperlipidemia- At goal, tolerating statin.

## 2019-01-15 NOTE — Patient Instructions (Signed)
Stop coreg. Check blood pressure once daily prior to your lisinopril-hct dose.  Do not take lisinopril hctz if systolic (top number) of your blood pressure is <110.  Send me your blood pressure readings via mychart in 1 week.  Call sooner if bp >160/100 or if <90/50.    Drink plenty of water today.

## 2019-01-20 ENCOUNTER — Other Ambulatory Visit: Payer: Self-pay | Admitting: Family Medicine

## 2019-01-20 DIAGNOSIS — N289 Disorder of kidney and ureter, unspecified: Secondary | ICD-10-CM

## 2019-01-27 ENCOUNTER — Other Ambulatory Visit: Payer: Self-pay

## 2019-01-27 ENCOUNTER — Telehealth: Payer: Self-pay | Admitting: Family

## 2019-01-27 ENCOUNTER — Other Ambulatory Visit (INDEPENDENT_AMBULATORY_CARE_PROVIDER_SITE_OTHER): Payer: Medicare Other

## 2019-01-27 DIAGNOSIS — N289 Disorder of kidney and ureter, unspecified: Secondary | ICD-10-CM

## 2019-01-27 NOTE — Telephone Encounter (Signed)
Pt dropped off document to be seen by provider (BP taking during the wk) Document in a small white envelope. Put at front office tray under providers name.

## 2019-01-28 LAB — BASIC METABOLIC PANEL
BUN: 18 mg/dL (ref 6–23)
CO2: 25 mEq/L (ref 19–32)
Calcium: 9.7 mg/dL (ref 8.4–10.5)
Chloride: 100 mEq/L (ref 96–112)
Creatinine, Ser: 0.89 mg/dL (ref 0.40–1.20)
GFR: 61.16 mL/min (ref >60.00)
Glucose, Bld: 103 mg/dL — ABNORMAL HIGH (ref 70–99)
Potassium: 3.9 mEq/L (ref 3.5–5.1)
Sodium: 136 mEq/L (ref 135–145)

## 2019-01-28 NOTE — Telephone Encounter (Signed)
Document placed on Melissa's folder

## 2019-01-29 ENCOUNTER — Encounter: Payer: Self-pay | Admitting: Family

## 2019-01-29 NOTE — Progress Notes (Signed)
Mailed out to patient 

## 2019-02-02 ENCOUNTER — Telehealth: Payer: Self-pay | Admitting: Family

## 2019-02-02 NOTE — Telephone Encounter (Signed)
Patient advised to continue same management, she verbalized understanding.

## 2019-02-02 NOTE — Telephone Encounter (Signed)
Pt dropped off copy of blood pressure readings x 7 days.    Range 116-150/70-79.  BP Readings from Last 3 Encounters:  01/15/19 (!) 85/57  07/15/18 (!) 147/70  03/16/18 (!) 115/51   Please advise pt that I think her blood pressure readings look good and I would like for her to remain off of Coreg.

## 2019-02-11 ENCOUNTER — Other Ambulatory Visit: Payer: Self-pay

## 2019-02-12 ENCOUNTER — Ambulatory Visit: Payer: Medicare Other | Admitting: Family

## 2019-02-12 ENCOUNTER — Emergency Department (HOSPITAL_BASED_OUTPATIENT_CLINIC_OR_DEPARTMENT_OTHER): Payer: Medicare Other

## 2019-02-12 ENCOUNTER — Observation Stay (HOSPITAL_BASED_OUTPATIENT_CLINIC_OR_DEPARTMENT_OTHER)
Admission: EM | Admit: 2019-02-12 | Discharge: 2019-02-13 | Disposition: A | Discharge status: Home / Self Care | Payer: Medicare Other | Attending: Orthopedic Surgery | Admitting: Orthopedic Surgery

## 2019-02-12 ENCOUNTER — Encounter: Payer: Self-pay | Admitting: Family

## 2019-02-12 VITALS — BP 151/86 | HR 89 | Temp 96.7°F | Resp 16 | Ht 66.0 in | Wt 156.0 lb

## 2019-02-12 DIAGNOSIS — Z853 Personal history of malignant neoplasm of breast: Secondary | ICD-10-CM | POA: Diagnosis not present

## 2019-02-12 DIAGNOSIS — S73005A Unspecified dislocation of left hip, initial encounter: Secondary | ICD-10-CM | POA: Diagnosis present

## 2019-02-12 DIAGNOSIS — Z86010 Personal history of colonic polyps: Secondary | ICD-10-CM | POA: Insufficient documentation

## 2019-02-12 DIAGNOSIS — M1990 Unspecified osteoarthritis, unspecified site: Secondary | ICD-10-CM | POA: Insufficient documentation

## 2019-02-12 DIAGNOSIS — I10 Essential (primary) hypertension: Secondary | ICD-10-CM | POA: Diagnosis not present

## 2019-02-12 DIAGNOSIS — N3281 Overactive bladder: Secondary | ICD-10-CM | POA: Diagnosis not present

## 2019-02-12 DIAGNOSIS — M8580 Other specified disorders of bone density and structure, unspecified site: Secondary | ICD-10-CM | POA: Diagnosis not present

## 2019-02-12 DIAGNOSIS — Z96642 Presence of left artificial hip joint: Secondary | ICD-10-CM | POA: Insufficient documentation

## 2019-02-12 DIAGNOSIS — W19XXXA Unspecified fall, initial encounter: Secondary | ICD-10-CM | POA: Diagnosis not present

## 2019-02-12 DIAGNOSIS — Z8249 Family history of ischemic heart disease and other diseases of the circulatory system: Secondary | ICD-10-CM | POA: Insufficient documentation

## 2019-02-12 DIAGNOSIS — Y92481 Parking lot as the place of occurrence of the external cause: Secondary | ICD-10-CM | POA: Diagnosis not present

## 2019-02-12 DIAGNOSIS — C50919 Malignant neoplasm of unspecified site of unspecified female breast: Secondary | ICD-10-CM

## 2019-02-12 DIAGNOSIS — M479 Spondylosis, unspecified: Secondary | ICD-10-CM | POA: Insufficient documentation

## 2019-02-12 DIAGNOSIS — Z79899 Other long term (current) drug therapy: Secondary | ICD-10-CM | POA: Diagnosis not present

## 2019-02-12 DIAGNOSIS — T84021A Dislocation of internal left hip prosthesis, initial encounter: Secondary | ICD-10-CM | POA: Diagnosis not present

## 2019-02-12 DIAGNOSIS — Z20828 Contact with and (suspected) exposure to other viral communicable diseases: Secondary | ICD-10-CM | POA: Insufficient documentation

## 2019-02-12 DIAGNOSIS — Z87891 Personal history of nicotine dependence: Secondary | ICD-10-CM | POA: Insufficient documentation

## 2019-02-12 DIAGNOSIS — Y939 Activity, unspecified: Secondary | ICD-10-CM | POA: Diagnosis not present

## 2019-02-12 LAB — CBC WITH DIFFERENTIAL/PLATELET
Abs Immature Granulocytes: 0.03 10*3/uL (ref 0.00–0.07)
Basophils Absolute: 0 10*3/uL (ref 0.0–0.1)
Basophils Relative: 0 %
Eosinophils Absolute: 0 10*3/uL (ref 0.0–0.5)
Eosinophils Relative: 0 %
HCT: 38.4 % (ref 36.0–46.0)
Hemoglobin: 12.7 g/dL (ref 12.0–15.0)
Immature Granulocytes: 0 %
Lymphocytes Relative: 7 %
Lymphs Abs: 0.7 10*3/uL (ref 0.7–4.0)
MCH: 32.4 pg (ref 26.0–34.0)
MCHC: 33.1 g/dL (ref 30.0–36.0)
MCV: 98 fL (ref 80.0–100.0)
Monocytes Absolute: 0.5 10*3/uL (ref 0.1–1.0)
Monocytes Relative: 5 %
Neutro Abs: 8.6 10*3/uL — ABNORMAL HIGH (ref 1.7–7.7)
Neutrophils Relative %: 88 %
Platelets: 187 10*3/uL (ref 150–400)
RBC: 3.92 MIL/uL (ref 3.87–5.11)
RDW: 13.1 % (ref 11.5–15.5)
WBC: 9.8 10*3/uL (ref 4.0–10.5)
nRBC: 0 % (ref 0.0–0.2)

## 2019-02-12 LAB — BASIC METABOLIC PANEL
Anion gap: 11 (ref 5–15)
BUN: 15 mg/dL (ref 8–23)
CO2: 23 mmol/L (ref 22–32)
Calcium: 8.8 mg/dL — ABNORMAL LOW (ref 8.9–10.3)
Chloride: 105 mmol/L (ref 98–111)
Creatinine, Ser: 0.71 mg/dL (ref 0.44–1.00)
GFR calc Af Amer: >60 mL/min (ref >60)
GFR calc non Af Amer: >60 mL/min (ref >60)
Glucose, Bld: 107 mg/dL — ABNORMAL HIGH (ref 70–99)
Potassium: 3.8 mmol/L (ref 3.5–5.1)
Sodium: 139 mmol/L (ref 135–145)

## 2019-02-12 LAB — PROTIME-INR
INR: 1.1 (ref 0.8–1.2)
Prothrombin Time: 13.7 seconds (ref 11.4–15.2)

## 2019-02-12 LAB — SARS CORONAVIRUS 2 BY RT PCR (HOSPITAL ORDER, PERFORMED IN ~~LOC~~ HOSPITAL LAB): SARS Coronavirus 2: NEGATIVE

## 2019-02-12 MED ORDER — OXYCODONE-ACETAMINOPHEN 5-325 MG PO TABS
1.0000 | ORAL_TABLET | Freq: Once | ORAL | Status: AC
  Administered 2019-02-12: 13:00:00 1 via ORAL
  Filled 2019-02-12: qty 1

## 2019-02-12 MED ORDER — MORPHINE SULFATE (PF) 4 MG/ML IV SOLN
4.0000 mg | INTRAVENOUS | Status: DC | PRN
  Administered 2019-02-13: 4 mg via INTRAVENOUS
  Filled 2019-02-12: qty 1

## 2019-02-12 MED ORDER — MORPHINE SULFATE (PF) 2 MG/ML IV SOLN
2.0000 mg | INTRAVENOUS | Status: DC | PRN
  Administered 2019-02-12 (×2): 2 mg via INTRAVENOUS
  Filled 2019-02-12 (×2): qty 1

## 2019-02-12 MED ORDER — CARVEDILOL 6.25 MG PO TABS: ORAL_TABLET | ORAL | 3 refills | Status: DC

## 2019-02-12 MED ORDER — MORPHINE SULFATE (PF) 4 MG/ML IV SOLN
4.0000 mg | Freq: Once | INTRAVENOUS | Status: AC
  Administered 2019-02-12: 4 mg via INTRAVENOUS
  Filled 2019-02-12: qty 1

## 2019-02-12 MED ORDER — PROPOFOL 10 MG/ML IV BOLUS
INTRAVENOUS | Status: AC | PRN
  Administered 2019-02-12: 70.8 mg via INTRAVENOUS

## 2019-02-12 MED ORDER — SODIUM CHLORIDE 0.9 % IV SOLN
Freq: Once | INTRAVENOUS | Status: AC
  Administered 2019-02-12: 16:00:00 via INTRAVENOUS

## 2019-02-12 MED ORDER — SODIUM CHLORIDE 0.9 % IV SOLN
Freq: Once | INTRAVENOUS | Status: AC
  Administered 2019-02-12: 23:00:00 via INTRAVENOUS

## 2019-02-12 MED ORDER — PROPOFOL 10 MG/ML IV BOLUS
1.0000 mg/kg | Freq: Once | INTRAVENOUS | Status: DC
  Filled 2019-02-12: qty 20

## 2019-02-12 NOTE — ED Provider Notes (Signed)
.  Sedation  Date/Time: 02/12/2019 6:07 PM Performed by: Wyvonnia Dusky, MD Authorized by: Wyvonnia Dusky, MD   Consent:    Consent obtained:  Verbal   Consent given by:  Patient   Risks discussed:  Allergic reaction, dysrhythmia, inadequate sedation, nausea, prolonged hypoxia resulting in organ damage, prolonged sedation necessitating reversal, respiratory compromise necessitating ventilatory assistance and intubation and vomiting   Alternatives discussed:  Analgesia without sedation, anxiolysis and regional anesthesia Universal protocol:    Procedure explained and questions answered to patient or proxy's satisfaction: yes     Relevant documents present and verified: yes     Test results available and properly labeled: yes     Imaging studies available: yes     Required blood products, implants, devices, and special equipment available: yes     Site/side marked: yes     Immediately prior to procedure a time out was called: yes     Patient identity confirmation method:  Verbally with patient Indications:    Procedure necessitating sedation performed by:  Physician performing sedation Pre-sedation assessment:    Time since last food or drink:  6 hours   ASA classification: class 2 - patient with mild systemic disease     Neck mobility: normal     Mouth opening:  3 or more finger widths   Thyromental distance:  4 finger widths   Mallampati score:  II - soft palate, uvula, fauces visible   Pre-sedation assessments completed and reviewed: airway patency, cardiovascular function, hydration status, mental status, nausea/vomiting, pain level, respiratory function and temperature   Immediate pre-procedure details:    Reassessment: Patient reassessed immediately prior to procedure     Reviewed: vital signs, relevant labs/tests and NPO status     Verified: bag valve mask available, emergency equipment available, intubation equipment available, IV patency confirmed, oxygen available and  suction available   Procedure details (see MAR for exact dosages):    Preoxygenation:  Nasal cannula   Sedation:  Propofol   Intended level of sedation: deep   Intra-procedure monitoring:  Blood pressure monitoring, cardiac monitor, continuous pulse oximetry, frequent LOC assessments, frequent vital sign checks and continuous capnometry   Intra-procedure events: none     Total Provider sedation time (minutes):  30 Post-procedure details:    Attendance: Constant attendance by certified staff until patient recovered     Recovery: Patient returned to pre-procedure baseline     Post-sedation assessments completed and reviewed: airway patency, cardiovascular function, hydration status, mental status, nausea/vomiting, pain level, respiratory function and temperature     Patient is stable for discharge or admission: yes     Patient tolerance:  Tolerated well, no immediate complications      Wyvonnia Dusky, MD 02/12/19 [phone]

## 2019-02-12 NOTE — ED Triage Notes (Signed)
Pt brought in by [facility] as a transfer from [facility] Pt had a mechanical fall today and dislocated her left hip. Pt A+Ox4, endorses relief of pain after receiving morphine x1 hour ago.

## 2019-02-12 NOTE — ED Notes (Signed)
Pt transported to radiology on stretcher.

## 2019-02-12 NOTE — Patient Instructions (Signed)
Please change coreg to 1/2 tab twice daily. Check your blood pressure once daily for 1 week and send [place] your readings.

## 2019-02-12 NOTE — Sedation Documentation (Signed)
Unable to assess pain due to sedation.  

## 2019-02-12 NOTE — ED Provider Notes (Addendum)
[facility] EMERGENCY DEPARTMENT Provider Note   CSN: [id number] Arrival date & time:        History   Chief Complaint Chief Complaint  Patient presents with  . Fall  . Leg Pain    HPI Terry Mcneil is a 78 y.o. female who presents with a fall. PMH significant for HTN, HLD, hx of breast cancer, overactive bladder. Past surgical hx significant for L hip replacement.  The patient states that she was here with her for primary care office visit today upstairs.  [name] walking out to her car and she is unsure of exactly what happened but may have tripped and fell forward.  She was able to ambulate after the accident.  Bystanders were able to help her up and brought her to the emergency department downstairs.  She states that she has an abrasion on her right hand but no pain.  Her primary area of pain is over the left hip and left knee.  [name] not on any blood thinners.  She was in her usual state of health today and denies dizziness, syncope, head injury. She has been NPO since 9AM     HPI  Past Medical History:  Diagnosis Date  . Arthritis    back  . Cancer ([place]) 09/2016   left breast cancer  . Cataract   . Colon polyps   . Hypertension   . Osteopenia 10/11/2009  . Patellar fracture    Right Knee   . Urge incontinence     Patient Active Problem List   Diagnosis Date Noted  . Avascular necrosis of bone of hip, left ([place]) 01/13/2018  . Status post total replacement of left hip 01/13/2018  . Breast cancer ([place]) 03/18/2017  . Malignant neoplasm of upper-outer quadrant of left breast in female, estrogen receptor positive ([place]) 11/05/2016  . Insomnia 01/16/2016  . Lower back pain 06/10/2014  . Osteopenia 02/07/2014  . Other malaise and fatigue 04/17/2013  . Routine general medical examination at a health care facility 04/11/2013  . Need for prophylactic vaccination and inoculation against influenza 04/11/2013  . Essential hypertension, benign 10/15/2012  .  Hyperlipidemia 10/15/2012  . Overactive bladder 10/15/2012    Past Surgical History:  Procedure Laterality Date  . BREAST LUMPECTOMY Left 09/2016  . COLONOSCOPY    . EYE SURGERY Bilateral    Cataracts   . ORIF PATELLA Right 08/28/2012   Procedure: OPEN REDUCTION INTERNAL (ORIF) FIXATION PATELLA- RIGHT;  Surgeon: Newt Minion, MD;  Location: [place];  Service: Orthopedics;  Laterality: Right;  Open Reduction Internal Fixation Right Patella  . RADIOACTIVE SEED GUIDED PARTIAL MASTECTOMY WITH AXILLARY SENTINEL LYMPH NODE BIOPSY Left 10/03/2016   Procedure: LEFT BREAST RADIOACTIVE SEED GUIDED PARTIAL MASTECTOMY WITH LEFT SENTINEL LYMPH NODE MAPPING;  Surgeon: Erroll Luna, MD;  Location: [place];  Service: General;  Laterality: Left;  . TONSILLECTOMY    . TOTAL HIP ARTHROPLASTY Left 01/13/2018   Procedure: LEFT TOTAL HIP ARTHROPLASTY ANTERIOR APPROACH;  Surgeon: Mcarthur Rossetti, MD;  Location: [facility];  Service: Orthopedics;  Laterality: Left;  . TUBAL LIGATION    . VAGINAL DELIVERY     x2 -      OB History   No obstetric history on file.      Home Medications    Prior to Admission medications   Medication Sig Start Date End Date Taking? Authorizing Provider  atorvastatin (LIPITOR) 20 MG tablet TAKE 1/2 TABLET BY MOUTH ONCE DAILY  09/04/18   [name], NP  calcium-vitamin D (OSCAL WITH D) 500-200 MG-UNIT per tablet Take 1 tablet by mouth daily.     [provider]  carvedilol (COREG) 6.25 MG tablet 1/2 tablet by mouth twice daily 02/12/19   [name], NP  lisinopril-hydrochlorothiazide (ZESTORETIC) 20-25 MG tablet TAKE ONE TABLET BY MOUTH DAILY 09/03/18   [name], NP  tamoxifen (NOLVADEX) 20 MG tablet Take 20 mg by mouth daily.  10/06/17   [provider]  tolterodine (DETROL LA) 4 MG 24 hr capsule TAKE ONE CAPSULE BY MOUTH DAILY 09/03/18   [name], NP    Family History Family History  Problem Relation  Age of Onset  . Heart disease Father 26       valve replacement    Social History Social History   Tobacco Use  . Smoking status: Former Smoker    Quit date: 08/27/1978    Years since quitting: 40.4  . Smokeless tobacco: Never Used  Substance Use Topics  . Alcohol use: Yes    Alcohol/week: 7.0 standard drinks    Types: 7 Glasses of wine per week  . Drug use: No     Allergies   Patient has no known allergies.   Review of Systems Review of Systems  Musculoskeletal: Positive for arthralgias.  Skin: Positive for wound.  Neurological: Negative for dizziness, syncope and light-headedness.  All other systems reviewed and are negative.    Physical Exam Updated Vital Signs BP (!) 160/98 (BP Location: Right Arm)   Pulse 87   Temp 97.8 F (36.6 C) (Oral)   Resp 16   Ht 5\' 6"  (1.676 m)   Wt 70.8 kg   SpO2 97%   BMI 25.18 kg/m   Physical Exam Vitals signs and nursing note reviewed.  Constitutional:      General: [name] not in acute distress.    Appearance: Normal appearance. [name] well-developed. [name] not ill-appearing.     Comments: Calm, cooperative. Slightly shaky  HENT:     Head: Normocephalic and atraumatic.  Eyes:     General: No scleral icterus.       Right eye: No discharge.        Left eye: No discharge.     Conjunctiva/sclera: Conjunctivae normal.     Pupils: Pupils are equal, round, and reactive to light.  Neck:     Musculoskeletal: Normal range of motion.  Cardiovascular:     Rate and Rhythm: Normal rate and regular rhythm.  Pulmonary:     Effort: Pulmonary effort is normal. No respiratory distress.     Breath sounds: Normal breath sounds.  Abdominal:     General: There is no distension.  Musculoskeletal:     Comments: RUE: Abrasions on the palm. Normal ROM of the hand. No tenderness. Radial pulse intact  RLE: No obvious swelling, deformity, or warmth of the hip or knee. Old surgical scar over the knee from fixation of patella fracture. No  tenderness. Normal ROM of hip, knee, ankle. N/V intact.  LUE: No injury. No tenderness. Intact radial pulse  LLE: Hip is flexed, shortened, and tender. There is an abrasion on the knee but no significant tenderness. No lower leg, ankle, foot tenderness. 2+ DP pulse   Skin:    General: Skin is warm and dry.  Neurological:     Mental Status: [name] alert and oriented to person, place, and time.  Psychiatric:        Behavior: Behavior normal.  ED Treatments / Results  Labs (all labs ordered are listed, but only abnormal results are displayed) Labs Reviewed  BASIC METABOLIC PANEL - Abnormal; Notable for the following components:      Result Value   Glucose, Bld 107 (*)    Calcium 8.8 (*)    All other components within normal limits  CBC WITH DIFFERENTIAL/PLATELET - Abnormal; Notable for the following components:   Neutro Abs 8.6 (*)    All other components within normal limits  SARS CORONAVIRUS 2 (HOSPITAL ORDER, [place] [facility])  [place]    EKG None  Radiology Dg Hip Port Unilat With Pelvis 1v Left  Result Date: 02/12/2019 CLINICAL DATA:  Status post attempted reduction of left hip dislocation after fall earlier today EXAM: DG HIP (WITH OR WITHOUT PELVIS) 1V PORT LEFT COMPARISON:  Pelvic and left hip radiographs from earlier today FINDINGS: Status post left total hip arthroplasty, with no evidence of hardware fracture or loosening. Persistent superior dislocation of the left femoral head prosthesis at the left hip joint. No acute osseous fracture. No suspicious focal osseous lesions. Degenerative changes in the visualized lower lumbar spine. IMPRESSION: Persistent superior dislocation of the left femoral head prosthesis at the left hip joint status post left total hip arthroplasty. No acute osseous fracture. Electronically Signed   By: Ilona Sorrel M.D.   On: 02/12/2019 15:27   Dg Hip Unilat W Or Wo Pelvis 2-3 Views Left  Result Date: 02/12/2019  CLINICAL DATA:  Fall in parking lot, pain in left hip and groin. EXAM: DG HIP (WITH OR WITHOUT PELVIS) 2-3V LEFT COMPARISON:  08/31/2018 FINDINGS: Dislocation of the femoral component of the left total hip arthroplasty superior to the acetabulum. Acetabular component remains in place without signs of fracture about the acetabulum. No signs of periprosthetic fracture along femoral component though distal aspect is seen only on the lateral view. IMPRESSION: Superior dislocation of the femoral component of left total hip arthroplasty without signs of fracture though femoral component, distal aspect imaged only on the lateral view. This area could be assessed on post reduction radiographs. Electronically Signed   By: Zetta Bills M.D.   On: 02/12/2019 13:25    Procedures Reduction of dislocation  Date/Time: 02/12/2019 3:00 PM Performed by: Recardo Evangelist, PA-C Authorized by: Wyvonnia Dusky, MD  Consent: Verbal consent obtained. Written consent obtained. Risks and benefits: risks, benefits and alternatives were discussed Consent given by: patient Patient understanding: patient states understanding of the procedure being performed Patient consent: the patient's understanding of the procedure matches consent given Procedure consent: procedure consent matches procedure scheduled Relevant documents: relevant documents present and verified Test results: test results available and properly labeled Site marked: the operative site was marked Imaging studies: imaging studies available Required items: required blood products, implants, devices, and special equipment available Patient identity confirmed: verbally with patient and arm band Time out: Immediately prior to procedure a "time out" was called to verify the correct patient, procedure, equipment, support staff and site/side marked as required. Preparation: Patient was prepped and draped in the usual sterile fashion. Local anesthesia used: no   Anesthesia: Local anesthesia used: no  Sedation: Patient sedated: yes Sedation type: moderate (conscious) sedation Sedatives: propofol Sedation start date/time: 02/12/2019 3:00 PM Sedation end date/time: 02/12/2019 3:15 PM Vitals: Vital signs were monitored during sedation.  Patient tolerance: patient tolerated the procedure well with no immediate complications Comments: Reduction unsuccessful    (including critical care time)  Medications Ordered in ED  Medications  propofol (DIPRIVAN) 10 mg/mL bolus/IV push 70.8 mg (70.8 mg Intravenous Not Given 02/12/19 1452)  morphine 2 MG/ML injection 2 mg (2 mg Intravenous Given 02/12/19 1610)  oxyCODONE-acetaminophen (PERCOCET/ROXICET) 5-325 MG per tablet 1 tablet (1 tablet Oral Given 02/12/19 1248)  propofol (DIPRIVAN) 10 mg/mL bolus/IV push (70.8 mg Intravenous Given 02/12/19 1441)  0.9 %  sodium chloride infusion ( Intravenous New Bag/Given 02/12/19 1609)     Initial Impression / Assessment and Plan / ED Course  I have reviewed the triage vital signs and the nursing notes.  Pertinent labs & imaging results that were available during my care of the patient were reviewed by me and considered in my medical decision making (see chart for details).  78 year old female presents with likely a mechanical fall in the parking lot. [name] hypertensive but otherwise vitals are normal. [name] in significant pain and hasn't been able to ambulate. She has a hx of L hip replacement and this is where she has most of her pain. Hip is rotated and flexed. Will obtain xray of L hip and L knee. Pt does not want imaging of R hand and she is not tender and has normal ROM so will defer imaging of this.   Xray tech called and stated that they could not get imaging of the knee due to her hip pain. They were able to imaging the hip and it is superiorly dislocated. Shared visit with Dr. Langston Masker. Will plan for procedural sedation.   3PM Procedural sedation was performed  and reduction attempted by me and Dr. Langston Masker. We were unsuccessful. Post-reduction films show hip is still out of place. Will discuss with ortho  4PM Discussed with Dr. Sharol Given who would like ED-[facility]ED transfer to [facility]. Discussed with Dr. Zenia Resides who is aware of patient coming. Labs, COVID swab ordered.   Final Clinical Impressions(s) / ED Diagnoses   Final diagnoses:  Hip dislocation, left (HCC)  Closed dislocation of left hip, initial encounter [facility])    ED Discharge Orders    None       Recardo Evangelist, PA-C 02/12/19 1712    Recardo Evangelist, PA-C 02/12/19 1715    Wyvonnia Dusky, MD 02/12/19 1806

## 2019-02-12 NOTE — Sedation Documentation (Addendum)
Pt states she has pain but unable to rate it

## 2019-02-12 NOTE — ED Notes (Signed)
XR at bedside for post procedure XR.

## 2019-02-12 NOTE — Progress Notes (Signed)
Subjective:    Patient ID: Terry Mcneil, female    DOB: 10/02/1940, 78 y.o.   MRN: [id number]  HPI   Patient is a 78 yr old female who presents today for follow up.  HTN- maintained on lisinopril-hctz 20-25. Reports that her home readings were going up so she started back on coreg. Reports that she is taking coreg 6.25 one tab once daily.  BP Readings from Last 3 Encounters:  02/12/19 (!) 151/86  01/15/19 (!) 85/57  07/15/18 (!) 147/70   Overactive Bladder- maintained on detrol LA. Reports symptoms are well controlled.   History of breast cancer- maintained on tamoxifen and continues to follow with Oncology. Dr. Chancy Milroy.    Review of Systems See HPI  Past Medical History:  Diagnosis Date  . Arthritis    back  . Cancer ([place]) 09/2016   left breast cancer  . Cataract   . Colon polyps   . Hypertension   . Osteopenia 10/11/2009  . Patellar fracture    Right Knee   . Urge incontinence      Social History   Socioeconomic History  . Marital status: Married    Spouse name: Brunetta Husby  . Number of children: 2  . Years of education: Not on file  . Highest education level: Not on file  Occupational History  . Occupation: Retired  Scientific laboratory technician  . Financial resource strain: Not on file  . Food insecurity    Worry: Not on file    Inability: Not on file  . Transportation needs    Medical: Not on file    Non-medical: Not on file  Tobacco Use  . Smoking status: Former Smoker    Quit date: 08/27/1978    Years since quitting: 40.4  . Smokeless tobacco: Never Used  Substance and Sexual Activity  . Alcohol use: Yes    Alcohol/week: 7.0 standard drinks    Types: 7 Glasses of wine per week  . Drug use: No  . Sexual activity: Yes  Lifestyle  . Physical activity    Days per week: Not on file    Minutes per session: Not on file  . Stress: Not on file  Relationships  . Social Herbalist on phone: Not on file    Gets together: Not on file    Attends religious  service: Not on file    Active member of club or organization: Not on file    Attends meetings of clubs or organizations: Not on file    Relationship status: Not on file  . Intimate partner violence    Fear of current or ex partner: Not on file    Emotionally abused: Not on file    Physically abused: Not on file    Forced sexual activity: Not on file  Other Topics Concern  . 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     Alcohol Use:  Moderate (Wine), 2 glasses 3 times a week   Drug Use:  None   Diet:  Weight Watchers    Exercise:  Not regular   Hobbies: Reading , Gardening , Grandchildren  Past Surgical History:  Procedure Laterality Date  . BREAST LUMPECTOMY Left 09/2016  . COLONOSCOPY    . EYE SURGERY Bilateral    Cataracts   . ORIF PATELLA Right 08/28/2012   Procedure: OPEN REDUCTION INTERNAL (ORIF) FIXATION PATELLA- RIGHT;  Surgeon: Newt Minion, MD;  Location: [facility];  Service: Orthopedics;  Laterality: Right;  Open Reduction Internal Fixation Right Patella  . RADIOACTIVE SEED GUIDED PARTIAL MASTECTOMY WITH AXILLARY SENTINEL LYMPH NODE BIOPSY Left 10/03/2016   Procedure: LEFT BREAST RADIOACTIVE SEED GUIDED PARTIAL MASTECTOMY WITH LEFT SENTINEL LYMPH NODE MAPPING;  Surgeon: Erroll Luna, MD;  Location: [facility];  Service: General;  Laterality: Left;  . TONSILLECTOMY    . TOTAL HIP ARTHROPLASTY Left 01/13/2018   Procedure: LEFT TOTAL HIP ARTHROPLASTY ANTERIOR APPROACH;  Surgeon: Mcarthur Rossetti, MD;  Location: [facility];  Service: Orthopedics;  Laterality: Left;  . TUBAL LIGATION    . VAGINAL DELIVERY     x2 -     Family History   Problem Relation Age of Onset  . Heart disease Father 77       valve replacement    No Known Allergies  Current Outpatient Medications on File Prior to Visit  Medication Sig Dispense Refill  . atorvastatin (LIPITOR) 20 MG tablet TAKE 1/2 TABLET BY MOUTH ONCE DAILY 45 tablet 1  . calcium-vitamin D (OSCAL WITH D) 500-200 MG-UNIT per tablet Take 1 tablet by mouth daily.     Marland Kitchen lisinopril-hydrochlorothiazide (ZESTORETIC) 20-25 MG tablet TAKE ONE TABLET BY MOUTH DAILY 90 tablet 1  . tamoxifen (NOLVADEX) 20 MG tablet Take 20 mg by mouth daily.     Marland Kitchen tolterodine (DETROL LA) 4 MG 24 hr capsule TAKE ONE CAPSULE BY MOUTH DAILY 90 capsule 1   No current facility-administered medications on file prior to visit.     BP (!) 151/86 (BP Location: Right Arm, Patient Position: Sitting, Cuff Size: Small)   Pulse 89   Temp (!) 96.7 F (35.9 C) (Temporal)   Resp 16   Ht 5\' 6"  (1.676 m)   Wt 156 lb (70.8 kg)   SpO2 96%   BMI 25.18 kg/m       Objective:   Physical Exam Constitutional:      Appearance: [name] well-developed.  Cardiovascular:     Rate and Rhythm: Normal rate and regular rhythm.     Heart sounds: Normal heart sounds. No murmur.  Pulmonary:     Effort: Pulmonary effort is normal. No respiratory distress.     Breath sounds: Normal breath sounds. No wheezing.  Psychiatric:        Behavior: Behavior normal.        Thought Content: Thought content normal.        Judgment: Judgment normal.           Assessment & Plan:  HTN- bp mildly elevated. Recommended the following:  Please change coreg to 1/2 tab twice daily. Check your blood pressure once daily for 1 week and send [place] your readings.   Hx of breast cancer- clinically stable. Management per oncology.  Overactive bladder- stable on Detrol, continue same.

## 2019-02-12 NOTE — ED Triage Notes (Signed)
Pt found outside by two bystanders laying in parking lot of main entrance. Pt tripped and fell, landing on her right hand and left knee. Pt denies LOC and states she does not think she hit her head. Pt c/o left inner hip pain that worsens with movement. Abrasion noted to left knee and palm of right hand

## 2019-02-12 NOTE — Sedation Documentation (Signed)
Pt with shallow respirations. Assisted by Rt with BVM

## 2019-02-13 ENCOUNTER — Observation Stay (HOSPITAL_COMMUNITY): Payer: Medicare Other | Admitting: Anesthesiology

## 2019-02-13 ENCOUNTER — Encounter (HOSPITAL_COMMUNITY)
Admission: EM | Disposition: A | Discharge status: Home / Self Care | Payer: Self-pay | Source: Home / Self Care | Attending: Emergency Medicine

## 2019-02-13 DIAGNOSIS — M479 Spondylosis, unspecified: Secondary | ICD-10-CM | POA: Diagnosis not present

## 2019-02-13 DIAGNOSIS — S73005A Unspecified dislocation of left hip, initial encounter: Secondary | ICD-10-CM | POA: Diagnosis not present

## 2019-02-13 DIAGNOSIS — Z853 Personal history of malignant neoplasm of breast: Secondary | ICD-10-CM | POA: Diagnosis not present

## 2019-02-13 DIAGNOSIS — T84021A Dislocation of internal left hip prosthesis, initial encounter: Secondary | ICD-10-CM | POA: Diagnosis not present

## 2019-02-13 DIAGNOSIS — Z20828 Contact with and (suspected) exposure to other viral communicable diseases: Secondary | ICD-10-CM | POA: Diagnosis not present

## 2019-02-13 HISTORY — PX: HIP CLOSED REDUCTION: SHX983

## 2019-02-13 LAB — SURGICAL PCR SCREEN
MRSA, PCR: NEGATIVE
Staphylococcus aureus: NEGATIVE

## 2019-02-13 SURGERY — CLOSED MANIPULATION, JOINT, HIP
Anesthesia: General | Site: Hip | Laterality: Left

## 2019-02-13 MED ORDER — CHLORHEXIDINE GLUCONATE 4 % EX LIQD: 60.0000 mL | Freq: Once | CUTANEOUS | Status: DC

## 2019-02-13 MED ORDER — LIDOCAINE 2% (20 MG/ML) 5 ML SYRINGE
INTRAMUSCULAR | Status: DC | PRN
  Administered 2019-02-13: 60 mg via INTRAVENOUS

## 2019-02-13 MED ORDER — LACTATED RINGERS IV SOLN
INTRAVENOUS | Status: DC
  Administered 2019-02-13 (×2): via INTRAVENOUS

## 2019-02-13 MED ORDER — FENTANYL CITRATE (PF) 250 MCG/5ML IJ SOLN
INTRAMUSCULAR | Status: AC
  Filled 2019-02-13: qty 5

## 2019-02-13 MED ORDER — FENTANYL CITRATE (PF) 100 MCG/2ML IJ SOLN: 25.0000 ug | INTRAMUSCULAR | Status: DC | PRN

## 2019-02-13 MED ORDER — PROPOFOL 10 MG/ML IV BOLUS
INTRAVENOUS | Status: DC | PRN
  Administered 2019-02-13: 150 mg via INTRAVENOUS

## 2019-02-13 MED ORDER — OXYCODONE-ACETAMINOPHEN 5-325 MG PO TABS: 1.0000 | ORAL_TABLET | ORAL | 0 refills | Status: DC | PRN

## 2019-02-13 MED ORDER — PROMETHAZINE HCL 25 MG/ML IJ SOLN: 6.2500 mg | INTRAMUSCULAR | Status: DC | PRN

## 2019-02-13 NOTE — Anesthesia Postprocedure Evaluation (Signed)
Anesthesia Post Note  Patient: Terry Mcneil  Procedure(s) Performed: CLOSED MANIPULATION OF DISLOCATED HIP (Left Hip)     Patient location during evaluation: PACU Anesthesia Type: General Level of consciousness: awake and alert Pain management: pain level controlled Vital Signs Assessment: post-procedure vital signs reviewed and stable Respiratory status: spontaneous breathing, nonlabored ventilation, respiratory function stable and patient connected to nasal cannula oxygen Cardiovascular status: blood pressure returned to baseline and stable Postop Assessment: no apparent nausea or vomiting Anesthetic complications: no    Last Vitals:  Vitals:   02/13/19 0947 02/13/19 1002  BP: 124/67 129/79  Pulse:  90  Resp:  19  Temp:  (!) 36.3 C  SpO2:  95%    Last Pain:  Vitals:   02/13/19 1002  TempSrc:   PainSc: 0-No pain                 Tiajuana Amass

## 2019-02-13 NOTE — H&P (Signed)
Terry Mcneil is an 78 y.o. female.   Chief Complaint: Dislocated left total hip arthroplasty. HPI: Patient is a 78 year old woman who is 4 weeks status post left total hip arthroplasty.  Patient had a mechanical fall at home sustaining a dislocation of her hip.  Patient was seen at the urgent care center in [facility] attempted reduction was performed this was unsuccessful and patient was transferred to [facility] for reduction of the dislocated left total hip arthroplasty.  Past Medical History:  Diagnosis Date  . Arthritis    back  . Cancer ([place]) 09/2016   left breast cancer  . Cataract   . Colon polyps   . Hypertension   . Osteopenia 10/11/2009  . Patellar fracture    Right Knee   . Urge incontinence     Past Surgical History:  Procedure Laterality Date  . BREAST LUMPECTOMY Left 09/2016  . COLONOSCOPY    . EYE SURGERY Bilateral    Cataracts   . ORIF PATELLA Right 08/28/2012   Procedure: OPEN REDUCTION INTERNAL (ORIF) FIXATION PATELLA- RIGHT;  Surgeon: Newt Minion, MD;  Location: [facility];  Service: Orthopedics;  Laterality: Right;  Open Reduction Internal Fixation Right Patella  . RADIOACTIVE SEED GUIDED PARTIAL MASTECTOMY WITH AXILLARY SENTINEL LYMPH NODE BIOPSY Left 10/03/2016   Procedure: LEFT BREAST RADIOACTIVE SEED GUIDED PARTIAL MASTECTOMY WITH LEFT SENTINEL LYMPH NODE MAPPING;  Surgeon: Erroll Luna, MD;  Location: [facility];  Service: General;  Laterality: Left;  . TONSILLECTOMY    . TOTAL HIP ARTHROPLASTY Left 01/13/2018   Procedure: LEFT TOTAL HIP ARTHROPLASTY ANTERIOR APPROACH;  Surgeon: Mcarthur Rossetti, MD;  Location: [facility];  Service: Orthopedics;  Laterality: Left;  . TUBAL LIGATION    . VAGINAL DELIVERY     x2 -     Family History  Problem Relation Age of Onset  . Heart disease Father 41       valve replacement   Social History:  reports that she quit smoking about 40 years ago. She has never used smokeless tobacco. She reports current  alcohol use of about 7.0 standard drinks of alcohol per week. She reports that she does not use drugs.  Allergies:  Allergies  Allergen Reactions  . Other Other (See Comments)    Sedation = BVM (??) per notes  "Pt with shallow respirations. Assisted by Rt with BVM"    Medications Prior to Admission  Medication Sig Dispense Refill  . atorvastatin (LIPITOR) 20 MG tablet TAKE 1/2 TABLET BY MOUTH ONCE DAILY (Patient taking differently: Take 10 mg by mouth daily. ) 45 tablet 1  . calcium-vitamin D (OSCAL WITH D) 500-200 MG-UNIT per tablet Take 1 tablet by mouth daily.     . carvedilol (COREG) 6.25 MG tablet 1/2 tablet by mouth twice daily (Patient taking differently: Take 3.125 mg by mouth 2 (two) times daily with a meal. ) 60 tablet 3  . lisinopril-hydrochlorothiazide (ZESTORETIC) 20-25 MG tablet TAKE ONE TABLET BY MOUTH DAILY (Patient taking differently: Take 1 tablet by mouth daily. ) 90 tablet 1  . naproxen sodium (ALEVE) 220 MG tablet Take 220-440 mg by mouth 2 (two) times daily as needed (for pain or headaches).    . tamoxifen (NOLVADEX) 20 MG tablet Take 20 mg by mouth daily.     Marland Kitchen tolterodine (DETROL LA) 4 MG 24 hr capsule TAKE ONE CAPSULE BY MOUTH DAILY (Patient taking differently: Take 4 mg by mouth daily. ) 90 capsule 1    Results  for orders placed or performed during the hospital encounter of 02/12/19 (from the past 48 hour(s))  Basic metabolic panel     Status: Abnormal   Collection Time: 02/12/19  4:17 PM  Result Value Ref Range   Sodium 139 135 - 145 mmol/L   Potassium 3.8 3.5 - 5.1 mmol/L   Chloride 105 98 - 111 mmol/L   CO2 23 22 - 32 mmol/L   Glucose, Bld 107 (H) 70 - 99 mg/dL   BUN 15 8 - 23 mg/dL   Creatinine, Ser 0.71 0.44 - 1.00 mg/dL   Calcium 8.8 (L) 8.9 - 10.3 mg/dL   GFR calc non Af Amer >60 >60 mL/min   GFR calc Af Amer >60 >60 mL/min   Anion gap 11 5 - 15    Comment: Performed at [facility], [address] [place], [place] [address]  CBC with  Differential     Status: Abnormal   Collection Time: 02/12/19  4:17 PM  Result Value Ref Range   WBC 9.8 4.0 - 10.5 K/uL   RBC 3.92 3.87 - 5.11 MIL/uL   Hemoglobin 12.7 12.0 - 15.0 g/dL   HCT 38.4 36.0 - 46.0 %   MCV 98.0 80.0 - 100.0 fL   MCH 32.4 26.0 - 34.0 pg   MCHC 33.1 30.0 - 36.0 g/dL   RDW 13.1 11.5 - 15.5 %   Platelets 187 150 - 400 K/uL   nRBC 0.0 0.0 - 0.2 %   Neutrophils Relative % 88 %   Neutro Abs 8.6 (H) 1.7 - 7.7 K/uL   Lymphocytes Relative 7 %   Lymphs Abs 0.7 0.7 - 4.0 K/uL   Monocytes Relative 5 %   Monocytes Absolute 0.5 0.1 - 1.0 K/uL   Eosinophils Relative 0 %   Eosinophils Absolute 0.0 0.0 - 0.5 K/uL   Basophils Relative 0 %   Basophils Absolute 0.0 0.0 - 0.1 K/uL   Immature Granulocytes 0 %   Abs Immature Granulocytes 0.03 0.00 - 0.07 K/uL    Comment: Performed at [facility], [address] [place], [place] [address]  Protime-INR     Status: None   Collection Time: 02/12/19  4:17 PM  Result Value Ref Range   Prothrombin Time 13.7 11.4 - 15.2 seconds   INR 1.1 0.8 - 1.2    Comment: (NOTE) INR goal varies based on device and disease states. Performed at [facility], [address] [place], [place] [address]   SARS Coronavirus 2 [facility] order, Performed in [facility] hospital lab) Nasopharyngeal Nasopharyngeal Swab     Status: None   Collection Time: 02/12/19  4:17 PM   Specimen: Nasopharyngeal Swab  Result Value Ref Range   SARS Coronavirus 2 NEGATIVE NEGATIVE    Comment: (NOTE) If result is NEGATIVE SARS-CoV-2 target nucleic acids are NOT DETECTED. The SARS-CoV-2 RNA is generally detectable in upper and lower  respiratory specimens during the acute phase of infection. The lowest  concentration of SARS-CoV-2 viral copies this assay can detect is 250  copies / mL. A negative result does not preclude SARS-CoV-2 infection  and should not be used as the sole basis for treatment or other  patient management decisions.  A  negative result may occur with  improper specimen collection / handling, submission of specimen other  than nasopharyngeal swab, presence of viral mutation(s) within the  areas targeted by this assay, and inadequate number of viral copies  (<250  copies / mL). A negative result must be combined with clinical  observations, patient history, and epidemiological information. If result is POSITIVE SARS-CoV-2 target nucleic acids are DETECTED. The SARS-CoV-2 RNA is generally detectable in upper and lower  respiratory specimens dur ing the acute phase of infection.  Positive  results are indicative of active infection with SARS-CoV-2.  Clinical  correlation with patient history and other diagnostic information is  necessary to determine patient infection status.  Positive results do  not rule out bacterial infection or co-infection with other viruses. If result is PRESUMPTIVE POSTIVE SARS-CoV-2 nucleic acids MAY BE PRESENT.   A presumptive positive result was obtained on the submitted specimen  and confirmed on repeat testing.  While 2019 novel coronavirus  (SARS-CoV-2) nucleic acids may be present in the submitted sample  additional confirmatory testing may be necessary for epidemiological  and / or clinical management purposes  to differentiate between  SARS-CoV-2 and other Sarbecovirus currently known to infect humans.  If clinically indicated additional testing with an alternate test  methodology [phone]) is advised. The SARS-CoV-2 RNA is generally  detectable in upper and lower respiratory sp ecimens during the acute  phase of infection. The expected result is Negative. Fact Sheet for Patients:  StrictlyIdeas.no Fact Sheet for Healthcare Providers: BankingDealers.co.za This test is not yet approved or cleared by the [place] FDA and has been authorized for detection and/or diagnosis of SARS-CoV-2 by FDA under an Emergency Use  Authorization (EUA).  This EUA will remain in effect (meaning this test can be used) for the duration of the COVID-19 declaration under Section 564(b)(1) of the Act, 21 U.S.C. section 360bbb-3(b)(1), unless the authorization is terminated or revoked sooner. Performed at [facility], [address], [place], [place] [address]   Surgical pcr screen     Status: None   Collection Time: 02/13/19  2:36 AM   Specimen: Nasal Mucosa; Nasal Swab  Result Value Ref Range   MRSA, PCR NEGATIVE NEGATIVE   Staphylococcus aureus NEGATIVE NEGATIVE    Comment: (NOTE) The Xpert SA Assay (FDA approved for NASAL specimens in patients 14 years of age and older), is one component of a comprehensive surveillance program. It is not intended to diagnose infection nor to guide or monitor treatment. Performed at [facility], [place] [address], [place], [place] [address]    Dg Hip Port Unilat With Pelvis 1v Left  Result Date: 02/12/2019 CLINICAL DATA:  Status post attempted reduction of left hip dislocation after fall earlier today EXAM: DG HIP (WITH OR WITHOUT PELVIS) 1V PORT LEFT COMPARISON:  Pelvic and left hip radiographs from earlier today FINDINGS: Status post left total hip arthroplasty, with no evidence of hardware fracture or loosening. Persistent superior dislocation of the left femoral head prosthesis at the left hip joint. No acute osseous fracture. No suspicious focal osseous lesions. Degenerative changes in the visualized lower lumbar spine. IMPRESSION: Persistent superior dislocation of the left femoral head prosthesis at the left hip joint status post left total hip arthroplasty. No acute osseous fracture. Electronically Signed   By: Ilona Sorrel M.D.   On: 02/12/2019 15:27   Dg Hip Unilat W Or Wo Pelvis 2-3 Views Left  Result Date: 02/12/2019 CLINICAL DATA:  Fall in parking lot, pain in left hip and groin. EXAM: DG HIP (WITH OR WITHOUT PELVIS) 2-3V LEFT COMPARISON:  08/31/2018  FINDINGS: Dislocation of the femoral component of the left total hip arthroplasty superior to the acetabulum. Acetabular component remains  in place without signs of fracture about the acetabulum. No signs of periprosthetic fracture along femoral component though distal aspect is seen only on the lateral view. IMPRESSION: Superior dislocation of the femoral component of left total hip arthroplasty without signs of fracture though femoral component, distal aspect imaged only on the lateral view. This area could be assessed on post reduction radiographs. Electronically Signed   By: Zetta Bills M.D.   On: 02/12/2019 13:25    Review of Systems  All other systems reviewed and are negative.   Blood pressure 137/83, pulse 83, temperature 97.6 F (36.4 C), temperature source Oral, resp. rate 16, height 5\' 6"  (1.676 m), weight 70.8 kg, SpO2 94 %. Physical Exam  Examination patient has a shortened externally rotated left lower extremity her leg is neurovascular intact radiograph shows a dislocation of the left total hip arthroplasty without fractures.  Patient is alert oriented no adenopathy well-dressed normal affect normal respiratory effort no other complaints no other injuries from her fall patient sustained a mechanical fall without syncopal event Assessment/Plan Assessment: Dislocated left total hip arthroplasty without fracture.  Plan: We will plan for close reduction left hip under general anesthesia.  Risk and benefits were discussed including potential for need for open reduction.  Patient states she understands wished to proceed at this time.  Newt Minion, MD 02/13/2019, 6:39 AM

## 2019-02-13 NOTE — Anesthesia Procedure Notes (Signed)
Procedure Name: General with mask airway Date/Time: 02/13/2019 9:18 AM Performed by: Leonor Liv, CRNA Oxygen Delivery Method: Circle system utilized Preoxygenation: Pre-oxygenation with 100% oxygen Induction Type: IV induction Ventilation: Mask ventilation without difficulty Placement Confirmation: positive ETCO2 Dental Injury: Teeth and Oropharynx as per pre-operative assessment

## 2019-02-13 NOTE — Op Note (Signed)
02/13/2019  9:29 AM  PATIENT:  [name] DIAGNOSIS:  DISLOCATED LEFT TOTAL HIP  POST-OPERATIVE DIAGNOSIS:  Same  PROCEDURE:  CLOSED REDUCTION OF DISLOCATED LEFT TOTAL HIP  SURGEON:  Newt Minion, MD  PHYSICIAN ASSISTANT:None ANESTHESIA:   General  PREOPERATIVE INDICATIONS:  Terry Mcneil is a  78 y.o. female with a diagnosis of DISLOCATED LEFT HIP who failed conservative measures and elected for surgical management.    The risks benefits and alternatives were discussed with the patient preoperatively including but not limited to the risks of infection, bleeding, nerve injury, cardiopulmonary complications, the need for revision surgery, among others, and the patient was willing to proceed.  OPERATIVE IMPLANTS: None  @ENCIMAGES @  OPERATIVE FINDINGS: After close reduction patient's alignment and rotation of her left lower extremity was restored she had good passive range of motion of the hip.  OPERATIVE PROCEDURE: Patient underwent general anesthesia after a timeout.  After adequate levels of anesthesia were obtained patient's left lower extremity hip dislocation was reduced without complications.  Patient had good range of motion the hip after reduction the leg length was restored rotation was restored.  Patient was taken to the PACU in stable condition   DISCHARGE PLANNING:  Antibiotic duration: Not applicable  Weightbearing: Weightbearing as tolerated  Pain medication:Prescription for Percocet sent to the pharmacy  Dressing care/ Wound VAC: Not applicable  Ambulatory devices: Walker  Discharge to: Discharge to home after therapy.  Follow-up: In the office 1 week post operative.

## 2019-02-13 NOTE — Anesthesia Preprocedure Evaluation (Addendum)
Anesthesia Evaluation  Patient identified by MRN, date of birth, ID band Patient awake    Reviewed: Allergy & Precautions, NPO status , Patient's Chart, lab work & pertinent test results  Airway Mallampati: II  TM Distance: >3 FB Neck ROM: Full    Dental  (+) Teeth Intact, Dental Advisory Given   Pulmonary former smoker,    breath sounds clear to auscultation       Cardiovascular hypertension, Pt. on medications and Pt. on home beta blockers  Rhythm:Regular Rate:Normal     Neuro/Psych negative neurological ROS     GI/Hepatic negative GI ROS, Neg liver ROS,   Endo/Other  negative endocrine ROS  Renal/GU negative Renal ROS     Musculoskeletal  (+) Arthritis ,   Abdominal   Peds  Hematology negative hematology ROS (+)   Anesthesia Other Findings   Reproductive/Obstetrics                             Anesthesia Physical  Anesthesia Plan  ASA: III  Anesthesia Plan: General   Post-op Pain Management:    Induction: Intravenous  PONV Risk Score and Plan: 3 and Treatment may vary due to age or medical condition  Airway Management Planned: Mask and Natural Airway  Additional Equipment:   Intra-op Plan:   Post-operative Plan: Extubation in OR  Informed Consent: I have reviewed the patients History and Physical, chart, labs and discussed the procedure including the risks, benefits and alternatives for the proposed anesthesia with the patient or authorized representative who has indicated his/her understanding and acceptance.     Dental advisory given  Plan Discussed with: CRNA, Anesthesiologist and Surgeon  Anesthesia Plan Comments:        Anesthesia Quick Evaluation

## 2019-02-13 NOTE — Transfer of Care (Signed)
Immediate Anesthesia Transfer of Care Note  Patient: Terry Mcneil  Procedure(s) Performed: CLOSED MANIPULATION OF DISLOCATED HIP (Left Hip)  Patient Location: PACU  Anesthesia Type:General  Level of Consciousness: awake, alert  and oriented  Airway & Oxygen Therapy: Patient Spontanous Breathing and Patient connected to nasal cannula oxygen  Post-op Assessment: Report given to RN and Post -op Vital signs reviewed and stable  Post vital signs: Reviewed and stable  Last Vitals:  Vitals Value Taken Time  BP    Temp    Pulse    Resp    SpO2      Last Pain:  Vitals:   02/13/19 0733  TempSrc: Oral  PainSc:       Patients Stated Pain Goal: 0 ([id number])  Complications: No apparent anesthesia complications

## 2019-02-13 NOTE — Progress Notes (Signed)
Terry Mcneil to be D/C'd per MD order. Discussed with the patient and all questions fully answered. ? VSS, Skin clean, dry and intact without evidence of skin break down, no evidence of skin tears noted. ? IV catheter discontinued intact. Site without signs and symptoms of complications. Dressing and pressure applied. ? An After Visit Summary was printed and given to the patient. Patient informed where to pickup prescriptions. ? D/c education completed with patient/family including follow up instructions, medication list, d/c activities limitations if indicated, with other d/c instructions as indicated by MD - patient able to verbalize understanding, all questions fully answered.  ? Patient instructed to return to ED, call 911, or call MD for any changes in condition.  ? Patient to be escorted via Terry Mcneil, and D/C home via private auto.

## 2019-02-13 NOTE — ED Provider Notes (Signed)
  Physical Exam  BP (!) 143/83   Pulse 81   Temp 97.6 F (36.4 C) (Oral)   Resp 15   Ht 5\' 6"  (1.676 m)   Wt 70.8 kg   SpO2 94%   BMI 25.18 kg/m   Physical Exam   Gen: appears nontoxic MSK: Left leg is shortened and internally rotated.  Pedal pulse intact.  ED Course/Procedures    Procedures  MDM   Patient transferred from [facility] after a mechanical fall causing a left hip dislocation.  In brief, patient fell earlier today, injuring her left hip.  She denies hitting her head or loss of consciousness.  Hip was found to be dislocated, attempted conscious sedation with propofol during which patient was sedated but reduction was unsuccessful.  Patient with a history of left hip replacement.  Case was discussed with Dr. due to, who recommended transfer to [facility] for Ortho evaluation.  Upon arrival to the [facility], patient remained neurovascularly intact.  Reported continued pain, although improved slightly after some morphine.  Will consult with Dr. Sharol Given to alert him she is in the department.  Discussed with Dr. Sharol Given who feels patient will need to go to the OR for reduction.  However, OR is busy tonight, therefore he recommends admission to the orthopedic service with plan for OR tomorrow.  Recommends n.p.o., saline lock at 75 an hour, and PRN pain medication.      Franchot Heidelberg, PA-C 02/13/19 0030    Malvin Johns, MD 02/26/19 1521

## 2019-02-14 ENCOUNTER — Encounter (HOSPITAL_COMMUNITY): Payer: Self-pay | Admitting: Orthopedic Surgery

## 2019-02-14 NOTE — Discharge Summary (Signed)
Discharge Diagnoses:  Active Problems:   Hip dislocation, left (HCC)   Closed dislocation of left hip ([place])   Surgeries: Procedure(s): CLOSED MANIPULATION OF DISLOCATED HIP on 02/13/2019    Consultants:   Discharged Condition: Improved  Hospital Course: Terry Mcneil is an 78 y.o. female who was admitted 02/12/2019 with a chief complaint of dislocated left total hip arthroplasty, with a final diagnosis of DISLOCATED LEFT HIP.  Patient was brought to the operating room on 02/13/2019 and underwent Procedure(s): [place].    Patient was given perioperative antibiotics:  Anti-infectives (From admission, onward)   None    .  Patient was given sequential compression devices, early ambulation, and aspirin for DVT prophylaxis.  Recent vital signs:  Patient Vitals for the past 24 hrs:  BP Temp Pulse Resp SpO2  02/13/19 1002 129/79 (!) 97.3 F (36.3 C) 90 19 95 %  02/13/19 0947 124/67 - - - -  02/13/19 0933 (!) 100/59 (!) 97 F (36.1 C) 94 17 95 %  .  Recent laboratory studies: Dg Hip Port Unilat With Pelvis 1v Left  Result Date: 02/12/2019 CLINICAL DATA:  Status post attempted reduction of left hip dislocation after fall earlier today EXAM: DG HIP (WITH OR WITHOUT PELVIS) 1V PORT LEFT COMPARISON:  Pelvic and left hip radiographs from earlier today FINDINGS: Status post left total hip arthroplasty, with no evidence of hardware fracture or loosening. Persistent superior dislocation of the left femoral head prosthesis at the left hip joint. No acute osseous fracture. No suspicious focal osseous lesions. Degenerative changes in the visualized lower lumbar spine. IMPRESSION: Persistent superior dislocation of the left femoral head prosthesis at the left hip joint status post left total hip arthroplasty. No acute osseous fracture. Electronically Signed   By: Ilona Sorrel M.D.   On: 02/12/2019 15:27   Dg Hip Unilat W Or Wo Pelvis 2-3 Views Left  Result Date:  02/12/2019 CLINICAL DATA:  Fall in parking lot, pain in left hip and groin. EXAM: DG HIP (WITH OR WITHOUT PELVIS) 2-3V LEFT COMPARISON:  08/31/2018 FINDINGS: Dislocation of the femoral component of the left total hip arthroplasty superior to the acetabulum. Acetabular component remains in place without signs of fracture about the acetabulum. No signs of periprosthetic fracture along femoral component though distal aspect is seen only on the lateral view. IMPRESSION: Superior dislocation of the femoral component of left total hip arthroplasty without signs of fracture though femoral component, distal aspect imaged only on the lateral view. This area could be assessed on post reduction radiographs. Electronically Signed   By: Zetta Bills M.D.   On: 02/12/2019 13:25    Discharge Medications:   Allergies as of 02/13/2019      Reactions   Other Other (See Comments)   Sedation = BVM (??) per notes  "Pt with shallow respirations. Assisted by Rt with BVM"      Medication List    TAKE these medications   atorvastatin 20 MG tablet Commonly known as: LIPITOR TAKE 1/2 TABLET BY MOUTH ONCE DAILY   calcium-vitamin D 500-200 MG-UNIT tablet Commonly known as: OSCAL WITH D Take 1 tablet by mouth daily.   carvedilol 6.25 MG tablet Commonly known as: COREG 1/2 tablet by mouth twice daily What changed:   how much to take  how to take this  when to take this  additional instructions   lisinopril-hydrochlorothiazide 20-25 MG tablet Commonly known as: ZESTORETIC TAKE ONE TABLET BY MOUTH DAILY   naproxen sodium  220 MG tablet Commonly known as: ALEVE Take 220-440 mg by mouth 2 (two) times daily as needed (for pain or headaches).   oxyCODONE-acetaminophen 5-325 MG tablet Commonly known as: PERCOCET/ROXICET Take 1 tablet by mouth every 4 (four) hours as needed.   tamoxifen 20 MG tablet Commonly known as: NOLVADEX Take 20 mg by mouth daily.   tolterodine 4 MG 24 hr capsule Commonly known  as: DETROL LA TAKE ONE CAPSULE BY MOUTH DAILY What changed:   how much to take  how to take this  when to take this  additional instructions       Diagnostic Studies: Dg Hip Port Unilat With Pelvis 1v Left  Result Date: 02/12/2019 CLINICAL DATA:  Status post attempted reduction of left hip dislocation after fall earlier today EXAM: DG HIP (WITH OR WITHOUT PELVIS) 1V PORT LEFT COMPARISON:  Pelvic and left hip radiographs from earlier today FINDINGS: Status post left total hip arthroplasty, with no evidence of hardware fracture or loosening. Persistent superior dislocation of the left femoral head prosthesis at the left hip joint. No acute osseous fracture. No suspicious focal osseous lesions. Degenerative changes in the visualized lower lumbar spine. IMPRESSION: Persistent superior dislocation of the left femoral head prosthesis at the left hip joint status post left total hip arthroplasty. No acute osseous fracture. Electronically Signed   By: Ilona Sorrel M.D.   On: 02/12/2019 15:27   Dg Hip Unilat W Or Wo Pelvis 2-3 Views Left  Result Date: 02/12/2019 CLINICAL DATA:  Fall in parking lot, pain in left hip and groin. EXAM: DG HIP (WITH OR WITHOUT PELVIS) 2-3V LEFT COMPARISON:  08/31/2018 FINDINGS: Dislocation of the femoral component of the left total hip arthroplasty superior to the acetabulum. Acetabular component remains in place without signs of fracture about the acetabulum. No signs of periprosthetic fracture along femoral component though distal aspect is seen only on the lateral view. IMPRESSION: Superior dislocation of the femoral component of left total hip arthroplasty without signs of fracture though femoral component, distal aspect imaged only on the lateral view. This area could be assessed on post reduction radiographs. Electronically Signed   By: Zetta Bills M.D.   On: 02/12/2019 13:25    Patient benefited maximally from their hospital stay and there were no complications.      Disposition:  Discharge Instructions    Call MD / Call 911   Complete by: As directed    If you experience chest pain or shortness of breath, CALL 911 and be transported to the hospital emergency room.  If you develope a fever above 101 F, pus (white drainage) or increased drainage or redness at the wound, or calf pain, call your surgeon's office.   Constipation Prevention   Complete by: As directed    Drink plenty of fluids.  Prune juice may be helpful.  You may use a stool softener, such as Colace (over the counter) 100 mg twice a day.  Use MiraLax (over the counter) for constipation as needed.   Diet - low sodium heart healthy   Complete by: As directed    Increase activity slowly as tolerated   Complete by: As directed      Follow-up Information    Mcarthur Rossetti, MD In 1 week.   Specialty: Orthopedic Surgery Contact information: [place] [address] [phone]            Signed: Newt Minion 02/14/2019, 8:30 AM

## 2019-03-01 ENCOUNTER — Other Ambulatory Visit: Payer: Self-pay | Admitting: Family

## 2019-03-01 ENCOUNTER — Other Ambulatory Visit: Payer: Self-pay | Admitting: Emergency Medicine

## 2019-03-01 DIAGNOSIS — Z853 Personal history of malignant neoplasm of breast: Secondary | ICD-10-CM

## 2019-03-02 ENCOUNTER — Other Ambulatory Visit: Payer: Self-pay | Admitting: Family

## 2019-05-18 ENCOUNTER — Ambulatory Visit: Payer: Medicare Other | Admitting: Family

## 2019-05-29 ENCOUNTER — Other Ambulatory Visit: Payer: Self-pay | Admitting: Family

## 2019-06-23 ENCOUNTER — Telehealth: Payer: Self-pay | Admitting: Family

## 2019-06-23 NOTE — Telephone Encounter (Signed)
Form in provider's folder

## 2019-06-23 NOTE — Telephone Encounter (Signed)
Form not mailed yet, lvm for patient's daughter Lattie Haw to be aware form is ready and to let me know if she will like to pick up or mail.

## 2019-06-23 NOTE — Telephone Encounter (Signed)
Caller Name: Armilla Wedemeier, daughter-in-law Phone: [phone]  Lattie Haw called stating pt needing parking placard and asking to email. I have printed out and completed the patient portion and placed in the bin for Melissa. Please complete and notify Lattie Haw to come pick up.

## 2019-06-23 NOTE — Telephone Encounter (Signed)
Form filled out and mailed to patient.

## 2019-06-24 ENCOUNTER — Telehealth: Payer: Self-pay | Admitting: Family

## 2019-06-24 NOTE — Telephone Encounter (Signed)
Form will be mailed to requested address

## 2019-06-24 NOTE — Telephone Encounter (Signed)
Spoke with Illene Silver patients [name], she will like form to be mailed to [place], [address]

## 2019-07-26 LAB — HM MAMMOGRAPHY

## 2019-07-28 ENCOUNTER — Other Ambulatory Visit: Payer: Self-pay | Admitting: Family

## 2019-08-23 ENCOUNTER — Other Ambulatory Visit: Payer: Self-pay

## 2019-08-24 ENCOUNTER — Encounter: Payer: Self-pay | Admitting: Family

## 2019-08-24 ENCOUNTER — Other Ambulatory Visit: Payer: Self-pay

## 2019-08-24 ENCOUNTER — Ambulatory Visit: Payer: Medicare PPO | Admitting: Family

## 2019-08-24 VITALS — BP 99/72 | HR 90 | Temp 97.0°F | Resp 16 | Ht 66.5 in | Wt 143.0 lb

## 2019-08-24 DIAGNOSIS — N3281 Overactive bladder: Secondary | ICD-10-CM

## 2019-08-24 DIAGNOSIS — I10 Essential (primary) hypertension: Secondary | ICD-10-CM | POA: Diagnosis not present

## 2019-08-24 DIAGNOSIS — E785 Hyperlipidemia, unspecified: Secondary | ICD-10-CM | POA: Diagnosis not present

## 2019-08-24 DIAGNOSIS — C50919 Malignant neoplasm of unspecified site of unspecified female breast: Secondary | ICD-10-CM | POA: Diagnosis not present

## 2019-08-24 LAB — COMPREHENSIVE METABOLIC PANEL
ALT: 10 U/L (ref 0–35)
AST: 17 U/L (ref 0–37)
Albumin: 4.1 g/dL (ref 3.5–5.2)
Alkaline Phosphatase: 59 U/L (ref 39–117)
BUN: 34 mg/dL — ABNORMAL HIGH (ref 6–23)
CO2: 24 mEq/L (ref 19–32)
Calcium: 10.2 mg/dL (ref 8.4–10.5)
Chloride: 98 mEq/L (ref 96–112)
Creatinine, Ser: 1.04 mg/dL (ref 0.40–1.20)
GFR: 51.03 mL/min — ABNORMAL LOW (ref >60.00)
Glucose, Bld: 107 mg/dL — ABNORMAL HIGH (ref 70–99)
Potassium: 4.1 mEq/L (ref 3.5–5.1)
Sodium: 134 mEq/L — ABNORMAL LOW (ref 135–145)
Total Bilirubin: 0.5 mg/dL (ref 0.2–1.2)
Total Protein: 6.1 g/dL (ref 6.0–8.3)

## 2019-08-24 LAB — LIPID PANEL
Cholesterol: 165 mg/dL (ref 0–200)
HDL: 48.3 mg/dL (ref >39.00)
NonHDL: 116.98
Total CHOL/HDL Ratio: 3
Triglycerides: 207 mg/dL — ABNORMAL HIGH (ref 0.0–149.0)
VLDL: 41.4 mg/dL — ABNORMAL HIGH (ref 0.0–40.0)

## 2019-08-24 LAB — LDL CHOLESTEROL, DIRECT: Direct LDL: 78 mg/dL

## 2019-08-24 NOTE — Progress Notes (Signed)
Subjective:    Patient ID: Terry Mcneil, female    DOB: Mar 12, 1941, 79 y.o.   MRN: [id number]  HPI  Patient is a 79 yr old female who presents today for follow up.  HTN- maintained on zestoretic/carvedilol . Reports bp at home 2 days ago was 135/80. Reports that she took bp meds right before coming in.  BP Readings from Last 3 Encounters:  08/24/19 99/72  02/13/19 129/79  02/12/19 (!) 151/86   Hyperlipidemia- maintained on lipitor. Lab Results  Component Value Date   CHOL 171 07/15/2018   HDL 69.80 07/15/2018   LDLCALC 82 07/15/2018   TRIG 94.0 07/15/2018   CHOLHDL 2 07/15/2018   Breast CA- following with oncology (Dr. Chancy Milroy). Recently had 3 year follow up appointment.    OAB- maintained on detrol LA. Reports symptoms are stable.    Wt Readings from Last 3 Encounters:  08/24/19 143 lb (64.9 kg)  02/12/19 156 lb (70.8 kg)  02/12/19 156 lb (70.8 kg)   Reports that her husband was recently diagnosed with Alzheimers. She reports that this has been really hard on her.   Review of Systems See HPI  Past Medical History:  Diagnosis Date  . Arthritis    back  . Cancer ([place]) 09/2016   left breast cancer  . Cataract   . Colon polyps   . Hypertension   . Osteopenia 10/11/2009  . Patellar fracture    Right Knee   . Urge incontinence      Social History   Socioeconomic History  . Marital status: Married    Spouse name: Kannon Garvey  . Number of children: 2  . Years of education: Not on file  . Highest education level: Not on file  Occupational History  . Occupation: Retired  Tobacco Use  . Smoking status: Former Smoker    Quit date: 08/27/1978    Years since quitting: 41.0  . Smokeless tobacco: Never Used  Substance and Sexual Activity  . Alcohol use: Yes    Alcohol/week: 7.0 standard drinks    Types: 7 Glasses of wine per week  . Drug use: No  . Sexual activity: Yes  Other Topics Concern  . 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     Alcohol Use:  Moderate (Wine), 2 glasses 3 times a week   Drug Use:  None   Diet:  Weight Watchers    Exercise:  Not regular   Hobbies: Reading , Gardening , Grandchildren               Social Determinants of Health   Financial Resource Strain:   . Difficulty of Paying Living Expenses:   Food Insecurity:   . Worried About Charity fundraiser in the Last Year:   . Arboriculturist in the Last Year:   Transportation Needs:   . Film/video editor (Medical):   Marland Kitchen Lack of Transportation (Non-Medical):   Physical Activity:   . Days of Exercise per Week:   . Minutes of Exercise per Session:   Stress:   . Feeling of Stress :   Social Connections:   .  Frequency of Communication with Friends and Family:   . Frequency of Social Gatherings with Friends and Family:   . Attends Religious Services:   . Active Member of Clubs or Organizations:   . Attends Archivist Meetings:   Marland Kitchen Marital Status:   Intimate Partner Violence:   . Fear of Current or Ex-Partner:   . Emotionally Abused:   Marland Kitchen Physically Abused:   . Sexually Abused:     Past Surgical History:  Procedure Laterality Date  . BREAST LUMPECTOMY Left 09/2016  . COLONOSCOPY    . EYE SURGERY Bilateral    Cataracts   . HIP CLOSED REDUCTION Left 02/13/2019   Procedure: CLOSED MANIPULATION OF DISLOCATED HIP;  Surgeon: Newt Minion, MD;  Location: [place];  Service: Orthopedics;  Laterality: Left;  . ORIF PATELLA Right 08/28/2012   Procedure: OPEN REDUCTION INTERNAL (ORIF) FIXATION PATELLA- RIGHT;  Surgeon: Newt Minion, MD;  Location: [place];  Service: Orthopedics;  Laterality: Right;  Open Reduction Internal Fixation Right [place]  .  RADIOACTIVE SEED GUIDED PARTIAL MASTECTOMY WITH AXILLARY SENTINEL LYMPH NODE BIOPSY Left 10/03/2016   Procedure: LEFT BREAST RADIOACTIVE SEED GUIDED PARTIAL MASTECTOMY WITH LEFT SENTINEL LYMPH NODE MAPPING;  Surgeon: Erroll Luna, MD;  Location: [place];  Service: General;  Laterality: Left;  . TONSILLECTOMY    . TOTAL HIP ARTHROPLASTY Left 01/13/2018   Procedure: LEFT TOTAL HIP ARTHROPLASTY ANTERIOR APPROACH;  Surgeon: Mcarthur Rossetti, MD;  Location: [place];  Service: Orthopedics;  Laterality: Left;  . TUBAL LIGATION    . VAGINAL DELIVERY     x2 -     Family History  Problem Relation Age of Onset  . Heart disease Father 15       valve replacement    Allergies  Allergen Reactions  . Other Other (See Comments)    Sedation = BVM (??) per notes  "Pt with shallow respirations. Assisted by Rt with BVM"    Current Outpatient Medications on File Prior to Visit  Medication Sig Dispense Refill  . atorvastatin (LIPITOR) 20 MG tablet TAKE 1/2 TABLET BY MOUTH ONCE DAILY (Patient taking differently: Take 10 mg by mouth daily. ) 45 tablet 1  . calcium-vitamin D (OSCAL WITH D) 500-200 MG-UNIT per tablet Take 1 tablet by mouth daily.     . carvedilol (COREG) 6.25 MG tablet 1/2 tablet by mouth twice daily (Patient taking differently: Take 3.125 mg by mouth 2 (two) times daily with a meal. ) 60 tablet 3  . lisinopril-hydrochlorothiazide (ZESTORETIC) 20-25 MG tablet TAKE ONE TABLET BY MOUTH DAILY 90 tablet 0  . naproxen sodium (ALEVE) 220 MG tablet Take 220-440 mg by mouth 2 (two) times daily as needed (for pain or headaches).    . oxyCODONE-acetaminophen (PERCOCET/ROXICET) 5-325 MG tablet Take 1 tablet by mouth every 4 (four) hours as needed. 20 tablet 0  . tamoxifen (NOLVADEX) 20 MG tablet Take 20 mg by mouth daily.     Marland Kitchen tolterodine (DETROL LA) 4 MG 24 hr capsule TAKE ONE CAPSULE BY MOUTH DAILY 75 capsule 0   No current facility-administered medications on file prior to  visit.    BP 99/72 (BP Location: Right Arm, Patient Position: Sitting, Cuff Size: Small)   Pulse 90   Temp (!) 97 F (36.1 C) (Temporal)   Resp 16   Ht 5' 6.5" (1.689 m)   Wt 143 lb (64.9 kg)   SpO2 98%   BMI 22.74 kg/m  Objective:   Physical Exam Constitutional:      Appearance: [name] well-developed.  Neck:     Thyroid: No thyromegaly.  Cardiovascular:     Rate and Rhythm: Normal rate and regular rhythm.     Heart sounds: Normal heart sounds. No murmur.  Pulmonary:     Effort: Pulmonary effort is normal. No respiratory distress.     Breath sounds: Normal breath sounds. No wheezing.  Musculoskeletal:     Cervical back: Neck supple.  Skin:    General: Skin is warm and dry.  Neurological:     Mental Status: [name] alert and oriented to person, place, and time.  Psychiatric:        Behavior: Behavior normal.        Thought Content: Thought content normal.        Judgment: Judgment normal.           Assessment & Plan:  HTN- bp appears overtreated.  Will d/c carvedilol. Continue lisinopril hctz. Pt to check bp once daily x 1 week at home and call me with her readings. Will plan to bring back to the office in 1 month. Check CMET.   Hyperlipidemia- tolerating statin. Obtain follow up lipid panel.   OAB- stable on detrol, continue same.   Hx breast CA- negative mammogram 07/26/19- continue surveillance with oncology.

## 2019-08-24 NOTE — Patient Instructions (Signed)
Please stop carvedilol. Check blood pressure once daily for 1 week, then call me with results.

## 2019-08-25 ENCOUNTER — Encounter: Payer: Self-pay | Admitting: Family

## 2019-08-26 NOTE — Progress Notes (Signed)
Mailed out to pt 

## 2019-08-27 ENCOUNTER — Other Ambulatory Visit: Payer: Self-pay | Admitting: Family

## 2019-09-14 ENCOUNTER — Other Ambulatory Visit: Payer: Self-pay | Admitting: Family

## 2019-09-15 ENCOUNTER — Telehealth: Payer: Self-pay | Admitting: Orthopaedic Surgery

## 2019-09-15 NOTE — Telephone Encounter (Signed)
Patient aware I have faxed to provided number

## 2019-09-15 NOTE — Telephone Encounter (Signed)
Patient called stating that she is having a dental procedure on Monday, May 10th.  The dentist (Dr. Theda Belfast) is wanting to know what Dr. Ninfa Linden recommends she takes and how many before her procedure.  Dr. Theda Belfast is wanting a RX faxed to his office at [phone].  Patient's CB#[phone].  Thank you.

## 2019-09-21 ENCOUNTER — Ambulatory Visit: Payer: Medicare PPO | Admitting: Family

## 2019-10-01 ENCOUNTER — Encounter: Payer: Self-pay | Admitting: Family

## 2019-10-01 ENCOUNTER — Ambulatory Visit: Payer: Medicare PPO | Admitting: Family

## 2019-10-01 ENCOUNTER — Other Ambulatory Visit: Payer: Self-pay

## 2019-10-01 VITALS — BP 116/64 | HR 93 | Temp 97.1°F | Resp 16 | Ht 66.5 in | Wt 141.0 lb

## 2019-10-01 DIAGNOSIS — N3281 Overactive bladder: Secondary | ICD-10-CM | POA: Diagnosis not present

## 2019-10-01 DIAGNOSIS — E785 Hyperlipidemia, unspecified: Secondary | ICD-10-CM | POA: Diagnosis not present

## 2019-10-01 DIAGNOSIS — I10 Essential (primary) hypertension: Secondary | ICD-10-CM | POA: Diagnosis not present

## 2019-10-01 NOTE — Patient Instructions (Signed)
Please complete lab work prior to leaving.   

## 2019-10-01 NOTE — Progress Notes (Signed)
Subjective:    Patient ID: Terry Mcneil, female    DOB: [birth year], 79 y.o.   MRN: [id number]  HPI   Patient is a 79 yr old female who presents today for follow up.  HTN- on lisinopril-hctz.  BP Readings from Last 3 Encounters:  10/01/19 116/64  08/24/19 99/72  02/13/19 129/79   Hyperlipidemia- on atorvastatin.   Lab Results  Component Value Date   CHOL 165 08/24/2019   HDL 48.30 08/24/2019   LDLCALC 82 07/15/2018   LDLDIRECT 78.0 08/24/2019   TRIG 207.0 (H) 08/24/2019   CHOLHDL 3 08/24/2019   OAB- maintained on detrol LA.    Review of Systems Past Medical History:  Diagnosis Date  . Arthritis    back  . Cancer ([place]) 09/2016   left breast cancer  . Cataract   . Colon polyps   . Hypertension   . Osteopenia 10/11/2009  . Patellar fracture    Right Knee   . Urge incontinence      Social History   Socioeconomic History  . Marital status: Married    Spouse name: Vada Lartigue  . Number of children: 2  . Years of education: Not on file  . Highest education level: Not on file  Occupational History  . Occupation: Retired  Tobacco Use  . Smoking status: Former Smoker    Quit date: 08/27/1978    Years since quitting: 41.1  . Smokeless tobacco: Never Used  Substance and Sexual Activity  . Alcohol use: Yes    Alcohol/week: 7.0 standard drinks    Types: 7 Glasses of wine per week  . Drug use: No  . Sexual activity: Yes  Other Topics Concern  . Not on file  Social History Narrative   Marital Status:  Married ([name]) Husband has been diagnosed with Alzheimers.  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     Alcohol Use:  Moderate (Wine), 2 glasses 3 times a week   Drug Use:  None   Diet:  Weight Watchers     Exercise:  Not regular   Hobbies: Reading , Gardening , Grandchildren            Social Determinants of Health   Financial Resource Strain:   . Difficulty of Paying Living Expenses:   Food Insecurity:   . Worried About Charity fundraiser in the Last Year:   . Arboriculturist in the Last Year:   Transportation Needs:   . Film/video editor (Medical):   Marland Kitchen Lack of Transportation (Non-Medical):   Physical Activity:   . Days of Exercise per Week:   . Minutes of Exercise per Session:   Stress:   . Feeling of Stress :   Social Connections:   . Frequency of Communication with Friends and Family:   . Frequency of Social Gatherings with Friends and Family:   . Attends Religious Services:   . Active Member of Clubs or Organizations:   . Attends Archivist Meetings:   Marland Kitchen Marital Status:   Intimate Partner Violence:   . Fear of Current or Ex-Partner:   . Emotionally Abused:   Marland Kitchen Physically Abused:   . Sexually Abused:  Past Surgical History:  Procedure Laterality Date  . BREAST LUMPECTOMY Left 09/2016  . COLONOSCOPY    . EYE SURGERY Bilateral    Cataracts   . HIP CLOSED REDUCTION Left 02/13/2019   Procedure: CLOSED MANIPULATION OF DISLOCATED HIP;  Surgeon: Newt Minion, MD;  Location: [place];  Service: Orthopedics;  Laterality: Left;  . ORIF PATELLA Right 08/28/2012   Procedure: OPEN REDUCTION INTERNAL (ORIF) FIXATION PATELLA- RIGHT;  Surgeon: Newt Minion, MD;  Location: [facility];  Service: Orthopedics;  Laterality: Right;  Open Reduction Internal Fixation Right [place]  . RADIOACTIVE SEED GUIDED PARTIAL MASTECTOMY WITH AXILLARY SENTINEL LYMPH NODE BIOPSY Left 10/03/2016   Procedure: LEFT BREAST RADIOACTIVE SEED GUIDED PARTIAL MASTECTOMY WITH LEFT SENTINEL LYMPH NODE MAPPING;  Surgeon: Erroll Luna, MD;  Location: [facility];  Service: General;  Laterality: Left;  . TONSILLECTOMY    . TOTAL HIP ARTHROPLASTY Left 01/13/2018   Procedure: LEFT TOTAL  HIP ARTHROPLASTY ANTERIOR APPROACH;  Surgeon: Mcarthur Rossetti, MD;  Location: [place];  Service: Orthopedics;  Laterality: Left;  . TUBAL LIGATION    . VAGINAL DELIVERY     x2 -     Family History  Problem Relation Age of Onset  . Heart disease Father 52       valve replacement    Allergies  Allergen Reactions  . Other Other (See Comments)    Sedation = BVM (??) per notes  "Pt with shallow respirations. Assisted by Rt with BVM"    Current Outpatient Medications on File Prior to Visit  Medication Sig Dispense Refill  . atorvastatin (LIPITOR) 20 MG tablet TAKE ONE-HALF TABLET BY MOUTH DAILY 45 tablet 0  . calcium-vitamin D (OSCAL WITH D) 500-200 MG-UNIT per tablet Take 1 tablet by mouth daily.     Marland Kitchen lisinopril-hydrochlorothiazide (ZESTORETIC) 20-25 MG tablet TAKE ONE TABLET BY MOUTH DAILY 90 tablet 0  . tamoxifen (NOLVADEX) 20 MG tablet Take 20 mg by mouth daily.     Marland Kitchen tolterodine (DETROL LA) 4 MG 24 hr capsule TAKE ONE CAPSULE BY MOUTH DAILY 75 capsule 0   No current facility-administered medications on file prior to visit.    BP 116/64   Pulse 93   Temp (!) 97.1 F (36.2 C) (Temporal)   Resp 16   Ht 5' 6.5" (1.689 m)   Wt 141 lb (64 kg)   SpO2 98%   BMI 22.42 kg/m       Objective:   Physical Exam Constitutional:      Appearance: [name] well-developed.  Neck:     Thyroid: No thyromegaly.  Cardiovascular:     Rate and Rhythm: Normal rate and regular rhythm.     Heart sounds: Normal heart sounds. No murmur.  Pulmonary:     Effort: Pulmonary effort is normal. No respiratory distress.     Breath sounds: Normal breath sounds. No wheezing.  Musculoskeletal:     Cervical back: Neck supple.  Skin:    General: Skin is warm and dry.  Neurological:     Mental Status: [name] alert and oriented to person, place, and time.  Psychiatric:        Behavior: Behavior normal.        Thought Content: Thought content normal.        Judgment: Judgment normal.            Assessment & Plan:  HTN- bp stable on current meds. Continue same, obtain follow up bmet. Initial bp mildly  elevated, follow up was improved.   Hyperlipidemia- maintained on atorvastatin.  [name] working on improving her diet due to elevated triglycerides noted last visit.  Lab Results  Component Value Date   CHOL 165 08/24/2019   HDL 48.30 08/24/2019   LDLCALC 82 07/15/2018   LDLDIRECT 78.0 08/24/2019   TRIG 207.0 (H) 08/24/2019   CHOLHDL 3 08/24/2019   OAB- stable on detrol. Continue same.   This visit occurred during the SARS-CoV-2 public health emergency.  Safety protocols were in place, including screening questions prior to the visit, additional usage of staff PPE, and extensive cleaning of exam room while observing appropriate contact time as indicated for disinfecting solutions.

## 2019-10-02 LAB — BASIC METABOLIC PANEL
BUN: 21 mg/dL (ref 7–25)
CO2: 24 mmol/L (ref 20–32)
Calcium: 10.3 mg/dL (ref 8.6–10.4)
Chloride: 101 mmol/L (ref 98–110)
Creat: 0.9 mg/dL (ref 0.60–0.93)
Glucose, Bld: 110 mg/dL — ABNORMAL HIGH (ref 65–99)
Potassium: 3.9 mmol/L (ref 3.5–5.3)
Sodium: 138 mmol/L (ref 135–146)

## 2019-11-23 ENCOUNTER — Other Ambulatory Visit: Payer: Self-pay | Admitting: Family

## 2019-12-03 NOTE — Progress Notes (Signed)
I connected with Brody today by telephone and verified that I am speaking with the correct person using two identifiers. Location patient: home Location provider: work Persons participating in the virtual visit: patient, Marine scientist.    I discussed the limitations, risks, security and privacy concerns of performing an evaluation and management service by telephone and the availability of in person appointments. I also discussed with the patient that there may be a patient responsible charge related to this service. The patient expressed understanding and verbally consented to this telephonic visit.    Interactive audio and video telecommunications were attempted between this provider and patient, however failed, due to patient having technical difficulties OR patient did not have access to video capability.  We continued and completed visit with audio only.  Some vital signs may be absent or patient reported.    Subjective:   Terry Mcneil is a 79 y.o. female who presents for Medicare Annual (Subsequent) preventive examination.  Review of Systems     Cardiac Risk Factors include: advanced age (>71men, >18 women);dyslipidemia;hypertension     Objective:     Advanced Directives 12/06/2019 02/12/2019 01/13/2018 01/13/2018 01/06/2018 11/05/2016 10/03/2016  Does Patient Have a Medical Advance Directive? Yes Yes Yes - Yes Yes Yes  Type of Advance Directive [place];Living will Healthcare Power of Attorney Living will;Healthcare Power of Attorney - Living will;Healthcare Power of [place];Living will Living will  Does patient want to make changes to medical advance directive? No - Patient declined No - Patient declined No - Patient declined - - - No - Patient declined  Copy of [place] in Chart? No - copy requested No - copy requested - No - copy requested No - copy requested No - copy requested No - copy requested  Would patient like  information on creating a medical advance directive? - - - - - - -    Current Medications (verified) Outpatient Encounter Medications as of 12/06/2019  Medication Sig   atorvastatin (LIPITOR) 20 MG tablet TAKE 1/2 TABLET BY MOUTH DAILY   calcium-vitamin D (OSCAL WITH D) 500-200 MG-UNIT per tablet Take 1 tablet by mouth daily.    lisinopril-hydrochlorothiazide (ZESTORETIC) 20-25 MG tablet TAKE ONE TABLET BY MOUTH DAILY   tamoxifen (NOLVADEX) 20 MG tablet Take 20 mg by mouth daily.    tolterodine (DETROL LA) 4 MG 24 hr capsule TAKE ONE CAPSULE BY MOUTH DAILY   No facility-administered encounter medications on file as of 12/06/2019.    Allergies (verified) Other   History: Past Medical History:  Diagnosis Date   Arthritis    back   Cancer ([place]) 09/2016   left breast cancer   Cataract    Colon polyps    Hypertension    Osteopenia 10/11/2009   Patellar fracture    Right Knee    Urge incontinence    Past Surgical History:  Procedure Laterality Date   BREAST LUMPECTOMY Left 09/2016   COLONOSCOPY     EYE SURGERY Bilateral    Cataracts    HIP CLOSED REDUCTION Left 02/13/2019   Procedure: CLOSED MANIPULATION OF DISLOCATED HIP;  Surgeon: Newt Minion, MD;  Location: [facility];  Service: Orthopedics;  Laterality: Left;   ORIF PATELLA Right 08/28/2012   Procedure: OPEN REDUCTION INTERNAL (ORIF) FIXATION PATELLA- RIGHT;  Surgeon: Newt Minion, MD;  Location: [facility];  Service: Orthopedics;  Laterality: Right;  Open Reduction Internal Fixation Right [place]   RADIOACTIVE SEED GUIDED PARTIAL MASTECTOMY  WITH AXILLARY SENTINEL LYMPH NODE BIOPSY Left 10/03/2016   Procedure: LEFT BREAST RADIOACTIVE SEED GUIDED PARTIAL MASTECTOMY WITH LEFT SENTINEL LYMPH NODE MAPPING;  Surgeon: Erroll Luna, MD;  Location: [facility];  Service: General;  Laterality: Left;   TONSILLECTOMY     TOTAL HIP ARTHROPLASTY Left 01/13/2018   Procedure: LEFT TOTAL HIP ARTHROPLASTY ANTERIOR  APPROACH;  Surgeon: Mcarthur Rossetti, MD;  Location: [facility];  Service: Orthopedics;  Laterality: Left;   TUBAL LIGATION     VAGINAL DELIVERY     x2 -    Family History  Problem Relation Age of Onset   Heart disease Father 76       valve replacement   Social History   Socioeconomic History   Marital status: Married    Spouse name: Alexie Lanni   Number of children: 2   Years of education: Not on file   Highest education level: Not on file  Occupational History   Occupation: Retired  Tobacco Use   Smoking status: Former Smoker    Quit date: 08/27/1978    Years since quitting: 41.3   Smokeless tobacco: Never Used  Vaping Use   Vaping Use: Never used  Substance and Sexual Activity   Alcohol use: Yes    Alcohol/week: 7.0 standard drinks    Types: 7 Glasses of wine per week   Drug use: No   Sexual activity: Yes  Other Topics Concern   Not on file  Social History Narrative   Marital Status:  Married ([name]) Husband has been diagnosed with Alzheimers.  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     Alcohol Use:  Moderate (Wine), 2 glasses 3 times a week   Drug Use:  None   Diet:  Weight Watchers    Exercise:  Not regular   Hobbies: Reading , Gardening , Grandchildren            Social Determinants of Health   Financial Resource Strain: Low Risk    Difficulty of Paying Living Expenses: Not hard at all  Food Insecurity: No Food Insecurity   Worried About Charity fundraiser in the Last Year: Never true   Arboriculturist in the Last Year: Never true  Transportation Needs: No Transportation Needs   Lack of Transportation (Medical): No   Lack of Transportation (Non-Medical): No  Physical  Activity:    Days of Exercise per Week:    Minutes of Exercise per Session:   Stress:    Feeling of Stress :   Social Connections:    Frequency of Communication with Friends and Family:    Frequency of Social Gatherings with Friends and Family:    Attends Religious Services:    Active Member of Clubs or Organizations:    Attends Archivist Meetings:    Marital Status:     Tobacco Counseling Counseling given: Not Answered   Clinical Intake: Pain : No/denies pain    Activities of Daily Living In your present state of health, do you have any difficulty performing the following activities: 12/06/2019 02/13/2019  Hearing? N N  Vision? N N  Difficulty concentrating or making decisions? N N  Walking or climbing stairs? N  N  Dressing or bathing? N N  Doing errands, shopping? N N  Preparing Food and eating ? N -  Using the Toilet? N -  In the past six months, have you accidently leaked urine? Y -  Do you have problems with loss of bowel control? N -  Managing your Medications? N -  Managing your Finances? N -  Housekeeping or managing your Housekeeping? N -  Some recent data might be hidden    Patient Care Team: [name], NP as PCP - General (Internal Medicine) Calvert Cantor, MD as Consulting Physician (Ophthalmology)  Indicate any recent Medical Services you may have received from other than [facility] providers in the past year (date may be approximate).     Assessment:   This is a routine wellness examination for [place].  Dietary issues and exercise activities discussed: Current Exercise Habits: The patient does not participate in regular exercise at present, Exercise limited by: None identified Diet (meal preparation, eat out, water intake, caffeinated beverages, dairy products, fruits and vegetables): well balanced   Goals     Increase water intake      Depression Screen PHQ 2/9 Scores 12/06/2019 08/24/2019 09/15/2017 08/21/2016 04/16/2016  01/16/2016 03/14/2015  PHQ - 2 Score 0 0 2 0 0 0 0  PHQ- 9 Score - 0 11 - - - -    Fall Risk Fall Risk  12/06/2019 08/24/2019 03/18/2017 11/05/2016 09/25/2016  Falls in the past year? 0 0 No No No  Number falls in past yr: 0 0 - - -  Injury with Fall? 0 0 - - -  Risk for fall due to : - - - - -  Follow up Education provided;Falls prevention discussed - - - -   Lives with husband in 1 story home. Any stairs in or around the home? Yes  If so, are there any without handrails? No  Home free of loose throw rugs in walkways, pet beds, electrical cords, etc? Yes  Adequate lighting in your home to reduce risk of falls? Yes   ASSISTIVE DEVICES UTILIZED TO PREVENT FALLS:  Life alert? No  Use of a cane, walker or w/c? No  Grab bars in the bathroom? Yes  Shower chair or bench in shower? Yes  Elevated toilet seat or a handicapped toilet? Yes     Cognitive Function: Ad8 score reviewed for issues:  Issues making decisions:no  Less interest in hobbies / activities:no  Repeats questions, stories (family complaining): no  Trouble using ordinary gadgets (microwave, computer, phone):no  Forgets the month or year: no  Mismanaging finances: no  Remembering appts:no  Daily problems with thinking and/or memory:no Ad8 score is=0     MMSE - Mini Mental State Exam 04/16/2016  Orientation to time 5  Orientation to Place 5  Registration 3  Attention/ Calculation 4  Recall 3  Language- name 2 objects 2  Language- repeat 1  Language- follow 3 step command 3  Language- read & follow direction 1  Write a sentence 1  Copy design 0  Total score 28        Immunizations Immunization History  Administered Date(s) Administered   Fluad Quad(high Dose 65+) 01/15/2019   Influenza, High Dose Seasonal PF 03/14/2015, 01/16/2016, 03/18/2017, 03/16/2018   Influenza,inj,Quad PF,6+ Mos 02/04/2013, 02/07/2014   PFIZER SARS-COV-2 Vaccination 06/01/2019, 06/22/2019   Pneumococcal Conjugate-13  09/07/2007   Pneumococcal Polysaccharide-23 03/09/2014   Td 06/13/2000, 09/10/2010   Zoster 09/04/2007    TDAP status: Up to date Flu  Vaccine status: Up to date Pneumococcal vaccine status: Up to date Covid-19 vaccine status: Completed vaccines  Qualifies for Shingles Vaccine? Yes   Zostavax completed Yes     Screening Tests Health Maintenance  Topic Date Due   Hepatitis C Screening  Never done   MAMMOGRAM  03/26/2019   INFLUENZA VACCINE  12/12/2019   TETANUS/TDAP  09/09/2020   DEXA SCAN  Completed   COVID-19 Vaccine  Completed   PNA vac Low Risk Adult  Completed    Health Maintenance  Health Maintenance Due  Topic Date Due   Hepatitis C Screening  Never done   MAMMOGRAM  03/26/2019    Colorectal cancer screening: No longer required. per pt Mammogram status: Completed 2020. Repeat every year per pt Bone density. Pt states she will discuss w/ PCP at upcoming appt.   Lung Cancer Screening: (Low Dose CT Chest recommended if Age 68-80 years, 30 pack-year currently smoking OR have quit w/in 15years.) does not qualify.   Additional Screening:   Vision Screening: Recommended annual ophthalmology exams for early detection of glaucoma and other disorders of the eye. Is the patient up to date with their annual eye exam?  Yes  per pt Who is the provider or what is the name of the office in which the patient attends annual eye exams? Dr.Digby   Dental Screening: Recommended annual dental exams for proper oral hygiene  Community Resource Referral / Chronic Care Management: CRR required this visit?  No   CCM required this visit?  No      Plan:    Please schedule your next medicare wellness visit with me in 1 yr.  Continue to eat heart healthy diet (full of fruits, vegetables, whole grains, lean protein, water--limit salt, fat, and sugar intake) and increase physical activity as tolerated.  Continue doing brain stimulating activities (puzzles, reading,  adult coloring books, staying active) to keep memory sharp.   Bring a copy of your living will and/or healthcare power of attorney to your next office visit.   I have personally reviewed and noted the following in the patients chart:    Medical and social history  Use of alcohol, tobacco or illicit drugs   Current medications and supplements  Functional ability and status  Nutritional status  Physical activity  Advanced directives  List of other physicians  Hospitalizations, surgeries, and ER visits in previous 12 months  Vitals  Screenings to include cognitive, depression, and falls  Referrals and appointments  In addition, I have reviewed and discussed with patient certain preventive protocols, quality metrics, and best practice recommendations. A written personalized care plan for preventive services as well as general preventive health recommendations were provided to patient.   Due to this being a telephonic visit, the after visit summary with patients personalized plan was offered to patient via mail or my-chart.  Patient preferred to pick up at office at next visit.   Naaman Plummer [place], [place]   12/06/2019

## 2019-12-06 ENCOUNTER — Ambulatory Visit (INDEPENDENT_AMBULATORY_CARE_PROVIDER_SITE_OTHER): Payer: Medicare PPO | Admitting: *Deleted

## 2019-12-06 ENCOUNTER — Other Ambulatory Visit: Payer: Self-pay

## 2019-12-06 ENCOUNTER — Encounter: Payer: Self-pay | Admitting: *Deleted

## 2019-12-06 DIAGNOSIS — Z0000 Encounter for general adult medical examination without abnormal findings: Secondary | ICD-10-CM | POA: Diagnosis not present

## 2019-12-06 NOTE — Patient Instructions (Signed)
Please schedule your next medicare wellness visit with me in 1 yr.  Continue to eat heart healthy diet (full of fruits, vegetables, whole grains, lean protein, water--limit salt, fat, and sugar intake) and increase physical activity as tolerated.  Continue doing brain stimulating activities (puzzles, reading, adult coloring books, staying active) to keep memory sharp.   Bring a copy of your living will and/or healthcare power of attorney to your next office visit.   Ms. Terry Mcneil , Thank you for taking time to come for your Medicare Wellness Visit. I appreciate your ongoing commitment to your health goals. Please review the following plan we discussed and let me know if I can assist you in the future.   These are the goals we discussed: Goals    . Increase water intake       This is a list of the screening recommended for you and due dates:  Health Maintenance  Topic Date Due  .  Hepatitis C: One time screening is recommended by Center for Disease Control  (CDC) for  adults born from [birth year] through 1966.   Never done  . Mammogram  03/26/2019  . Flu Shot  12/12/2019  . Tetanus Vaccine  09/09/2020  . DEXA scan (bone density measurement)  Completed  . COVID-19 Vaccine  Completed  . Pneumonia vaccines  Completed    Preventive Care 51 Years and Older, Female Preventive care refers to lifestyle choices and visits with your health care provider that can promote health and wellness. This includes:  A yearly physical exam. This is also called an annual well check.  Regular dental and eye exams.  Immunizations.  Screening for certain conditions.  Healthy lifestyle choices, such as diet and exercise. What can I expect for my preventive care visit? Physical exam Your health care provider will check:  Height and weight. These may be used to calculate body mass index (BMI), which is a measurement that tells if you are at a healthy weight.  Heart rate and blood pressure.  Your skin for  abnormal spots. Counseling Your health care provider may ask you questions about:  Alcohol, tobacco, and drug use.  Emotional well-being.  Home and relationship well-being.  Sexual activity.  Eating habits.  History of falls.  Memory and ability to understand (cognition).  Work and work Statistician.  Pregnancy and menstrual history. What immunizations do I need?  Influenza (flu) vaccine  This is recommended every year. Tetanus, diphtheria, and pertussis (Tdap) vaccine  You may need a Td booster every 10 years. Varicella (chickenpox) vaccine  You may need this vaccine if you have not already been vaccinated. Zoster (shingles) vaccine  You may need this after age 80. Pneumococcal conjugate (PCV13) vaccine  One dose is recommended after age 80. Pneumococcal polysaccharide (PPSV23) vaccine  One dose is recommended after age 80. Measles, mumps, and rubella (MMR) vaccine  You may need at least one dose of MMR if you were born in 1957 or later. You may also need a second dose. Meningococcal conjugate (MenACWY) vaccine  You may need this if you have certain conditions. Hepatitis A vaccine  You may need this if you have certain conditions or if you travel or work in places where you may be exposed to hepatitis A. Hepatitis B vaccine  You may need this if you have certain conditions or if you travel or work in places where you may be exposed to hepatitis B. Haemophilus influenzae type b (Hib) vaccine  You may need this if  you have certain conditions. You may receive vaccines as individual doses or as more than one vaccine together in one shot (combination vaccines). Talk with your health care provider about the risks and benefits of combination vaccines. What tests do I need? Blood tests  Lipid and cholesterol levels. These may be checked every 5 years, or more frequently depending on your overall health.  Hepatitis C test.  Hepatitis B test. Screening  Lung  cancer screening. You may have this screening every year starting at age 80 if you have a 30-pack-year history of smoking and currently smoke or have quit within the past 15 years.  Colorectal cancer screening. All adults should have this screening starting at age 80 and continuing until age 80. Your health care provider may recommend screening at age 80 if you are at increased risk. You will have tests every 1-10 years, depending on your results and the type of screening test.  Diabetes screening. This is done by checking your blood sugar (glucose) after you have not eaten for a while (fasting). You may have this done every 1-3 years.  Mammogram. This may be done every 1-2 years. Talk with your health care provider about how often you should have regular mammograms.  BRCA-related cancer screening. This may be done if you have a family history of breast, ovarian, tubal, or peritoneal cancers. Other tests  Sexually transmitted disease (STD) testing.  Bone density scan. This is done to screen for osteoporosis. You may have this done starting at age 80. Follow these instructions at home: Eating and drinking  Eat a diet that includes fresh fruits and vegetables, whole grains, lean protein, and low-fat dairy products. Limit your intake of foods with high amounts of sugar, saturated fats, and salt.  Take vitamin and mineral supplements as recommended by your health care provider.  Do not drink alcohol if your health care provider tells you not to drink.  If you drink alcohol: ? Limit how much you have to 0-1 drink a day. ? Be aware of how much alcohol is in your drink. In the [place] one drink equals one 12 oz bottle of beer (355 mL), one 5 oz glass of wine (148 mL), or one 1 oz glass of hard liquor (44 mL). Lifestyle  Take daily care of your teeth and gums.  Stay active. Exercise for at least 30 minutes on 5 or more days each week.  Do not use any products that contain nicotine or tobacco,  such as cigarettes, e-cigarettes, and chewing tobacco. If you need help quitting, ask your health care provider.  If you are sexually active, practice safe sex. Use a condom or other form of protection in order to prevent STIs (sexually transmitted infections).  Talk with your health care provider about taking a low-dose aspirin or statin. What's next?  Go to your health care provider once a year for a well check visit.  Ask your health care provider how often you should have your eyes and teeth checked.  Stay up to date on all vaccines. This information is not intended to replace advice given to you by your health care provider. Make sure you discuss any questions you have with your health care provider. Document Revised: 04/23/2018 Document Reviewed: 04/23/2018 Elsevier Patient Education  2020 [employer].

## 2019-12-10 ENCOUNTER — Other Ambulatory Visit: Payer: Self-pay | Admitting: Family

## 2020-01-14 DIAGNOSIS — Z79810 Long term (current) use of selective estrogen receptor modulators (SERMs): Secondary | ICD-10-CM | POA: Diagnosis not present

## 2020-01-14 DIAGNOSIS — R2681 Unsteadiness on feet: Secondary | ICD-10-CM | POA: Diagnosis not present

## 2020-01-14 DIAGNOSIS — C50812 Malignant neoplasm of overlapping sites of left female breast: Secondary | ICD-10-CM | POA: Diagnosis not present

## 2020-01-14 DIAGNOSIS — Z170 Estrogen receptor positive status [ER+]: Secondary | ICD-10-CM | POA: Diagnosis not present

## 2020-01-14 DIAGNOSIS — M25561 Pain in right knee: Secondary | ICD-10-CM | POA: Diagnosis not present

## 2020-01-14 DIAGNOSIS — Z8719 Personal history of other diseases of the digestive system: Secondary | ICD-10-CM | POA: Diagnosis not present

## 2020-01-14 DIAGNOSIS — M25562 Pain in left knee: Secondary | ICD-10-CM | POA: Diagnosis not present

## 2020-01-14 DIAGNOSIS — Z79899 Other long term (current) drug therapy: Secondary | ICD-10-CM | POA: Diagnosis not present

## 2020-01-14 DIAGNOSIS — M8589 Other specified disorders of bone density and structure, multiple sites: Secondary | ICD-10-CM | POA: Diagnosis not present

## 2020-02-21 ENCOUNTER — Other Ambulatory Visit: Payer: Self-pay | Admitting: Family

## 2020-04-07 ENCOUNTER — Ambulatory Visit: Payer: Medicare PPO | Admitting: Family

## 2020-05-19 ENCOUNTER — Other Ambulatory Visit: Payer: Self-pay

## 2020-05-19 ENCOUNTER — Ambulatory Visit: Payer: Medicare PPO | Admitting: Family

## 2020-05-19 VITALS — BP 135/75 | HR 97 | Temp 97.8°F | Resp 16 | Ht 66.0 in | Wt 154.0 lb

## 2020-05-19 DIAGNOSIS — Z23 Encounter for immunization: Secondary | ICD-10-CM

## 2020-05-19 DIAGNOSIS — N3281 Overactive bladder: Secondary | ICD-10-CM | POA: Diagnosis not present

## 2020-05-19 DIAGNOSIS — E785 Hyperlipidemia, unspecified: Secondary | ICD-10-CM | POA: Diagnosis not present

## 2020-05-19 DIAGNOSIS — R739 Hyperglycemia, unspecified: Secondary | ICD-10-CM

## 2020-05-19 MED ORDER — ATORVASTATIN CALCIUM 20 MG PO TABS: 10.0000 mg | ORAL_TABLET | Freq: Every day | ORAL | 1 refills | Status: DC

## 2020-05-19 MED ORDER — TOLTERODINE TARTRATE ER 4 MG PO CP24: 4.0000 mg | ORAL_CAPSULE | Freq: Every day | ORAL | 1 refills | Status: DC

## 2020-05-19 MED ORDER — LISINOPRIL-HYDROCHLOROTHIAZIDE 20-25 MG PO TABS: 1.0000 | ORAL_TABLET | Freq: Every day | ORAL | 1 refills | Status: DC

## 2020-05-19 NOTE — Patient Instructions (Addendum)
Please schedule an appointment at your pharmacy for Shingrix vaccine and Tetanus vaccine. Complete lab work prior to leaving.

## 2020-05-19 NOTE — Progress Notes (Signed)
Subjective:    Patient ID: Terry Mcneil, female    DOB: [birth year], 80 y.o.   MRN: [id number]  HPI  Patient is an 80 yr old female who presents today for follow up.   HTN- maintained on lisinopril-hctz 20-25mg .  BP Readings from Last 3 Encounters:  05/19/20 135/75  10/01/19 116/64  08/24/19 99/72   Hyperlipidemia- maintained on atorvastatin 20mg .  Lab Results  Component Value Date   CHOL 165 08/24/2019   HDL 48.30 08/24/2019   LDLCALC 82 07/15/2018   LDLDIRECT 78.0 08/24/2019   TRIG 207.0 (H) 08/24/2019   CHOLHDL 3 08/24/2019   OAB- maintained on detrol LA 4mg . Reports improvement with detrol.   Wt Readings from Last 3 Encounters:  05/19/20 154 lb (69.9 kg)  10/01/19 141 lb (64 kg)  08/24/19 143 lb (64.9 kg)    Review of Systems See HPI  Past Medical History:  Diagnosis Date  . Arthritis    back  . Cancer ([place]) 09/2016   left breast cancer  . Cataract   . Colon polyps   . Hypertension   . Osteopenia 10/11/2009  . Patellar fracture    Right Knee   . Urge incontinence      Social History   Socioeconomic History  . Marital status: Married    Spouse name: Daleyza Gadomski  . Number of children: 2  . Years of education: Not on file  . Highest education level: Not on file  Occupational History  . Occupation: Retired  Tobacco Use  . Smoking status: Former Smoker    Quit date: 08/27/1978    Years since quitting: 41.7  . Smokeless tobacco: Never Used  Vaping Use  . Vaping Use: Never used  Substance and Sexual Activity  . Alcohol use: Yes    Alcohol/week: 7.0 standard drinks    Types: 7 Glasses of wine per week  . Drug use: No  . Sexual activity: Yes  Other Topics Concern  . Not on file  Social History Narrative   Marital Status:  Married ([name]) Husband has been diagnosed with Alzheimers.  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     Alcohol Use:  Moderate (Wine), 2 glasses 3 times a week   Drug Use:  None   Diet:  Weight Watchers    Exercise:  Not regular   Hobbies: Reading , Gardening , Grandchildren            Social Determinants of Health   Financial Resource Strain: Low Risk   . Difficulty of Paying Living Expenses: Not hard at all  Food Insecurity: No Food Insecurity  . Worried About Charity fundraiser in the Last Year: Never true  . Ran Out of Food in the Last Year: Never true  Transportation Needs: No Transportation Needs  . Lack of Transportation (Medical): No  . Lack of Transportation (Non-Medical): No  Physical Activity: Not on file  Stress: Not on file  Social Connections: Not on file  Intimate Partner Violence: Not on file    Past Surgical History:  Procedure Laterality Date  . BREAST LUMPECTOMY Left 09/2016  . COLONOSCOPY    . EYE SURGERY Bilateral    Cataracts   .  HIP CLOSED REDUCTION Left 02/13/2019   Procedure: CLOSED MANIPULATION OF DISLOCATED HIP;  Surgeon: Newt Minion, MD;  Location: [place];  Service: Orthopedics;  Laterality: Left;  . ORIF PATELLA Right 08/28/2012   Procedure: OPEN REDUCTION INTERNAL (ORIF) FIXATION PATELLA- RIGHT;  Surgeon: Newt Minion, MD;  Location: [place];  Service: Orthopedics;  Laterality: Right;  Open Reduction Internal Fixation Right Patella  . RADIOACTIVE SEED GUIDED PARTIAL MASTECTOMY WITH AXILLARY SENTINEL LYMPH NODE BIOPSY Left 10/03/2016   Procedure: LEFT BREAST RADIOACTIVE SEED GUIDED PARTIAL MASTECTOMY WITH LEFT SENTINEL LYMPH NODE MAPPING;  Surgeon: Erroll Luna, MD;  Location: [place];  Service: General;  Laterality: Left;  . TONSILLECTOMY    . TOTAL HIP ARTHROPLASTY Left 01/13/2018   Procedure: LEFT TOTAL HIP ARTHROPLASTY ANTERIOR APPROACH;  Surgeon: Mcarthur Rossetti, MD;  Location: [place];  Service: Orthopedics;  Laterality: Left;  . TUBAL LIGATION    . VAGINAL DELIVERY     x2 -     Family History  Problem Relation Age of Onset  . Heart disease Father 16       valve replacement    Allergies  Allergen Reactions  . Other Other (See Comments)    Sedation = BVM (??) per notes  "Pt with shallow respirations. Assisted by Rt with BVM"    Current Outpatient Medications on File Prior to Visit  Medication Sig Dispense Refill  . atorvastatin (LIPITOR) 20 MG tablet TAKE 1/2 TABLET BY MOUTH DAILY 45 tablet 0  . calcium-vitamin D (OSCAL WITH D) 500-200 MG-UNIT per tablet Take 1 tablet by mouth daily.     Marland Kitchen lisinopril-hydrochlorothiazide (ZESTORETIC) 20-25 MG tablet TAKE ONE TABLET BY MOUTH DAILY 90 tablet 0  . tamoxifen (NOLVADEX) 20 MG tablet Take 20 mg by mouth daily.     Marland Kitchen tolterodine (DETROL LA) 4 MG 24 hr capsule TAKE ONE CAPSULE BY MOUTH DAILY 90 capsule 0   No current facility-administered medications on file prior to visit.    BP 135/75 (BP Location: Right Arm, Patient Position: Sitting, Cuff Size: Small)   Pulse 97   Temp 97.8 F (36.6 C) (Oral)   Resp 16   Ht 5\' 6"  (1.676 m)   Wt 154 lb (69.9 kg)   SpO2 98%   BMI 24.86 kg/m       Objective:   Physical Exam Constitutional:      Appearance: [name] well-developed and well-nourished.  Cardiovascular:     Rate and Rhythm: Normal rate and regular rhythm.     Heart sounds: Normal heart sounds. No murmur heard.   Pulmonary:     Effort: Pulmonary effort is normal. No respiratory distress.     Breath sounds: Normal breath sounds. No wheezing.  Psychiatric:        Mood and Affect: Mood and affect normal.        Behavior: Behavior normal.        Thought Content: Thought content normal.        Judgment: Judgment normal.           Assessment & Plan:  HTN- bp stable. Continue Lisinopril hctz 20-25mg .    Hyperlipidemia- continue atorvastatin 20mg .  Obtain follow up lipid panel.   OAB- stable one  detrol 4mg . Conitnue same.   This visit occurred during the SARS-CoV-2 public health emergency.  Safety protocols were in place, including screening questions prior to the visit, additional usage of staff PPE, and extensive cleaning of exam room while observing  appropriate contact time as indicated for disinfecting solutions.

## 2020-05-20 LAB — COMPREHENSIVE METABOLIC PANEL
AG Ratio: 1.9 (calc) (ref 1.0–2.5)
ALT: 10 U/L (ref 6–29)
AST: 17 U/L (ref 10–35)
Albumin: 4.1 g/dL (ref 3.6–5.1)
Alkaline phosphatase (APISO): 45 U/L (ref 37–153)
BUN/Creatinine Ratio: 24 (calc) — ABNORMAL HIGH (ref 6–22)
BUN: 22 mg/dL (ref 7–25)
CO2: 26 mmol/L (ref 20–32)
Calcium: 10.1 mg/dL (ref 8.6–10.4)
Chloride: 102 mmol/L (ref 98–110)
Creat: 0.92 mg/dL — ABNORMAL HIGH (ref 0.60–0.88)
Globulin: 2.2 g/dL (calc) (ref 1.9–3.7)
Glucose, Bld: 107 mg/dL — ABNORMAL HIGH (ref 65–99)
Potassium: 3.8 mmol/L (ref 3.5–5.3)
Sodium: 139 mmol/L (ref 135–146)
Total Bilirubin: 0.5 mg/dL (ref 0.2–1.2)
Total Protein: 6.3 g/dL (ref 6.1–8.1)

## 2020-05-20 LAB — HEMOGLOBIN A1C
Hgb A1c MFr Bld: 5.5 % of total Hgb (ref <5.7)
Mean Plasma Glucose: 111 mg/dL
eAG (mmol/L): 6.2 mmol/L

## 2020-05-20 LAB — LIPID PANEL
Cholesterol: 169 mg/dL (ref <200)
HDL: 64 mg/dL (ref >=50)
LDL Cholesterol (Calc): 85 mg/dL (calc)
Non-HDL Cholesterol (Calc): 105 mg/dL (calc) (ref <130)
Total CHOL/HDL Ratio: 2.6 (calc) (ref <5.0)
Triglycerides: 102 mg/dL (ref <150)

## 2020-05-21 ENCOUNTER — Other Ambulatory Visit: Payer: Self-pay | Admitting: Family

## 2020-05-21 ENCOUNTER — Encounter: Payer: Self-pay | Admitting: Family

## 2020-05-22 NOTE — Progress Notes (Signed)
Mailed out to pt 

## 2020-07-17 DIAGNOSIS — Z79899 Other long term (current) drug therapy: Secondary | ICD-10-CM | POA: Diagnosis not present

## 2020-07-17 DIAGNOSIS — Z923 Personal history of irradiation: Secondary | ICD-10-CM | POA: Diagnosis not present

## 2020-07-17 DIAGNOSIS — Z170 Estrogen receptor positive status [ER+]: Secondary | ICD-10-CM | POA: Diagnosis not present

## 2020-07-17 DIAGNOSIS — Z87891 Personal history of nicotine dependence: Secondary | ICD-10-CM | POA: Diagnosis not present

## 2020-07-17 DIAGNOSIS — E782 Mixed hyperlipidemia: Secondary | ICD-10-CM | POA: Diagnosis not present

## 2020-07-17 DIAGNOSIS — C50812 Malignant neoplasm of overlapping sites of left female breast: Secondary | ICD-10-CM | POA: Diagnosis not present

## 2020-07-17 DIAGNOSIS — M8589 Other specified disorders of bone density and structure, multiple sites: Secondary | ICD-10-CM | POA: Diagnosis not present

## 2020-07-17 DIAGNOSIS — Z79810 Long term (current) use of selective estrogen receptor modulators (SERMs): Secondary | ICD-10-CM | POA: Diagnosis not present

## 2020-07-24 DIAGNOSIS — M85851 Other specified disorders of bone density and structure, right thigh: Secondary | ICD-10-CM | POA: Diagnosis not present

## 2020-07-24 DIAGNOSIS — Z780 Asymptomatic menopausal state: Secondary | ICD-10-CM | POA: Diagnosis not present

## 2020-07-24 DIAGNOSIS — M8589 Other specified disorders of bone density and structure, multiple sites: Secondary | ICD-10-CM | POA: Diagnosis not present

## 2020-07-31 DIAGNOSIS — Z1231 Encounter for screening mammogram for malignant neoplasm of breast: Secondary | ICD-10-CM | POA: Diagnosis not present

## 2020-07-31 LAB — HM MAMMOGRAPHY

## 2020-11-17 ENCOUNTER — Other Ambulatory Visit: Payer: Self-pay

## 2020-11-17 ENCOUNTER — Ambulatory Visit: Payer: Medicare PPO | Admitting: Family

## 2020-11-17 ENCOUNTER — Other Ambulatory Visit: Payer: Self-pay | Admitting: Family

## 2020-11-17 VITALS — BP 129/69 | HR 100 | Temp 98.3°F | Resp 16 | Wt 148.0 lb

## 2020-11-17 DIAGNOSIS — E785 Hyperlipidemia, unspecified: Secondary | ICD-10-CM | POA: Diagnosis not present

## 2020-11-17 DIAGNOSIS — N3281 Overactive bladder: Secondary | ICD-10-CM

## 2020-11-17 DIAGNOSIS — I10 Essential (primary) hypertension: Secondary | ICD-10-CM | POA: Diagnosis not present

## 2020-11-17 LAB — BASIC METABOLIC PANEL
BUN: 19 mg/dL (ref 7–25)
CO2: 24 mmol/L (ref 20–32)
Calcium: 10.2 mg/dL (ref 8.6–10.4)
Chloride: 98 mmol/L (ref 98–110)
Creat: 0.82 mg/dL (ref 0.60–0.88)
Glucose, Bld: 114 mg/dL — ABNORMAL HIGH (ref 65–99)
Potassium: 3.4 mmol/L — ABNORMAL LOW (ref 3.5–5.3)
Sodium: 137 mmol/L (ref 135–146)

## 2020-11-17 MED ORDER — TETANUS-DIPHTHERIA TOXOIDS TD 2-2 LF/0.5ML IM SUSP: 0.5000 mL | Freq: Once | INTRAMUSCULAR | 0 refills | Status: AC

## 2020-11-17 NOTE — Assessment & Plan Note (Signed)
Stable on detrol la, continue Detrol LA.

## 2020-11-17 NOTE — Assessment & Plan Note (Signed)
BP stable on zestoretic 20-25.

## 2020-11-17 NOTE — Patient Instructions (Signed)
Please complete lab work prior to leaving. You are due for Covid booster #2, Shingles shot and tetanus shot. You can get each of these at the pharmacy.

## 2020-11-17 NOTE — Assessment & Plan Note (Signed)
Lab Results  Component Value Date   CHOL 169 05/19/2020   HDL 64 05/19/2020   LDLCALC 85 05/19/2020   LDLDIRECT 78.0 08/24/2019   TRIG 102 05/19/2020   CHOLHDL 2.6 05/19/2020   Stable on lipitor 20mg  once daily.

## 2020-11-17 NOTE — Progress Notes (Signed)
Subjective:   By signing my name below, I, Terry Mcneil, attest that this documentation has been prepared under the direction and in the presence of Terry Mcneil. 11/17/2020      Patient ID: Terry Mcneil, female    DOB: February 21, 1941, 80 y.o.   MRN: [id number]  Chief Complaint  Patient presents with   Hypertension    Here for follow up   Hyperlipidemia    Here for follow up    HPI Patient is in today for a office visit.  Hypertension- She continues taking 20-25 mg lisinopril-hydrochlorothiazide daily PO to manage her blood pressure.  BP Readings from Last 3 Encounters:  11/17/20 129/69  05/19/20 135/75  10/01/19 116/64   Overactive bladder- She continues taking 4 mg detrol daily PO to manage her overactive bladder and reports doing well. Immunization- She has 3 Covid-19 vaccines at this time and is willing the get the 2nd booster vaccine. [name] due for the shingles vaccine. [name] due for a tetanus vaccine.  Health Maintenance Due  Topic Date Due   Zoster Vaccines- Shingrix (1 of 2) Never done   COVID-19 Vaccine (3 - Pfizer risk series) 07/20/2019   MAMMOGRAM  07/25/2020   TETANUS/TDAP  09/09/2020    Past Medical History:  Diagnosis Date   Arthritis    back   Cancer ([place]) 09/2016   left breast cancer   Cataract    Colon polyps    Hypertension    Osteopenia 10/11/2009   Patellar fracture    Right Knee    Urge incontinence     Past Surgical History:  Procedure Laterality Date   BREAST LUMPECTOMY Left 09/2016   COLONOSCOPY     EYE SURGERY Bilateral    Cataracts    HIP CLOSED REDUCTION Left 02/13/2019   Procedure: CLOSED MANIPULATION OF DISLOCATED HIP;  Surgeon: Newt Minion, MD;  Location: [place];  Service: Orthopedics;  Laterality: Left;   ORIF PATELLA Right 08/28/2012   Procedure: OPEN REDUCTION INTERNAL (ORIF) FIXATION PATELLA- RIGHT;  Surgeon: Newt Minion, MD;  Location: [place];  Service: Orthopedics;  Laterality: Right;  Open Reduction Internal  Fixation Right Patella   RADIOACTIVE SEED GUIDED PARTIAL MASTECTOMY WITH AXILLARY SENTINEL LYMPH NODE BIOPSY Left 10/03/2016   Procedure: LEFT BREAST RADIOACTIVE SEED GUIDED PARTIAL MASTECTOMY WITH LEFT SENTINEL LYMPH NODE MAPPING;  Surgeon: Erroll Luna, MD;  Location: [place];  Service: General;  Laterality: Left;   TONSILLECTOMY     TOTAL HIP ARTHROPLASTY Left 01/13/2018   Procedure: LEFT TOTAL HIP ARTHROPLASTY ANTERIOR APPROACH;  Surgeon: Mcarthur Rossetti, MD;  Location: [place];  Service: Orthopedics;  Laterality: Left;   TUBAL LIGATION     VAGINAL DELIVERY     x2 -     Family History  Problem Relation Age of Onset   Heart disease Father 96       valve replacement    Social History   Socioeconomic History   Marital status: Married    Spouse name: Terry Mcneil   Number of children: 2   Years of education: Not on file   Highest education level: Not on file  Occupational History   Occupation: Retired  Tobacco Use   Smoking status: Former    Pack years: 0.00    Types: Cigarettes    Quit date: 08/27/1978    Years since quitting: 42.2   Smokeless tobacco: Never  Vaping Use   Vaping Use: Never used  Substance and  Sexual Activity   Alcohol use: Yes    Alcohol/week: 7.0 standard drinks    Types: 7 Glasses of wine per week   Drug use: No   Sexual activity: Yes  Other Topics Concern   Not on file  Social History Narrative   Marital Status:  Married ([name]) Husband has been diagnosed with Alzheimers.  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     Alcohol Use:  Moderate (Wine), 2 glasses 3 times a week   Drug Use:  None   Diet:  Weight Watchers    Exercise:  Not regular   Hobbies: Reading ,  Gardening , Grandchildren            Social Determinants of Health   Financial Resource Strain: Low Risk    Difficulty of Paying Living Expenses: Not hard at all  Food Insecurity: No Food Insecurity   Worried About Charity fundraiser in the Last Year: Never true   Arboriculturist in the Last Year: Never true  Transportation Needs: No Transportation Needs   Lack of Transportation (Medical): No   Lack of Transportation (Non-Medical): No  Physical Activity: Not on file  Stress: Not on file  Social Connections: Not on file  Intimate Partner Violence: Not on file    Outpatient Medications Prior to Visit  Medication Sig Dispense Refill   atorvastatin (LIPITOR) 20 MG tablet Take 0.5 tablets (10 mg total) by mouth daily. 45 tablet 1   calcium-vitamin D (OSCAL WITH D) 500-200 MG-UNIT per tablet Take 1 tablet by mouth daily.      lisinopril-hydrochlorothiazide (ZESTORETIC) 20-25 MG tablet Take 1 tablet by mouth daily. 90 tablet 1   tamoxifen (NOLVADEX) 20 MG tablet Take 20 mg by mouth daily.      tolterodine (DETROL LA) 4 MG 24 hr capsule Take 1 capsule (4 mg total) by mouth daily. 90 capsule 1   No facility-administered medications prior to visit.    Allergies  Allergen Reactions   Other Other (See Comments)    Sedation = BVM (??) per notes  "Pt with shallow respirations. Assisted by Rt with BVM"    ROS     Objective:    Physical Exam Constitutional:      General: [name] not in acute distress.    Appearance: Normal appearance. [name] not ill-appearing.  HENT:     Head: Normocephalic and atraumatic.     Right Ear: External ear normal.     Left Ear: External ear normal.  Eyes:     Extraocular Movements: Extraocular movements intact.     Pupils: Pupils are equal, round, and reactive to light.  Cardiovascular:     Rate and Rhythm: Normal rate and regular rhythm.     Pulses: Normal pulses.     Heart sounds: Normal heart sounds. No murmur heard.   No gallop.  Pulmonary:      Effort: Pulmonary effort is normal. No respiratory distress.     Breath sounds: Normal breath sounds. No wheezing, rhonchi or rales.  Skin:    General: Skin is warm and dry.  Neurological:     Mental Status: [name] alert and oriented to person, place, and time.  Psychiatric:        Behavior: Behavior normal.    BP 129/69 (BP Location: Right Arm, Patient Position: Sitting, Cuff Size: Small)   Pulse 100   Temp 98.3 F (36.8 C) (Oral)   Resp 16   Wt 148 lb (67.1 kg)   SpO2 96%   BMI 23.89 kg/m  Wt Readings from Last 3 Encounters:  11/17/20 148 lb (67.1 kg)  05/19/20 154 lb (69.9 kg)  10/01/19 141 lb (64 kg)       Assessment & Plan:   Problem List Items Addressed This Visit       Unprioritized   Overactive bladder    Stable on detrol la, continue Detrol LA.         Hyperlipidemia    Lab Results  Component Value Date   CHOL 169 05/19/2020   HDL 64 05/19/2020   LDLCALC 85 05/19/2020   LDLDIRECT 78.0 08/24/2019   TRIG 102 05/19/2020   CHOLHDL 2.6 05/19/2020  Stable on lipitor 20mg  once daily.         Essential hypertension, benign    BP stable on zestoretic 20-25.         Other Visit Diagnoses     Primary hypertension    -  Primary   Relevant Orders   Basic metabolic panel        Meds ordered this encounter  Medications   diptheria-tetanus toxoids (DECAVAC) 2-2 LF/0.5ML injection    Sig: Inject 0.5 mLs into the muscle once for 1 dose.    Dispense:  0.5 mL    Refill:  0    Order Specific Question:   Supervising Provider    Answer:   Penni Homans A [[id number]]    I, Terry Mcneil, personally preformed the services described in this documentation.  All medical record entries made by the scribe were at my direction and in my presence.  I have reviewed the chart and discharge instructions (if applicable) and agree that the record reflects my personal performance and is accurate and complete. 11/17/2020   I,Terry Mcneil,acting as a scribe  for Nance Pear, Mcneil.,have documented all relevant documentation on the behalf of Nance Pear, Mcneil,as directed by  Nance Pear, Mcneil while in the presence of Nance Pear, Mcneil.   Nance Pear, Mcneil

## 2020-11-18 ENCOUNTER — Telehealth: Payer: Self-pay | Admitting: Family

## 2020-11-18 DIAGNOSIS — E876 Hypokalemia: Secondary | ICD-10-CM

## 2020-11-18 MED ORDER — POTASSIUM CHLORIDE CRYS ER 10 MEQ PO TBCR: 10.0000 meq | EXTENDED_RELEASE_TABLET | Freq: Two times a day (BID) | ORAL | 5 refills | Status: DC

## 2020-11-18 NOTE — Telephone Encounter (Signed)
Potassium is a little low.  Please add kdur 10 mEQ once daily. Repeat bmet in 1 week.

## 2020-11-20 ENCOUNTER — Other Ambulatory Visit: Payer: Self-pay

## 2020-11-20 DIAGNOSIS — E875 Hyperkalemia: Secondary | ICD-10-CM

## 2020-11-20 MED ORDER — POTASSIUM CHLORIDE ER 10 MEQ PO TBCR: 10.0000 meq | EXTENDED_RELEASE_TABLET | Freq: Every day | ORAL | 1 refills | Status: DC

## 2020-11-20 NOTE — Telephone Encounter (Signed)
Pt aware and lab appt set. Medication sent

## 2020-11-23 ENCOUNTER — Encounter: Payer: Self-pay | Admitting: *Deleted

## 2020-11-27 ENCOUNTER — Other Ambulatory Visit: Payer: Self-pay

## 2020-11-27 ENCOUNTER — Other Ambulatory Visit (INDEPENDENT_AMBULATORY_CARE_PROVIDER_SITE_OTHER): Payer: Medicare PPO

## 2020-11-27 DIAGNOSIS — E875 Hyperkalemia: Secondary | ICD-10-CM

## 2020-11-28 ENCOUNTER — Encounter: Payer: Self-pay | Admitting: Family

## 2020-11-28 ENCOUNTER — Telehealth: Payer: Self-pay | Admitting: Family

## 2020-11-28 LAB — BASIC METABOLIC PANEL
BUN: 22 mg/dL (ref 6–23)
CO2: 25 mEq/L (ref 19–32)
Calcium: 9.5 mg/dL (ref 8.4–10.5)
Chloride: 102 mEq/L (ref 96–112)
Creatinine, Ser: 0.85 mg/dL (ref 0.40–1.20)
GFR: 64.46 mL/min (ref >60.00)
Glucose, Bld: 141 mg/dL — ABNORMAL HIGH (ref 70–99)
Potassium: 3.6 mEq/L (ref 3.5–5.1)
Sodium: 138 mEq/L (ref 135–145)

## 2020-11-28 NOTE — Telephone Encounter (Signed)
Spoke with patient she declined AWV  

## 2020-12-06 NOTE — Progress Notes (Signed)
Mailed out to [facility]

## 2021-01-10 DIAGNOSIS — Z7983 Long term (current) use of bisphosphonates: Secondary | ICD-10-CM | POA: Diagnosis not present

## 2021-01-10 DIAGNOSIS — M87180 Osteonecrosis due to drugs, jaw: Secondary | ICD-10-CM | POA: Diagnosis not present

## 2021-01-10 DIAGNOSIS — T458X5A Adverse effect of other primarily systemic and hematological agents, initial encounter: Secondary | ICD-10-CM | POA: Diagnosis not present

## 2021-01-17 DIAGNOSIS — C50812 Malignant neoplasm of overlapping sites of left female breast: Secondary | ICD-10-CM | POA: Diagnosis not present

## 2021-01-17 DIAGNOSIS — Z170 Estrogen receptor positive status [ER+]: Secondary | ICD-10-CM | POA: Diagnosis not present

## 2021-04-25 ENCOUNTER — Other Ambulatory Visit: Payer: Self-pay | Admitting: Family

## 2021-05-16 ENCOUNTER — Other Ambulatory Visit: Payer: Self-pay | Admitting: Family

## 2021-05-25 ENCOUNTER — Ambulatory Visit: Payer: Medicare PPO | Admitting: Family

## 2021-05-25 VITALS — BP 110/67 | HR 110 | Temp 97.7°F | Resp 16 | Wt 147.0 lb

## 2021-05-25 DIAGNOSIS — I10 Essential (primary) hypertension: Secondary | ICD-10-CM | POA: Diagnosis not present

## 2021-05-25 DIAGNOSIS — M8580 Other specified disorders of bone density and structure, unspecified site: Secondary | ICD-10-CM | POA: Diagnosis not present

## 2021-05-25 DIAGNOSIS — C50919 Malignant neoplasm of unspecified site of unspecified female breast: Secondary | ICD-10-CM | POA: Diagnosis not present

## 2021-05-25 DIAGNOSIS — E785 Hyperlipidemia, unspecified: Secondary | ICD-10-CM

## 2021-05-25 DIAGNOSIS — Z23 Encounter for immunization: Secondary | ICD-10-CM

## 2021-05-25 DIAGNOSIS — N3281 Overactive bladder: Secondary | ICD-10-CM | POA: Diagnosis not present

## 2021-05-25 MED ORDER — TETANUS-DIPHTHERIA TOXOIDS TD 5-2 LFU IM INJ: 0.5000 mL | INJECTION | Freq: Once | INTRAMUSCULAR | 0 refills | Status: AC

## 2021-05-25 NOTE — Assessment & Plan Note (Signed)
Stable on zestoretic. Recheck potassium as she discontinued her supplement.

## 2021-05-25 NOTE — Assessment & Plan Note (Signed)
Maintained on nolvadex per Oncology.

## 2021-05-25 NOTE — Assessment & Plan Note (Signed)
Tolerating lipitor 20mg , continue same. Obtain follow up lipid panel.

## 2021-05-25 NOTE — Assessment & Plan Note (Signed)
reclast discontinued. Continue caltrate and regular weight bearing exercise.

## 2021-05-25 NOTE — Assessment & Plan Note (Signed)
Stable on Detrol.

## 2021-05-25 NOTE — Patient Instructions (Signed)
Please get your Herbalist.

## 2021-05-25 NOTE — Progress Notes (Signed)
Subjective:     Patient ID: Terry Mcneil, female    DOB: 1940-12-12, 81 y.o.   MRN: [id number]  Chief Complaint  Patient presents with   Hypertension    Here for follow up   Hyperlipidemia    Here for follow up    HPI  Patient is in today for routine follow up visit.  HTN-maintained on zestoretic 20-59m. No longer taking Kdur BP Readings from Last 3 Encounters:  05/25/21 110/67  11/17/20 129/69  05/19/20 135/75   OAB- on Detrol LA. Reports symptoms are stable    Hyperlipidemia-   Osteopenia- she was being treated with reclast by her oncologist, however reports that she developed some bony growths in her mouth which her oral surgeon attributed to reclast. He advised discontinuation per patient.   Reports that she continue lipitor 253m    Lab Results  Component Value Date   CHOL 169 05/19/2020   HDL 64 05/19/2020   LDLCALC 85 05/19/2020   LDLDIRECT 78.0 08/24/2019   TRIG 102 05/19/2020   CHOLHDL 2.6 05/19/2020   Hx of breast cancer- maintained on tamoxifen. This is managed by Dr. KhHumphrey RollsOncology.   [facility]   Health Maintenance Due  Topic Date Due   Zoster Vaccines- Shingrix (1 of 2) Never done   COVID-19 Vaccine (3 - Pfizer risk series) 07/20/2019   TETANUS/TDAP  09/09/2020    Past Medical History:  Diagnosis Date   Arthritis    back   [place]   [place]   Cataract    Colon polyps    Hypertension    Osteopenia 10/11/2009   Patellar fracture    Right Knee    Urge incontinence     Past Surgical History:  Procedure Laterality Date   BREAST LUMPECTOMY Left 09/2016   COLONOSCOPY     EYE SURGERY Bilateral    Cataracts    HIP CLOSED REDUCTION Left 02/13/2019   Procedure: CLOSED MANIPULATION OF DISLOCATED HIP;  Surgeon: DuNewt MinionMD;  [place];  Laterality: Left;   ORIF PATELLA Right 08/28/2012    Procedure: OPEN REDUCTION INTERNAL (ORIF) FIXATION PATELLA- RIGHT;  Surgeon: MaNewt MinionMD;  [place] [facility] [place]: [place];  Laterality: Right;  Open Reduction Internal Fixation Right Patella   RADIOACTIVE SEED GUIDED PARTIAL MASTECTOMY WITH AXILLARY SENTINEL LYMPH NODE BIOPSY Left 10/03/2016   Procedure: LEFT BREAST RADIOACTIVE SEED GUIDED PARTIAL MASTECTOMY WITH LEFT SENTINEL LYMPH NODE MAPPING;  Surgeon: CoErroll LunaMD;  [place] [facility] Service: [place];  Laterality: Left;   TONSILLECTOMY     TOTAL HIP ARTHROPLASTY Left 01/13/2018   Procedure: LEFT TOTAL HIP ARTHROPLASTY ANTERIOR APPROACH;  Surgeon: BlMcarthur RossettiMD;  [place] [facility] Service: Orthopedics;  Laterality: Left;   TUBAL LIGATION     VAGINAL DELIVERY     x2 -     Family History  Problem Relation Age of Onset   Heart disease Father 9824     valve replacement    Social History   Socioeconomic History   Marital status: Married    Spouse name: RiChevi Lim Number of children: 2   Years of education: Not on file   Highest education level: Not on file  Occupational History   Occupation: Retired  Tobacco Use   Smoking status:  Former    Types: Cigarettes    Quit date: 08/27/1978    Years since quitting: 42.7   Smokeless tobacco: Never  Vaping Use   Vaping Use: Never used  Substance and Sexual Activity   Alcohol use: Yes    Alcohol/week: 7.0 standard drinks    Types: 7 Glasses of wine per week   Drug use: No   Sexual activity: Yes  Other Topics Concern   Not on file  Social History Narrative   Marital Status:  Married (Terry Mcneil) Husband has been diagnosed with Alzheimers.  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     Alcohol Use:  Moderate (Wine), 2 glasses 3 times a week   Drug Use:  None   Diet:  Weight Watchers    Exercise:  Not regular   Hobbies: Reading , Gardening , Grandchildren            Social Determinants of Health   Financial Resource Strain: Not on file  Food Insecurity: Not on file  Transportation Needs: Not on file  Physical Activity: Not on file  Stress: Not on file  Social Connections: Not on file  Intimate Partner Violence: Not on file    Outpatient Medications Prior to Visit  Medication Sig Dispense Refill   atorvastatin (LIPITOR) 20 MG tablet TAKE 1/2 TABLET BY MOUTH DAILY 45 tablet 0   calcium-vitamin D (OSCAL WITH D) 500-200 MG-UNIT per tablet Take 1 tablet by mouth daily.      lisinopril-hydrochlorothiazide (ZESTORETIC) 20-25 MG tablet TAKE ONE TABLET BY MOUTH DAILY 90 tablet 0   tolterodine (DETROL LA) 4 MG 24 hr capsule TAKE ONE CAPSULE BY MOUTH DAILY 90 capsule 1   potassium chloride (KLOR-CON) 10 MEQ tablet Take 1 tablet (10 mEq total) by mouth daily. 30 tablet 1   tamoxifen (NOLVADEX) 20 MG tablet Take 20 mg by mouth daily.      No facility-administered medications prior to visit.    Allergies  Allergen Reactions   Other Other (See Comments)    Sedation = BVM (??) per notes  "Pt with shallow respirations. Assisted by Rt with BVM"   Reclast [Zoledronic Acid]     Developed bony growths in her mouth.     ROS    See HPI  Objective:    Physical Exam Constitutional:      General: [name] not in acute distress.    Appearance: Normal appearance. [name] well-developed.  HENT:     Head: Normocephalic and atraumatic.     Right Ear: External ear normal.     Left Ear: External ear normal.  Eyes:     General: No scleral icterus. Neck:     Thyroid: No thyromegaly.  Cardiovascular:     Rate and Rhythm: Normal rate and regular rhythm.     Heart sounds: Normal heart sounds. No murmur heard. Pulmonary:     Effort: Pulmonary effort is normal. No  respiratory distress.     Breath sounds: Normal breath sounds. No wheezing.  Musculoskeletal:     Cervical back: Neck supple.  Skin:    General: Skin is warm and dry.  Neurological:     Mental Status: [name] alert and oriented to  person, place, and time.  Psychiatric:        Mood and Affect: Mood normal.        Behavior: Behavior normal.        Thought Content: Thought content normal.        Judgment: Judgment normal.    BP 110/67 (BP Location: Right Arm, Patient Position: Sitting, Cuff Size: Small)    Pulse (!) 110    Temp 97.7 F (36.5 C) (Oral)    Resp 16    Wt 147 lb (66.7 kg)    SpO2 98%    BMI 23.73 kg/m  Wt Readings from Last 3 Encounters:  05/25/21 147 lb (66.7 kg)  11/17/20 148 lb (67.1 kg)  05/19/20 154 lb (69.9 kg)       Assessment & Plan:   Problem List Items Addressed This Visit       Unprioritized   Overactive bladder    Stable on Detrol.        Osteopenia    reclast discontinued. Continue caltrate and regular weight bearing exercise.       Hyperlipidemia - Primary    Tolerating lipitor 81m, continue same. Obtain follow up lipid panel.       Relevant Orders   Comp Met (CMET)   Lipid panel   Essential hypertension, benign    Stable on zestoretic. Recheck potassium as she discontinued her supplement.       [place]    Maintained on nolvadex per Oncology.       Other Visit Diagnoses     Needs flu shot       Relevant Orders   Flu Vaccine QUAD High Dose(Fluad) (Completed)       I have discontinued BRise PaganiniS. Schlick's tamoxifen and potassium chloride. I am also having her start on tetanus & diphtheria toxoids (adult). Additionally, I am having her maintain her calcium-vitamin D, tolterodine, atorvastatin, and lisinopril-hydrochlorothiazide.  Meds ordered this encounter  Medications   tetanus & diphtheria toxoids, adult, (TENIVAC) 5-2 LFU injection    Sig: Inject 0.5 mLs into the muscle once for 1 dose.    Dispense:  0.5 mL     Refill:  0    Order Specific Question:   Supervising Provider    Answer:   BPenni HomansA [[id number]]

## 2021-05-26 LAB — COMPREHENSIVE METABOLIC PANEL
AG Ratio: 1.8 (calc) (ref 1.0–2.5)
ALT: 13 U/L (ref 6–29)
AST: 18 U/L (ref 10–35)
Albumin: 4.1 g/dL (ref 3.6–5.1)
Alkaline phosphatase (APISO): 65 U/L (ref 37–153)
BUN/Creatinine Ratio: 27 (calc) — ABNORMAL HIGH (ref 6–22)
BUN: 26 mg/dL — ABNORMAL HIGH (ref 7–25)
CO2: 28 mmol/L (ref 20–32)
Calcium: 11.9 mg/dL — ABNORMAL HIGH (ref 8.6–10.4)
Chloride: 100 mmol/L (ref 98–110)
Creat: 0.98 mg/dL — ABNORMAL HIGH (ref 0.60–0.95)
Globulin: 2.3 g/dL (calc) (ref 1.9–3.7)
Glucose, Bld: 110 mg/dL — ABNORMAL HIGH (ref 65–99)
Potassium: 3.6 mmol/L (ref 3.5–5.3)
Sodium: 139 mmol/L (ref 135–146)
Total Bilirubin: 0.6 mg/dL (ref 0.2–1.2)
Total Protein: 6.4 g/dL (ref 6.1–8.1)

## 2021-05-26 LAB — LIPID PANEL
Cholesterol: 162 mg/dL (ref <200)
HDL: 77 mg/dL (ref >=50)
LDL Cholesterol (Calc): 67 mg/dL (calc)
Non-HDL Cholesterol (Calc): 85 mg/dL (calc) (ref <130)
Total CHOL/HDL Ratio: 2.1 (calc) (ref <5.0)
Triglycerides: 101 mg/dL (ref <150)

## 2021-05-28 ENCOUNTER — Encounter: Payer: Self-pay | Admitting: Family

## 2021-05-29 NOTE — Progress Notes (Signed)
Mailed out to pt 

## 2021-06-27 ENCOUNTER — Observation Stay (HOSPITAL_COMMUNITY): Payer: Medicare PPO

## 2021-06-27 ENCOUNTER — Emergency Department (HOSPITAL_COMMUNITY): Payer: Medicare PPO

## 2021-06-27 ENCOUNTER — Other Ambulatory Visit: Payer: Self-pay

## 2021-06-27 ENCOUNTER — Encounter (HOSPITAL_COMMUNITY): Payer: Self-pay

## 2021-06-27 ENCOUNTER — Observation Stay (HOSPITAL_COMMUNITY)
Admission: EM | Admit: 2021-06-27 | Discharge: 2021-06-29 | Disposition: A | Discharge status: Home / Self Care | Payer: Medicare PPO | Attending: Internal Medicine | Admitting: Internal Medicine

## 2021-06-27 DIAGNOSIS — Z79899 Other long term (current) drug therapy: Secondary | ICD-10-CM | POA: Insufficient documentation

## 2021-06-27 DIAGNOSIS — R29818 Other symptoms and signs involving the nervous system: Secondary | ICD-10-CM | POA: Insufficient documentation

## 2021-06-27 DIAGNOSIS — S065X0A Traumatic subdural hemorrhage without loss of consciousness, initial encounter: Secondary | ICD-10-CM | POA: Insufficient documentation

## 2021-06-27 DIAGNOSIS — R4702 Dysphasia: Secondary | ICD-10-CM | POA: Diagnosis not present

## 2021-06-27 DIAGNOSIS — Y92009 Unspecified place in unspecified non-institutional (private) residence as the place of occurrence of the external cause: Secondary | ICD-10-CM | POA: Insufficient documentation

## 2021-06-27 DIAGNOSIS — I639 Cerebral infarction, unspecified: Secondary | ICD-10-CM

## 2021-06-27 DIAGNOSIS — E785 Hyperlipidemia, unspecified: Secondary | ICD-10-CM | POA: Diagnosis not present

## 2021-06-27 DIAGNOSIS — S0291XA Unspecified fracture of skull, initial encounter for closed fracture: Secondary | ICD-10-CM | POA: Diagnosis present

## 2021-06-27 DIAGNOSIS — Z853 Personal history of malignant neoplasm of breast: Secondary | ICD-10-CM | POA: Insufficient documentation

## 2021-06-27 DIAGNOSIS — S02119A Unspecified fracture of occiput, initial encounter for closed fracture: Secondary | ICD-10-CM | POA: Diagnosis not present

## 2021-06-27 DIAGNOSIS — Z87891 Personal history of nicotine dependence: Secondary | ICD-10-CM | POA: Insufficient documentation

## 2021-06-27 DIAGNOSIS — F0781 Postconcussional syndrome: Secondary | ICD-10-CM | POA: Diagnosis not present

## 2021-06-27 DIAGNOSIS — I6789 Other cerebrovascular disease: Secondary | ICD-10-CM | POA: Insufficient documentation

## 2021-06-27 DIAGNOSIS — I10 Essential (primary) hypertension: Secondary | ICD-10-CM | POA: Diagnosis not present

## 2021-06-27 DIAGNOSIS — Z20822 Contact with and (suspected) exposure to covid-19: Secondary | ICD-10-CM | POA: Insufficient documentation

## 2021-06-27 DIAGNOSIS — W19XXXA Unspecified fall, initial encounter: Secondary | ICD-10-CM | POA: Diagnosis not present

## 2021-06-27 DIAGNOSIS — R4182 Altered mental status, unspecified: Secondary | ICD-10-CM | POA: Diagnosis not present

## 2021-06-27 DIAGNOSIS — D72829 Elevated white blood cell count, unspecified: Secondary | ICD-10-CM | POA: Diagnosis not present

## 2021-06-27 DIAGNOSIS — S065XAA Traumatic subdural hemorrhage with loss of consciousness status unknown, initial encounter: Secondary | ICD-10-CM | POA: Diagnosis not present

## 2021-06-27 DIAGNOSIS — Z96642 Presence of left artificial hip joint: Secondary | ICD-10-CM | POA: Diagnosis not present

## 2021-06-27 DIAGNOSIS — R000 Tachycardia, unspecified: Secondary | ICD-10-CM | POA: Diagnosis not present

## 2021-06-27 DIAGNOSIS — R4701 Aphasia: Secondary | ICD-10-CM | POA: Diagnosis not present

## 2021-06-27 DIAGNOSIS — J449 Chronic obstructive pulmonary disease, unspecified: Secondary | ICD-10-CM | POA: Diagnosis not present

## 2021-06-27 DIAGNOSIS — S0990XA Unspecified injury of head, initial encounter: Secondary | ICD-10-CM | POA: Diagnosis not present

## 2021-06-27 DIAGNOSIS — Z170 Estrogen receptor positive status [ER+]: Secondary | ICD-10-CM

## 2021-06-27 DIAGNOSIS — R410 Disorientation, unspecified: Secondary | ICD-10-CM | POA: Diagnosis not present

## 2021-06-27 DIAGNOSIS — R457 State of emotional shock and stress, unspecified: Secondary | ICD-10-CM | POA: Diagnosis not present

## 2021-06-27 DIAGNOSIS — C50412 Malignant neoplasm of upper-outer quadrant of left female breast: Secondary | ICD-10-CM

## 2021-06-27 HISTORY — DX: Unspecified fracture of skull, initial encounter for closed fracture: S02.91XA

## 2021-06-27 HISTORY — DX: Traumatic subdural hemorrhage with loss of consciousness status unknown, initial encounter: S06.5XAA

## 2021-06-27 LAB — COMPREHENSIVE METABOLIC PANEL
ALT: 22 U/L (ref 0–44)
AST: 37 U/L (ref 15–41)
Albumin: 3.9 g/dL (ref 3.5–5.0)
Alkaline Phosphatase: 69 U/L (ref 38–126)
Anion gap: 17 — ABNORMAL HIGH (ref 5–15)
BUN: 28 mg/dL — ABNORMAL HIGH (ref 8–23)
CO2: 20 mmol/L — ABNORMAL LOW (ref 22–32)
Calcium: 12 mg/dL — ABNORMAL HIGH (ref 8.9–10.3)
Chloride: 100 mmol/L (ref 98–111)
Creatinine, Ser: 1.16 mg/dL — ABNORMAL HIGH (ref 0.44–1.00)
GFR, Estimated: 47 mL/min — ABNORMAL LOW (ref >60)
Glucose, Bld: 143 mg/dL — ABNORMAL HIGH (ref 70–99)
Potassium: 3.3 mmol/L — ABNORMAL LOW (ref 3.5–5.1)
Sodium: 137 mmol/L (ref 135–145)
Total Bilirubin: 0.8 mg/dL (ref 0.3–1.2)
Total Protein: 6.5 g/dL (ref 6.5–8.1)

## 2021-06-27 LAB — RESP PANEL BY RT-PCR (FLU A&B, COVID) ARPGX2
Influenza A by PCR: NEGATIVE
Influenza B by PCR: NEGATIVE
SARS Coronavirus 2 by RT PCR: NEGATIVE

## 2021-06-27 LAB — LIPID PANEL
Cholesterol: 149 mg/dL (ref 0–200)
HDL: 77 mg/dL (ref >40)
LDL Cholesterol: 59 mg/dL (ref 0–99)
Total CHOL/HDL Ratio: 1.9 RATIO
Triglycerides: 63 mg/dL (ref <150)
VLDL: 13 mg/dL (ref 0–40)

## 2021-06-27 LAB — CBC WITH DIFFERENTIAL/PLATELET
Abs Immature Granulocytes: 0.19 10*3/uL — ABNORMAL HIGH (ref 0.00–0.07)
Basophils Absolute: 0 10*3/uL (ref 0.0–0.1)
Basophils Relative: 0 %
Eosinophils Absolute: 0 10*3/uL (ref 0.0–0.5)
Eosinophils Relative: 0 %
HCT: 39 % (ref 36.0–46.0)
Hemoglobin: 13.2 g/dL (ref 12.0–15.0)
Immature Granulocytes: 1 %
Lymphocytes Relative: 3 %
Lymphs Abs: 0.7 10*3/uL (ref 0.7–4.0)
MCH: 32.2 pg (ref 26.0–34.0)
MCHC: 33.8 g/dL (ref 30.0–36.0)
MCV: 95.1 fL (ref 80.0–100.0)
Monocytes Absolute: 1.2 10*3/uL — ABNORMAL HIGH (ref 0.1–1.0)
Monocytes Relative: 6 %
Neutro Abs: 18 10*3/uL — ABNORMAL HIGH (ref 1.7–7.7)
Neutrophils Relative %: 90 %
Platelets: 252 10*3/uL (ref 150–400)
RBC: 4.1 MIL/uL (ref 3.87–5.11)
RDW: 13.2 % (ref 11.5–15.5)
WBC: 20.1 10*3/uL — ABNORMAL HIGH (ref 4.0–10.5)
nRBC: 0 % (ref 0.0–0.2)

## 2021-06-27 LAB — CBG MONITORING, ED: Glucose-Capillary: 139 mg/dL — ABNORMAL HIGH (ref 70–99)

## 2021-06-27 LAB — I-STAT CHEM 8, ED
BUN: 29 mg/dL — ABNORMAL HIGH (ref 8–23)
Calcium, Ion: 1.36 mmol/L (ref 1.15–1.40)
Chloride: 103 mmol/L (ref 98–111)
Creatinine, Ser: 1.1 mg/dL — ABNORMAL HIGH (ref 0.44–1.00)
Glucose, Bld: 144 mg/dL — ABNORMAL HIGH (ref 70–99)
HCT: 40 % (ref 36.0–46.0)
Hemoglobin: 13.6 g/dL (ref 12.0–15.0)
Potassium: 3.3 mmol/L — ABNORMAL LOW (ref 3.5–5.1)
Sodium: 136 mmol/L (ref 135–145)
TCO2: 25 mmol/L (ref 22–32)

## 2021-06-27 LAB — PROTIME-INR
INR: 1 (ref 0.8–1.2)
Prothrombin Time: 13.3 seconds (ref 11.4–15.2)

## 2021-06-27 LAB — HEMOGLOBIN A1C
Hgb A1c MFr Bld: 5.2 % (ref 4.8–5.6)
Mean Plasma Glucose: 102.54 mg/dL

## 2021-06-27 LAB — APTT: aPTT: 28 seconds (ref 24–36)

## 2021-06-27 LAB — ETHANOL: Alcohol, Ethyl (B): <10 mg/dL (ref <10)

## 2021-06-27 MED ORDER — STROKE: EARLY STAGES OF RECOVERY BOOK
Freq: Once | Status: AC
  Filled 2021-06-27: qty 1

## 2021-06-27 MED ORDER — ACETAMINOPHEN 325 MG PO TABS
650.0000 mg | ORAL_TABLET | ORAL | Status: DC | PRN
  Administered 2021-06-28 – 2021-06-29 (×4): 650 mg via ORAL
  Filled 2021-06-27 (×4): qty 2

## 2021-06-27 MED ORDER — POTASSIUM CHLORIDE 10 MEQ/100ML IV SOLN
10.0000 meq | INTRAVENOUS | Status: AC
  Administered 2021-06-27 – 2021-06-28 (×2): 10 meq via INTRAVENOUS
  Filled 2021-06-27 (×2): qty 100

## 2021-06-27 MED ORDER — SODIUM CHLORIDE 0.9 % IV SOLN: INTRAVENOUS | Status: DC

## 2021-06-27 MED ORDER — ACETAMINOPHEN 650 MG RE SUPP: 650.0000 mg | RECTAL | Status: DC | PRN

## 2021-06-27 MED ORDER — ATORVASTATIN CALCIUM 10 MG PO TABS
10.0000 mg | ORAL_TABLET | Freq: Every day | ORAL | Status: DC
  Administered 2021-06-28 – 2021-06-29 (×2): 10 mg via ORAL
  Filled 2021-06-27 (×2): qty 1

## 2021-06-27 MED ORDER — ACETAMINOPHEN 160 MG/5ML PO SOLN: 650.0000 mg | ORAL | Status: DC | PRN

## 2021-06-27 MED ORDER — SENNOSIDES-DOCUSATE SODIUM 8.6-50 MG PO TABS: 1.0000 | ORAL_TABLET | Freq: Every evening | ORAL | Status: DC | PRN

## 2021-06-27 NOTE — ED Notes (Signed)
Patient transported to MRI 

## 2021-06-27 NOTE — Code Documentation (Addendum)
Stroke Response Nurse Documentation Code Documentation  Terry Mcneil is a 81 y.o. female arriving to [facility] ED via [facility] on 06/27/21 with past medical hx of breast cancer, hyperlipidemia, HTN. On No antithrombotic. Code stroke was activated by [facility].   Patient from home where she was LKW at 1500 and now complaining of aphasia and dysarthria. Patient dropped her husband off at the [facility] at 1500 in her normal state of health. When he got home around 1725 he found her down on the concrete floor of the basement. She did not know how long she had been down. She has a large bump on the back of her head. [facility] arrived and activated the code stroke after pt found aphasic.   Stroke team at the bedside on patient arrival. Labs drawn and patient cleared for CT by Dr. Tyrone Nine. Patient to CT with team. NIHSS 3, see documentation for details and code stroke times. Patient with Expressive aphasia  and dysarthria  on exam. The following imaging was completed:  CT head. Patient is not a candidate for IV Thrombolytic due to subdural hematoma found on CT scan per MD. Patient is not a candidate for IR due to no LVO suspected. Pt's husband at the bedside. He had to be helped into the room by wheelchair. He says he is not leaving her. Concern for not being able to help him while he is here. We attempted to call patient's son but there was no answer.   "IMPRESSION: 1. Negative for acute infarct 2. ASPECTS is 10 3. Right occipital nondisplaced skull fracture. Mild subdural hematoma along the left tentorium. 4. These results were called by telephone at the time of interpretation on 06/27/2021 at 6:33 pm to provider Palikh, who verbally acknowledged these results."  Care/Plan: q1 vitals/neuro/pupil checks, BP<160.   Bedside handoff with ED RN Lysbeth Galas.    Sharion Grieves, Rande Brunt  Stroke Response RN

## 2021-06-27 NOTE — Assessment & Plan Note (Signed)
No clear infectious source.  Presumed reactive in setting of acute head injury.

## 2021-06-27 NOTE — Assessment & Plan Note (Signed)
Calcium 12.0 on admission, similar to labs from 1 month ago but previously normal. -Start IV fluid hydration with normal saline overnight -Hold HCTZ and calcium supplement

## 2021-06-27 NOTE — Assessment & Plan Note (Signed)
Holding antihypertensives for now.

## 2021-06-27 NOTE — ED Provider Notes (Signed)
[facility] EMERGENCY DEPARTMENT Provider Note   CSN: [id number] Arrival date & time: 06/27/21  1811  An emergency department physician performed an initial assessment on this suspected stroke patient at 1811.  History  No chief complaint on file.   Terry Mcneil is a 81 y.o. female.  81 yo F with a chief complaint of being found confused and having difficulty with speech.  This started just prior to arrival.  She was found on the ground.  Activated as a code stroke in route with aphasia.       Home Medications Prior to Admission medications   Medication Sig Start Date End Date Taking? Authorizing Provider  atorvastatin (LIPITOR) 20 MG tablet TAKE 1/2 TABLET BY MOUTH DAILY 05/16/21   [name], NP  calcium-vitamin D (OSCAL WITH D) 500-200 MG-UNIT per tablet Take 1 tablet by mouth daily.     [provider]  lisinopril-hydrochlorothiazide (ZESTORETIC) 20-25 MG tablet TAKE ONE TABLET BY MOUTH DAILY 05/16/21   [name], NP  tolterodine (DETROL LA) 4 MG 24 hr capsule TAKE ONE CAPSULE BY MOUTH DAILY 04/25/21   [name], NP      Allergies    Other and Reclast [zoledronic acid]    Review of Systems   Review of Systems  Physical Exam Updated Vital Signs BP (!) 144/80    Pulse (!) 105    Temp (!) 97 F (36.1 C) (Oral)    Resp 17    Wt 66 kg    SpO2 93%    BMI 23.48 kg/m  Physical Exam Vitals and nursing note reviewed.  Constitutional:      General: [name] not in acute distress.    Appearance: [name] well-developed. [name] not diaphoretic.  HENT:     Head: Normocephalic and atraumatic.  Eyes:     Pupils: Pupils are equal, round, and reactive to light.  Cardiovascular:     Rate and Rhythm: Normal rate and regular rhythm.     Heart sounds: No murmur heard.   No friction rub. No gallop.  Pulmonary:     Effort: Pulmonary effort is normal.     Breath sounds: No wheezing or rales.  Abdominal:     General: There is no  distension.     Palpations: Abdomen is soft.     Tenderness: There is no abdominal tenderness.  Musculoskeletal:        General: No tenderness.     Cervical back: Normal range of motion and neck supple.  Skin:    General: Skin is warm and dry.  Neurological:     Mental Status: [name] alert and oriented to person, place, and time.     Comments: Aphasia.  No obvious extremity weakness.  Psychiatric:        Behavior: Behavior normal.    ED Results / Procedures / Treatments   Labs (all labs ordered are listed, but only abnormal results are displayed) Labs Reviewed  CBC WITH DIFFERENTIAL/PLATELET - Abnormal; Notable for the following components:      Result Value   WBC 20.1 (*)    Neutro Abs 18.0 (*)    Monocytes Absolute 1.2 (*)    Abs Immature Granulocytes 0.19 (*)    All other components within normal limits  CBG MONITORING, ED - Abnormal; Notable for the following components:   Glucose-Capillary 139 (*)    All other components within normal limits  I-STAT CHEM 8, ED - Abnormal; Notable for the following  components:   Potassium 3.3 (*)    BUN 29 (*)    Creatinine, Ser 1.10 (*)    Glucose, Bld 144 (*)    All other components within normal limits  RESP PANEL BY RT-PCR (FLU A&B, COVID) ARPGX2  ETHANOL  PROTIME-INR  APTT  HEMOGLOBIN A1C  COMPREHENSIVE METABOLIC PANEL  RAPID URINE DRUG SCREEN, HOSP PERFORMED  URINALYSIS, ROUTINE W REFLEX MICROSCOPIC  LIPID PANEL  I-STAT CHEM 8, ED    EKG EKG Interpretation  Date/Time:  Wednesday June 27 2021 18:55:46 EST Ventricular Rate:  108 PR Interval:  138 QRS Duration: 100 QT Interval:  336 QTC Calculation: 451 R Axis:   59 Text Interpretation: Sinus tachycardia Ventricular premature complex Low voltage, precordial leads No significant change since last tracing Confirmed by Deno Etienne [phone]) on 06/27/2021 7:14:50 PM  Radiology CT HEAD CODE STROKE WO CONTRAST  Result Date: 06/27/2021 CLINICAL DATA:  Code stroke. Acute  neuro deficit. Expressive aphasia. Fall with head injury. EXAM: CT HEAD WITHOUT CONTRAST TECHNIQUE: Contiguous axial images were obtained from the base of the skull through the vertex without intravenous contrast. RADIATION DOSE REDUCTION: This exam was performed according to the departmental dose-optimization program which includes automated exposure control, adjustment of the mA and/or kV according to patient size and/or use of iterative reconstruction technique. COMPARISON:  None. FINDINGS: Brain: Generalized atrophy. Mild white matter hypodensity bilaterally appears chronic. No acute infarct or mass Asymmetric thickening and hyperdensity of the left tentorium. This appears to represent a small acute subdural hematoma. No subarachnoid hemorrhage. Vascular: Negative for hyperdense vessel Skull: Right occipital skull fracture, nondisplaced. Posterior scalp contusion. Sinuses/Orbits: Mild mucosal edema paranasal sinuses. Bilateral cataract extraction Other: None ASPECTS ([place] Stroke Program Early CT Score) - Ganglionic level infarction (caudate, lentiform nuclei, internal capsule, insula, M1-M3 cortex): 7 - Supraganglionic infarction (M4-M6 cortex): 3 Total score (0-10 with 10 being normal): 10 IMPRESSION: 1. Negative for acute infarct 2. ASPECTS is 10 3. Right occipital nondisplaced skull fracture. Mild subdural hematoma along the left tentorium. 4. These results were called by telephone at the time of interpretation on 06/27/2021 at 6:33 pm to provider Palikh, who verbally acknowledged these results. Electronically Signed   By: Franchot Gallo M.D.   On: 06/27/2021 18:35    Procedures Procedures    Medications Ordered in ED Medications - No data to display  ED Course/ Medical Decision Making/ A&P                           Medical Decision Making Amount and/or Complexity of Data Reviewed Labs: ordered. Radiology: ordered.   81 yo F with a chief complaints of acute onset difficulty with speech.   This was noticed when she was found on the ground.  Arrived here as a code stroke.  CT scan of the head concerning for a small subdural hematoma.  I discussed the case with neurosurgery felt that this was unlikely to cause her symptoms and recommended further work-up.  They felt that the subdural would be unlikely to require intervention but felt with neurologic symptoms repeat head CT in the morning would not be unreasonable.  The patient's calcium has come back a bit elevated.  [name] on a thiazide diuretic, also was on medication per the family that was making her have increased bone growth in her jaw that was recently stopped.  Will discuss with medicine.   CRITICAL CARE Performed by: Cecilio Asper   Total critical  care time: 35 minutes  Critical care time was exclusive of separately billable procedures and treating other patients.  Critical care was necessary to treat or prevent imminent or life-threatening deterioration.  Critical care was time spent personally by me on the following activities: development of treatment plan with patient and/or surrogate as well as nursing, discussions with consultants, evaluation of patient's response to treatment, examination of patient, obtaining history from patient or surrogate, ordering and performing treatments and interventions, ordering and review of laboratory studies, ordering and review of radiographic studies, pulse oximetry and re-evaluation of patient's condition.  The patients results and plan were reviewed and discussed.   Any x-rays performed were independently reviewed by myself.   Differential diagnosis were considered with the presenting HPI.  Medications - No data to display  Vitals:   06/27/21 1830 06/27/21 1854 06/27/21 1900 06/27/21 1915  BP: (!) 146/71 133/64 (!) 146/81 (!) 144/80  Pulse:  (!) 108 (!) 107 (!) 105  Resp:  20 18 17   Temp:  (!) 97 F (36.1 C)    TempSrc:  Oral    SpO2:  94% 93% 93%  Weight:         Final diagnoses:  Aphasia    Admission/ observation were discussed with the admitting physician, patient and/or family and they are comfortable with the plan.         Final Clinical Impression(s) / ED Diagnoses Final diagnoses:  Aphasia    Rx / DC Orders ED Discharge Orders     None         Deno Etienne, DO 06/27/21 2128

## 2021-06-27 NOTE — Assessment & Plan Note (Signed)
CT head negative for acute infarct, mild subdural hematoma along the left tentorium and right occipital nondisplaced skull fracture noted. -Neurology following -MRI brain and MRA head pending -Echocardiogram -Carotid Dopplers -Hold antiplatelets in setting of SDH -Monitor on telemetry, continue neurochecks -PT/OT/SLP eval -EEG per neurology

## 2021-06-27 NOTE — Consult Note (Addendum)
Neurology Consultation  Reason for Consult: Code Stroke  Referring Physician: Dr. Tyrone Nine  CC: Speech disturbance   History is obtained from:EMS, Chart review, patient's husband, unable to obtain from patient due to aphasia  HPI: VIANEY Mcneil is a 81 y.o. female with a medical history significant for breast cancer, hyperlipidemia, and essential hypertension who presented to the ED on 2/15 via EMS for evaluation of aphasia.  Patient's husband states that she drove him to an activity at approximately 3:00 this afternoon and was in her usual state of health.  He had a ride home from a friend and around 5:00, he found Terry Mcneil laying on the cement floor of their basement unable to communicate normally.  EMS noted patient with aphasia and activated a code stroke.  At baseline Terry Mcneil ambulates without assistive device, completes all of her ADLs independently, and is still able to drive.  LKW: 15:00 2/15 TNK given?: no, small subdural identified on [facility] imaging IR Thrombectomy? No, presentation is felt to be related to SDH and not consistent LVO without unilateral weakness.  Modified Rankin Scale: 0-Completely asymptomatic and back to baseline post- stroke  ROS: Unable to obtain due to aphasia.  Past Medical History:  Diagnosis Date   Arthritis    back   Cancer ([place]) 09/2016   left breast cancer   Cataract    Colon polyps    Hypertension    Osteopenia 10/11/2009   Patellar fracture    Right Knee    Urge incontinence    Past Surgical History:  Procedure Laterality Date   BREAST LUMPECTOMY Left 09/2016   COLONOSCOPY     EYE SURGERY Bilateral    Cataracts    HIP CLOSED REDUCTION Left 02/13/2019   Procedure: CLOSED MANIPULATION OF DISLOCATED HIP;  Surgeon: Newt Minion, MD;  Location: [facility];  Service: Orthopedics;  Laterality: Left;   ORIF PATELLA Right 08/28/2012   Procedure: OPEN REDUCTION INTERNAL (ORIF) FIXATION PATELLA- RIGHT;  Surgeon: Newt Minion, MD;  Location: [facility];  Service:  Orthopedics;  Laterality: Right;  Open Reduction Internal Fixation Right [place]   RADIOACTIVE SEED GUIDED PARTIAL MASTECTOMY WITH AXILLARY SENTINEL LYMPH NODE BIOPSY Left 10/03/2016   Procedure: LEFT BREAST RADIOACTIVE SEED GUIDED PARTIAL MASTECTOMY WITH LEFT SENTINEL LYMPH NODE MAPPING;  Surgeon: Erroll Luna, MD;  Location: [facility];  Service: General;  Laterality: Left;   TONSILLECTOMY     TOTAL HIP ARTHROPLASTY Left 01/13/2018   Procedure: LEFT TOTAL HIP ARTHROPLASTY ANTERIOR APPROACH;  Surgeon: Mcarthur Rossetti, MD;  Location: [place];  Service: Orthopedics;  Laterality: Left;   TUBAL LIGATION     VAGINAL DELIVERY     x2 -    Family History  Problem Relation Age of Onset   Heart disease Father 13       valve replacement   Social History:   reports that she quit smoking about 42 years ago. She has never used smokeless tobacco. She reports current alcohol use of about 7.0 standard drinks per week. She reports that she does not use drugs.  Medications No current facility-administered medications for this encounter.  Current Outpatient Medications:    atorvastatin (LIPITOR) 20 MG tablet, TAKE 1/2 TABLET BY MOUTH DAILY, Disp: 45 tablet, Rfl: 0   calcium-vitamin D (OSCAL WITH D) 500-200 MG-UNIT per tablet, Take 1 tablet by mouth daily. , Disp: , Rfl:    lisinopril-hydrochlorothiazide (ZESTORETIC) 20-25 MG tablet, TAKE ONE TABLET BY MOUTH DAILY, Disp: 90 tablet, Rfl:  0   tolterodine (DETROL LA) 4 MG 24 hr capsule, TAKE ONE CAPSULE BY MOUTH DAILY, Disp: 90 capsule, Rfl: 1  Exam: Current vital signs: Wt 66 kg    BMI 23.48 kg/m  Vital signs in last 24 hours: Weight:  [66 kg] 66 kg (02/15 1500)  GENERAL: Awake, alert, in no acute distress Psych: Affect appropriate for situation, patient is calm and cooperative with examination Head: Normocephalic and atraumatic, without obvious abnormality, area of edema on posterior head EENT: Normal conjunctivae, dry mucous  membranes, no OP obstruction LUNGS: Normal respiratory effort. Non-labored breathing on room air CV: Tachycardia on telemetry ABDOMEN: Soft, non-tender, non-distended Extremities: Warm, well perfused, without obvious deformity  NEURO:  Mental Status: Awake, alert, and unable to answer orientation questions.  Patient is able to state her name briefly and is able to state her husband's name.  [name] unable to answer further orientation questions.  [name] unable to provide a clear or coherent history of present illness due to aphasia. She has mildly dysarthric and speech is nonfluent.  [name] unable to name objects or repeat phrases. No neglect is noted. Patient follows commands without difficulty. Cranial Nerves:  II: PERRL. Visual fields full.  III, IV, VI: EOMI without gaze preference, ptosis. V: Sensation is intact to light touch and symmetrical to face.  VII: Face is symmetric resting and smiling.  VIII: Hearing is intact to voice IX, X: Palate elevation is symmetric. Phonation normal.  XI: Normal sternocleidomastoid and trapezius muscle strength XII: Tongue protrudes midline without fasciculations.   Motor: 5/5 strength is all muscle groups without vertical drift or asymmetry. Tone is normal. Bulk is normal.  Sensation: Intact to light touch bilaterally in all four extremities. No extinction to DSS present.  Coordination: FTN intact bilaterally. HKS intact bilaterally. No pronator drift.  Gait: Deferred  NIHSS: Best Language: 2 Dysarthria: 1 TOTAL: 3  Labs I have reviewed labs in epic and the results pertinent to this consultation are: CBC    Component Value Date/Time   WBC 9.8 02/12/2019 1617   RBC 3.92 02/12/2019 1617   HGB 13.6 06/27/2021 1820   HCT 40.0 06/27/2021 1820   PLT 187 02/12/2019 1617   MCV 98.0 02/12/2019 1617   MCH 32.4 02/12/2019 1617   MCHC 33.1 02/12/2019 1617   RDW 13.1 02/12/2019 1617   LYMPHSABS 0.7 02/12/2019 1617   MONOABS 0.5 02/12/2019 1617    EOSABS 0.0 02/12/2019 1617   BASOSABS 0.0 02/12/2019 1617   CMP     Component Value Date/Time   NA 136 06/27/2021 1820   K 3.3 (L) 06/27/2021 1820   CL 103 06/27/2021 1820   CO2 28 05/25/2021 1533   GLUCOSE 144 (H) 06/27/2021 1820   BUN 29 (H) 06/27/2021 1820   CREATININE 1.10 (H) 06/27/2021 1820   CREATININE 0.98 (H) 05/25/2021 1533   CALCIUM 11.9 (H) 05/25/2021 1533   PROT 6.4 05/25/2021 1533   ALBUMIN 4.1 08/24/2019 0835   AST 18 05/25/2021 1533   ALT 13 05/25/2021 1533   ALKPHOS 59 08/24/2019 0835   BILITOT 0.6 05/25/2021 1533   GFRNONAA >60 02/12/2019 1617   GFRNONAA 88 10/08/2012 0811   GFRAA >60 02/12/2019 1617   GFRAA >89 10/08/2012 0811   Lipid Panel     Component Value Date/Time   CHOL 162 05/25/2021 1533   TRIG 101 05/25/2021 1533   HDL 77 05/25/2021 1533   CHOLHDL 2.1 05/25/2021 1533   VLDL 41.4 (H) 08/24/2019 5366  LDLCALC 67 05/25/2021 1533   LDLDIRECT 78.0 08/24/2019 0835   Lab Results  Component Value Date   HGBA1C 5.5 05/19/2020   Imaging I have reviewed the images obtained:  CT-scan of the brain 2/15: 1. Negative for acute infarct 2. ASPECTS is 10 3. Right occipital nondisplaced skull fracture. Mild subdural hematoma along the left tentorium. 4. These results were called by telephone at the time of interpretation on 06/27/2021 at 6:33 pm to provider Tasnia Spegal, who verbally acknowledged these results  Assessment: 81 year old female who presented to the ED via EMS on 2/15 after sustaining an unwitnessed fall on concrete flooring for evaluation of aphasia.  Last seen normal by her husband at 15:00 today. - Examination reveals patient with expressive aphasia and mild dysarthria without extremity weakness.  NIHSS of 3. - Imaging reveals a right occipital nondisplaced skull fracture with a mild subdural hematoma along the left tentorium. - Patient is not a candidate for TNK due to acute subdural on CT imaging.  - Presentation is concerning for an  acute ischemic stroke though she is not a candidate for TNK due to recent head trauma. Will complete stroke work up.  - Stroke risk factors include patient's advanced age, history of HLD and HTN.  Concern for seizure given fall.   Recommendations: -Monitor for seizures -Routine EEG(ordered) hold on prophylactic AEDs for now. - HgbA1c, fasting lipid panel - MRI, MRA of the brain without contrast - Frequent neuro checks - Echocardiogram - Carotid dopplers - Prophylactic therapy- Hold in the setting of SDH - Maintain SBP < 160 mmHg - Risk factor modification - Telemetry monitoring - PT consult, OT consult, Speech consult - Stroke team to follow  Pt seen by NP/Neuro and later by MD. Note/plan to be edited by MD as needed.  Anibal Henderson, AGAC-NP [facility] Pager: [phone]  ATTENDING ATTESTATION:  Pt with expressive aphasia found to have unwitnessed fall and a a result small SDH on CT. Not TNK candidate. Will workup for seizure with EEG. Discussed with ED physician and stroke team/nurse.  Dr. Reeves Forth evaluated pt independently, reviewed imaging, chart, labs. Discussed and formulated plan with the APP. Please see APP note above for details.      This patient is critically ill due to respiratory distress, stroke s/p tPA and at significant risk of neurological worsening, death form heart failure, respiratory failure, recurrent stroke, bleeding from [facility], seizure, sepsis. This patient's care requires constant monitoring of vital signs, hemodynamics, respiratory and cardiac monitoring, review of multiple databases, neurological assessment, discussion with family, other specialists and medical decision making of high complexity. I spent 40 minutes of neurocritical care time in the care of this patient.   Charls Custer,MD

## 2021-06-27 NOTE — H&P (Signed)
History and Physical    Terry Mcneil ZOX:[id number] DOB: 08/31/1940 DOA: 06/27/2021  PCP: [name], NP  Patient coming from: Home via EMS  I have personally briefly reviewed patient's old medical records in [place]  Chief Complaint: Aphasia  HPI: Terry Mcneil is a 81 y.o. female with medical history significant for left breast cancer (s/p lumpectomy, chemotherapy, radiation), HTN, HLD who presented to the ED for evaluation of aphasia.  History is limited from patient due to expressive aphasia and is otherwise supplemented by EDP, chart review, and family at bedside.  Patient last known normal around 1500 on 06/27/2021.  Patient had driven her husband to an activity at that time and she was in her usual state of health.  Husband was driven home by a friend around 6.  On his arrival he found the patient laying on the cement floor of the garage unable to communicate normally.    EMS were called and she was noted to have aphasia and code stroke was activated.  She was subsequently brought to the ED for further evaluation.  Per family, patient ambulates well on her own at baseline.  [name] able to complete all IADLs and communicates fluently.  ED Course   Labs/Imaging on admission: I have personally reviewed following labs and imaging studies.  Initial vitals showed BP 146/81, pulse 107, RR 18, temp 97.0 F, SPO2 93% on room air.  Labs show sodium 137, potassium 3.3, bicarb 20, BUN 28, creatinine 1.16, calcium 12.0, albumin 3.9, LFTs within normal limits, WBC 20.1, hemoglobin 13.2, platelets 252,000, serum ethanol <10, INR 1.0.  CT head without contrast was negative for acute infarct.  Right occipital nondisplaced skull fracture and mild subdural hematoma along the left tentorium noted.  Portable chest x-ray showed hyperinflated lung fields and Port-A-Cath tip in the SVC near the cavoatrial junction.  Progression of mild bibasilar airspace disease noted.  Neurology  consulted and recommended medical admission for further work-up.  EDP discussed with on-call neurosurgery who did not feel there is need for neurosurgical intervention at this time.  The hospitalist service was consulted to admit for further evaluation and management.  Review of Systems: All systems reviewed and are negative except as documented in history of present illness above.   Past Medical History:  Diagnosis Date   Arthritis    back   Cancer ([place]) 09/2016   left breast cancer   Cataract    Colon polyps    Hypertension    Osteopenia 10/11/2009   Patellar fracture    Right Knee    Urge incontinence     Past Surgical History:  Procedure Laterality Date   BREAST LUMPECTOMY Left 09/2016   COLONOSCOPY     EYE SURGERY Bilateral    Cataracts    HIP CLOSED REDUCTION Left 02/13/2019   Procedure: CLOSED MANIPULATION OF DISLOCATED HIP;  Surgeon: Newt Minion, MD;  Location: [facility];  Service: Orthopedics;  Laterality: Left;   ORIF PATELLA Right 08/28/2012   Procedure: OPEN REDUCTION INTERNAL (ORIF) FIXATION PATELLA- RIGHT;  Surgeon: Newt Minion, MD;  Location: [facility];  Service: Orthopedics;  Laterality: Right;  Open Reduction Internal Fixation Right [place]   RADIOACTIVE SEED GUIDED PARTIAL MASTECTOMY WITH AXILLARY SENTINEL LYMPH NODE BIOPSY Left 10/03/2016   Procedure: LEFT BREAST RADIOACTIVE SEED GUIDED PARTIAL MASTECTOMY WITH LEFT SENTINEL LYMPH NODE MAPPING;  Surgeon: Erroll Luna, MD;  Location: [facility];  Service: General;  Laterality: Left;   TONSILLECTOMY  TOTAL HIP ARTHROPLASTY Left 01/13/2018   Procedure: LEFT TOTAL HIP ARTHROPLASTY ANTERIOR APPROACH;  Surgeon: Mcarthur Rossetti, MD;  Location: [facility];  Service: Orthopedics;  Laterality: Left;   TUBAL LIGATION     VAGINAL DELIVERY     x2 -     Social History:  reports that she quit smoking about 42 years ago. She has never used smokeless tobacco. She reports current alcohol use of about 7.0  standard drinks per week. She reports that she does not use drugs.  Allergies  Allergen Reactions   Other Other (See Comments)    Sedation = BVM (??) per notes  "Pt with shallow respirations. Assisted by Rt with BVM"   Reclast [Zoledronic Acid]     Developed bony growths in her mouth.     Family History  Problem Relation Age of Onset   Heart disease Father 35       valve replacement     Prior to Admission medications   Medication Sig Start Date End Date Taking? Authorizing Provider  atorvastatin (LIPITOR) 20 MG tablet TAKE 1/2 TABLET BY MOUTH DAILY 05/16/21   [name], NP  calcium-vitamin D (OSCAL WITH D) 500-200 MG-UNIT per tablet Take 1 tablet by mouth daily.     [provider]  lisinopril-hydrochlorothiazide (ZESTORETIC) 20-25 MG tablet TAKE ONE TABLET BY MOUTH DAILY 05/16/21   [name], NP  tolterodine (DETROL LA) 4 MG 24 hr capsule TAKE ONE CAPSULE BY MOUTH DAILY 04/25/21   [name], NP    Physical Exam: Vitals:   06/27/21 1830 06/27/21 1854 06/27/21 1900 06/27/21 1915  BP: (!) 146/71 133/64 (!) 146/81 (!) 144/80  Pulse:  (!) 108 (!) 107 (!) 105  Resp:  20 18 17   Temp:  (!) 97 F (36.1 C)    TempSrc:  Oral    SpO2:  94% 93% 93%  Weight:       Constitutional: Elderly woman resting supine in bed, NAD, calm, comfortable Eyes: PERRL, lids and conjunctivae normal Head: Posterior scalp contusion without open wound ENMT: Dried blood on lips.  Mucous membranes are dry.  Neck: normal, supple, no masses. Respiratory: clear to auscultation bilaterally, no wheezing, no crackles. Normal respiratory effort. No accessory muscle use.  Cardiovascular: Tachycardic, no murmurs / rubs / gallops. No extremity edema. 2+ pedal pulses. Abdomen: no tenderness, no masses palpated. No hepatosplenomegaly. Bowel sounds positive.  Musculoskeletal: no clubbing / cyanosis. No joint deformity upper and lower extremities. Good ROM, no contractures. Normal  muscle tone.  Skin: no rashes, lesions, ulcers. No induration Neurologic: Expressive aphasia otherwise CN 2-12 grossly intact. Sensation intact. Strength 5/5 in all 4.  Psychiatric: Awake and alert.  EKG: Personally reviewed. Sinus tachycardia, rate 108, motion artifact.  Rate is faster when compared to prior.  Assessment/Plan Principal Problem:   Expressive aphasia Active Problems:   Subdural hematoma, post-traumatic   Closed skull fracture (HCC)   Hypercalcemia   Leukocytosis   Essential hypertension, benign   Hyperlipidemia   Malignant neoplasm of upper-outer quadrant of left breast in female, estrogen receptor positive ([place])   Terry Mcneil is a 81 y.o. female with medical history significant for left breast cancer (s/p lumpectomy, chemotherapy, radiation), HTN, HLD who is admitted for evaluation of expressive aphasia.  Assessment and Plan: * Expressive aphasia- (present on admission) CT head negative for acute infarct, mild subdural hematoma along the left tentorium and right occipital nondisplaced skull fracture noted. -Neurology following -MRI brain and MRA head pending -Echocardiogram -Carotid  Dopplers -Hold antiplatelets in setting of SDH -Monitor on telemetry, continue neurochecks -PT/OT/SLP eval -EEG per neurology  Subdural hematoma, post-traumatic- (present on admission) Mild subdural hematoma along the left tentorium and right occipital nondisplaced skull fracture noted on CT head.  EDP discussed with on-call neurosurgery who did not feel neurosurgical intervention needed. -Holding antiplatelets and blood thinners -Repeat CT head ordered by neurology  Hypercalcemia- (present on admission) Calcium 12.0 on admission, similar to labs from 1 month ago but previously normal. -Start IV fluid hydration with normal saline overnight -Hold HCTZ and calcium supplement  Leukocytosis- (present on admission) No clear infectious source.  Presumed reactive in setting of  acute head injury.  Essential hypertension, benign- (present on admission) Holding antihypertensives for now.  Hyperlipidemia- (present on admission) Continue atorvastatin.  Malignant neoplasm of upper-outer quadrant of left breast in female, estrogen receptor positive ([place]) Follows with [facility] oncology, Dr. Earline Mayotte.  S/p lumpectomy, chemotherapy, and radiation.  On active surveillance.  DVT prophylaxis: SCDs Code Status: Full code Family Communication: Discussed with patient's daughter-in-law at bedside Disposition Plan: From home, dispo pending clinical progress Consults called: Neurology Severity of Illness: The appropriate patient status for this patient is OBSERVATION. Observation status is judged to be reasonable and necessary in order to provide the required intensity of service to ensure the patient's safety. The patient's presenting symptoms, physical exam findings, and initial radiographic and laboratory data in the context of their medical condition is felt to place them at decreased risk for further clinical deterioration. Furthermore, it is anticipated that the patient will be medically stable for discharge from the hospital within 2 midnights of admission.   Zada Finders MD [facility]  If 7PM-7AM, please contact night-coverage [email or web address]  06/27/2021, 10:19 PM

## 2021-06-27 NOTE — Progress Notes (Signed)
Per nurse patient is to leave for MRI shortly .  Will try back for EEG. qm

## 2021-06-27 NOTE — ED Triage Notes (Signed)
Pt bib [facility] from home with husband where she normally uses a walker to get around and is able to speak normally. Husband found pt on floor. Pt arrives with aphasia but is able to follow all commands. CODE stroke called on pt.

## 2021-06-27 NOTE — Assessment & Plan Note (Signed)
Follows with [facility] oncology, Dr. Earline Mayotte.  S/p lumpectomy, chemotherapy, and radiation.  On active surveillance.

## 2021-06-27 NOTE — Assessment & Plan Note (Signed)
Continue atorvastatin

## 2021-06-27 NOTE — Hospital Course (Signed)
Terry Mcneil is a 81 y.o. female with medical history significant for left breast cancer (s/p lumpectomy, chemotherapy, radiation), HTN, HLD who is admitted for evaluation of expressive aphasia.

## 2021-06-27 NOTE — Progress Notes (Signed)
Called in regards to this patients head CT after sustaining a fall. Ct head shows a small SDH along the left tentorium along with a nondisplaced right occipital skull fracture. No need for neurosurgical intervention at this time. Could get a follow up head CT in am. Do not think her confusion or aphasia is likely from this small sdh

## 2021-06-27 NOTE — Progress Notes (Signed)
EEG complete - results pending 

## 2021-06-27 NOTE — Assessment & Plan Note (Signed)
Mild subdural hematoma along the left tentorium and right occipital nondisplaced skull fracture noted on CT head.  EDP discussed with on-call neurosurgery who did not feel neurosurgical intervention needed. -Holding antiplatelets and blood thinners -Repeat CT head ordered by neurology

## 2021-06-28 ENCOUNTER — Encounter (HOSPITAL_COMMUNITY): Payer: Self-pay | Admitting: Internal Medicine

## 2021-06-28 ENCOUNTER — Observation Stay (HOSPITAL_BASED_OUTPATIENT_CLINIC_OR_DEPARTMENT_OTHER): Payer: Medicare PPO

## 2021-06-28 ENCOUNTER — Observation Stay (HOSPITAL_COMMUNITY): Payer: Medicare PPO

## 2021-06-28 DIAGNOSIS — R4701 Aphasia: Secondary | ICD-10-CM | POA: Diagnosis not present

## 2021-06-28 DIAGNOSIS — I10 Essential (primary) hypertension: Secondary | ICD-10-CM

## 2021-06-28 DIAGNOSIS — S0003XA Contusion of scalp, initial encounter: Secondary | ICD-10-CM | POA: Diagnosis not present

## 2021-06-28 DIAGNOSIS — E785 Hyperlipidemia, unspecified: Secondary | ICD-10-CM | POA: Diagnosis not present

## 2021-06-28 DIAGNOSIS — D72829 Elevated white blood cell count, unspecified: Secondary | ICD-10-CM | POA: Diagnosis not present

## 2021-06-28 LAB — COMPREHENSIVE METABOLIC PANEL
ALT: 27 U/L (ref 0–44)
AST: 51 U/L — ABNORMAL HIGH (ref 15–41)
Albumin: 3.4 g/dL — ABNORMAL LOW (ref 3.5–5.0)
Alkaline Phosphatase: 58 U/L (ref 38–126)
Anion gap: 12 (ref 5–15)
BUN: 29 mg/dL — ABNORMAL HIGH (ref 8–23)
CO2: 23 mmol/L (ref 22–32)
Calcium: 10.9 mg/dL — ABNORMAL HIGH (ref 8.9–10.3)
Chloride: 101 mmol/L (ref 98–111)
Creatinine, Ser: 0.95 mg/dL (ref 0.44–1.00)
GFR, Estimated: >60 mL/min (ref >60)
Glucose, Bld: 131 mg/dL — ABNORMAL HIGH (ref 70–99)
Potassium: 3.8 mmol/L (ref 3.5–5.1)
Sodium: 136 mmol/L (ref 135–145)
Total Bilirubin: 0.7 mg/dL (ref 0.3–1.2)
Total Protein: 5.9 g/dL — ABNORMAL LOW (ref 6.5–8.1)

## 2021-06-28 LAB — URINALYSIS, ROUTINE W REFLEX MICROSCOPIC
Bilirubin Urine: NEGATIVE
Glucose, UA: NEGATIVE mg/dL
Ketones, ur: 20 mg/dL — AB
Leukocytes,Ua: NEGATIVE
Nitrite: POSITIVE — AB
Protein, ur: NEGATIVE mg/dL
Specific Gravity, Urine: 1.018 (ref 1.005–1.030)
pH: 5 (ref 5.0–8.0)

## 2021-06-28 LAB — CBC
HCT: 37.3 % (ref 36.0–46.0)
Hemoglobin: 12.5 g/dL (ref 12.0–15.0)
MCH: 32.6 pg (ref 26.0–34.0)
MCHC: 33.5 g/dL (ref 30.0–36.0)
MCV: 97.1 fL (ref 80.0–100.0)
Platelets: 212 10*3/uL (ref 150–400)
RBC: 3.84 MIL/uL — ABNORMAL LOW (ref 3.87–5.11)
RDW: 13.1 % (ref 11.5–15.5)
WBC: 12.7 10*3/uL — ABNORMAL HIGH (ref 4.0–10.5)
nRBC: 0 % (ref 0.0–0.2)

## 2021-06-28 LAB — RAPID URINE DRUG SCREEN, HOSP PERFORMED
Amphetamines: NOT DETECTED
Barbiturates: NOT DETECTED
Benzodiazepines: NOT DETECTED
Cocaine: NOT DETECTED
Opiates: NOT DETECTED
Tetrahydrocannabinol: NOT DETECTED

## 2021-06-28 LAB — ECHOCARDIOGRAM COMPLETE
AR max vel: 2.61 cm2
AV Peak grad: 6.5 mmHg
Ao pk vel: 1.28 m/s
Area-P 1/2: 2.1 cm2
S' Lateral: 2.1 cm
Weight: 2328.06 oz

## 2021-06-28 LAB — PHOSPHORUS: Phosphorus: 4.3 mg/dL (ref 2.5–4.6)

## 2021-06-28 LAB — LIPASE, BLOOD: Lipase: 35 U/L (ref 11–51)

## 2021-06-28 MED ORDER — OMEGA-3-ACID ETHYL ESTERS 1 G PO CAPS
1.0000 g | ORAL_CAPSULE | Freq: Two times a day (BID) | ORAL | Status: DC
  Administered 2021-06-28 – 2021-06-29 (×2): 1 g via ORAL
  Filled 2021-06-28 (×2): qty 1

## 2021-06-28 MED ORDER — ADULT MULTIVITAMIN W/MINERALS CH
1.0000 | ORAL_TABLET | Freq: Every day | ORAL | Status: DC
  Administered 2021-06-28 – 2021-06-29 (×2): 1 via ORAL
  Filled 2021-06-28 (×3): qty 1

## 2021-06-28 NOTE — Evaluation (Signed)
Physical Therapy Evaluation Patient Details Name: Terry Mcneil MRN: [id number] DOB: [birth year] Today's Date: 06/28/2021  History of Present Illness  Pt is an 81 y/o female found down outside of home. Imaging showed R occipital fx and small SDH. PMH includes HTN and L breast cancer.  Clinical Impression  Pt admitted secondary to problem above with deficits below. Pt requiring min to mod A to stand and transfer to chair. Pt anxious and fearful of falling. Slowed processing and noted expressive difficulty at times. Discussed SNF, however, pt and family wanting to return home at d/c. Recommending max HH services to ensure safety. Will continue to follow acutely.      Recommendations for follow up therapy are one component of a multi-disciplinary discharge planning process, led by the attending physician.  Recommendations may be updated based on patient status, additional functional criteria and insurance authorization.  Follow Up Recommendations Home health PT (max HH services as pt wanting to return home vs going to SNF)    Assistance Recommended at Discharge Frequent or constant Supervision/Assistance  Patient can return home with the following  A lot of help with walking and/or transfers;A lot of help with bathing/dressing/bathroom;Assistance with cooking/housework;Direct supervision/assist for financial management;Direct supervision/assist for medications management;Help with stairs or ramp for entrance;Assist for transportation    Equipment Recommendations Wheelchair cushion (measurements PT);Wheelchair (measurements PT)  Recommendations for Other Services       Functional Status Assessment Patient has had a recent decline in their functional status and demonstrates the ability to make significant improvements in function in a reasonable and predictable amount of time.     Precautions / Restrictions Precautions Precautions: Fall Restrictions Weight Bearing Restrictions: No       Mobility  Bed Mobility Overal bed mobility: Needs Assistance Bed Mobility: Supine to Sit     Supine to sit: Min assist     General bed mobility comments: Min A for trunk elevation.    Transfers Overall transfer level: Needs assistance Equipment used: 1 person hand held assist Transfers: Bed to chair/wheelchair/BSC, Sit to/from Stand Sit to Stand: Min assist Stand pivot transfers: Mod assist         General transfer comment: Min to mod A for lift assist and steadying. Pt anxious during transfer secondary to fear of falling. Shakiness noted as well.    Ambulation/Gait                  Stairs            Wheelchair Mobility    Modified Rankin (Stroke Patients Only)       Balance Overall balance assessment: Needs assistance Sitting-balance support: Feet supported, No upper extremity supported Sitting balance-Leahy Scale: Fair     Standing balance support: Bilateral upper extremity supported Standing balance-Leahy Scale: Poor Standing balance comment: REliant on BUE support                             Pertinent Vitals/Pain Pain Assessment Pain Assessment: No/denies pain    Home Living Family/patient expects to be discharged to:: Private residence Living Arrangements: Spouse/significant other Available Help at Discharge: Family;Available 24 hours/day Type of Home: House Home Access: Stairs to enter Entrance Stairs-Rails: Left Entrance Stairs-Number of Steps: 5   Home Layout: One level Home Equipment: Grab bars - tub/shower;Shower seat;Rollator (4 wheels) Additional Comments: 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    Prior Function Prior Level of Function : Independent/Modified Independent;Driving  Mobility Comments: Uses rollator for ambulation       Hand Dominance        Extremity/Trunk Assessment   Upper Extremity Assessment Upper Extremity Assessment: Defer to OT evaluation    Lower  Extremity Assessment Lower Extremity Assessment: Generalized weakness    Cervical / Trunk Assessment Cervical / Trunk Assessment: Kyphotic  Communication   Communication: Expressive difficulties  Cognition Arousal/Alertness: Awake/alert Behavior During Therapy: WFL for tasks assessed/performed Overall Cognitive Status: Impaired/Different from baseline Area of Impairment: Problem solving, Safety/judgement, Memory, Following commands                     Memory: Decreased short-term memory, Decreased recall of precautions Following Commands: Follows one step commands with increased time Safety/Judgement: Decreased awareness of deficits, Decreased awareness of safety   Problem Solving: Slow processing General Comments: Pt with expressive difficulty at times when attempting to explain why she is here.        General Comments General comments (skin integrity, edema, etc.): Pt's daughter in law present. Educated about recommendations for SNF, however, pt prefers to return home and family able to provide assist. Educated about using WC to increase safety.    Exercises     Assessment/Plan    PT Assessment Patient needs continued PT services  PT Problem List Decreased strength;Decreased balance;Decreased activity tolerance;Decreased mobility;Decreased knowledge of use of DME;Decreased cognition;Decreased knowledge of precautions;Decreased safety awareness       PT Treatment Interventions DME instruction;Gait training;Functional mobility training;Therapeutic activities;Therapeutic exercise;Balance training;Cognitive remediation;Patient/family education    PT Goals (Current goals can be found in the Care Plan section)  Acute Rehab PT Goals Patient Stated Goal: to go home PT Goal Formulation: With patient Time For Goal Achievement: 07/12/21 Potential to Achieve Goals: Good    Frequency Min 3X/week     Co-evaluation               AM-PAC PT "6 Clicks" Mobility   Outcome Measure Help needed turning from your back to your side while in a flat bed without using bedrails?: A Little Help needed moving from lying on your back to sitting on the side of a flat bed without using bedrails?: A Little Help needed moving to and from a bed to a chair (including a wheelchair)?: A Lot Help needed standing up from a chair using your arms (e.g., wheelchair or bedside chair)?: A Lot Help needed to walk in hospital room?: A Lot Help needed climbing 3-5 steps with a railing? : Total 6 Click Score: 13    End of Session Equipment Utilized During Treatment: Gait belt Activity Tolerance: Patient tolerated treatment well Patient left: in chair;with call bell/phone within reach;with family/visitor present (in chair in ED; RN aware) Nurse Communication: Mobility status PT Visit Diagnosis: Unsteadiness on feet (R26.81);Muscle weakness (generalized) (M62.81);Other symptoms and signs involving the nervous system (X51.700)    Time: 1749-4496 PT Time Calculation (min) (ACUTE ONLY): 20 min   Charges:   PT Evaluation $PT Eval Moderate Complexity: 1 Mod          Reuel Derby, PT, DPT  Acute Rehabilitation Services  Pager: [phone] Office: [phone]   Rudean Hitt 06/28/2021, 9:18 AM

## 2021-06-28 NOTE — Progress Notes (Signed)
Terry Mcneil  YNW:[id number] DOB: July 14, 1940 DOA: 06/27/2021 PCP: [name], NP    Brief Narrative:  81 year old with a history of breast cancer status post chemotherapy and radiation, HTN, and HLD who presented to the ED with expressive aphasia.  She was last seen normal 06/27/2021 at 1500.  At 1700 she was found laying on the floor of her garage unable to communicate.  In the ED CT head was negative for acute infarct but did note a right occipital nondisplaced skull fracture and a mild subdural hematoma along the left tentorium.  Consultants:  Neurology Neurosurgery  Code Status: FULL CODE  Antimicrobials:  None  DVT prophylaxis: SCDs  Interim Hx: Afebrile.  Vital signs stable.  Calcium has improved with simple hydration.  Alert and conversant though expressive aphasia still significant.  Patient states this has improved however since her presentation.  She denies chest pain shortness of breath or focal neurologic deficits otherwise.  She does have some low back pain.  Assessment & Plan:  Expressive aphasia - post-concussive syndrome  CT head negative for acute infarct -MRI/A noted no acute abnormality - f/u CT head noted no further evidence of bleeding - EEG w/o evidence of seizure activity - Neuro feels aphasia is pare of a post-concussive syndrome - fish oil BID added - f/u w/ Neuro as outpt   Posttraumatic subdural hematoma L tentorium - right occipital skull fracture Along the left tentorium associated with right occipital nondisplaced skull fracture - Neurosurgery reviewed images and did not feel intervention was indicated at present -avoiding antiplatelets/blood thinners - follow-up CT head noted no persisting evidence of bleed   Hypercalcemia Calcium 12 on admission -similar to 1 month prior -hydrate and follow  Essential HTN Permissive hypertension for now  HLD Continue atorvastatin  Breast cancer Followed by [facility] oncology -status postlumpectomy  chemotherapy and radiation -currently on active surveillance   Family Communication: Spoke with patient and son at bedside Disposition: from home - PT/OT evaluations -anticipate discharge home 2/16 pending clinical improvement and arrangement of home assistance  Objective: Blood pressure 119/82, pulse 93, temperature (!) 97 F (36.1 C), temperature source Oral, resp. rate 17, weight 66 kg, SpO2 93 %.  Intake/Output Summary (Last 24 hours) at 06/28/2021 0726 Last data filed at 06/28/2021 0217 Gross per 24 hour  Intake 199.95 ml  Output --  Net 199.95 ml   Filed Weights   06/27/21 1500  Weight: 66 kg    Examination: General: No acute respiratory distress Lungs: Clear to auscultation bilaterally without wheezes or crackles Cardiovascular: Regular rate and rhythm without murmur gallop or rub normal S1 and S2 Abdomen: Nontender, nondistended, soft, bowel sounds positive, no rebound, no ascites, no appreciable mass Extremities: No significant cyanosis, clubbing, or edema bilateral lower extremities  CBC: Recent Labs  Lab 06/27/21 1820 06/27/21 1851 06/28/21 0212  WBC  --  20.1* 12.7*  NEUTROABS  --  18.0*  --   HGB 13.6 13.2 12.5  HCT 40.0 39.0 37.3  MCV  --  95.1 97.1  PLT  --  252 657   Basic Metabolic Panel: Recent Labs  Lab 06/27/21 1820 06/27/21 1824 06/27/21 2345 06/28/21 0212  NA 136 137  --  136  K 3.3* 3.3*  --  3.8  CL 103 100  --  101  CO2  --  20*  --  23  GLUCOSE 144* 143*  --  131*  BUN 29* 28*  --  29*  CREATININE 1.10* 1.16*  --  0.95  CALCIUM  --  12.0*  --  10.9*  PHOS  --   --  4.3  --    GFR: CrCl cannot be calculated (Unknown ideal weight.).  Liver Function Tests: Recent Labs  Lab 06/27/21 1824 06/28/21 0212  AST 37 51*  ALT 22 27  ALKPHOS 69 58  BILITOT 0.8 0.7  PROT 6.5 5.9*  ALBUMIN 3.9 3.4*   Recent Labs  Lab 06/27/21 2345  LIPASE 35    Coagulation Profile: Recent Labs  Lab 06/27/21 1824  INR 1.0    HbA1C: Hgb  A1c MFr Bld  Date/Time Value Ref Range Status  06/27/2021 07:01 PM 5.2 4.8 - 5.6 % Final    Comment:    (NOTE) Pre diabetes:          5.7%-6.4%  Diabetes:              >6.4%  Glycemic control for   <7.0% adults with diabetes   05/19/2020 03:44 PM 5.5 <5.7 % of total Hgb Final    Comment:    For the purpose of screening for the presence of diabetes: . <5.7%       Consistent with the absence of diabetes 5.7-6.4%    Consistent with increased risk for diabetes             (prediabetes) > or =6.5%  Consistent with diabetes . This assay result is consistent with a decreased risk of diabetes. . Currently, no consensus exists regarding use of hemoglobin A1c for diagnosis of diabetes in children. . According to American Diabetes Association (ADA) guidelines, hemoglobin A1c <7.0% represents optimal control in non-pregnant diabetic patients. Different metrics may apply to specific patient populations.  Standards of Medical Care in Diabetes(ADA). .     CBG: Recent Labs  Lab 06/27/21 1813  GLUCAP 139*    Scheduled Meds:  atorvastatin  10 mg Oral Daily   Continuous Infusions:  sodium chloride 100 mL/hr at 06/27/21 2228     LOS: 0 days   Cherene Altes, MD [facility] Office  [phone] Pager - Text Page per Shea Evans  If 7PM-7AM, please contact night-coverage per Amion 06/28/2021, 7:26 AM

## 2021-06-28 NOTE — Progress Notes (Signed)
Patient admitted into room [address] 28 from ED

## 2021-06-28 NOTE — Evaluation (Signed)
Occupational Therapy Evaluation Patient Details Name: Terry Mcneil MRN: [id number] DOB: 11-Jun-1940 Today's Date: 06/28/2021   History of Present Illness Pt is an 81 y/o female found down outside of home. Imaging showed R occipital fx and small SDH. PMH includes HTN and L breast cancer.   Clinical Impression   PTA patient independent using rollator, driving and caring for her husband with dementia.  Admitted for above and presenting with problem list below, including generalized weakness, decreased R UE coordination, impaired balance, impaired communication and cognition.  Disoriented to time, follows simple commands with increased time with slow processing and decreased problem solving.  [name] highly fearful of falling during mobility.  Completes mobility with min-mod assist +2 for safety, min assist for UB Adls and up to mod assist for LB ADLs.  She will benefit from further OT services while admitted and after dc at [facility] level to optimize independence, safety and return to PLOF. Will follow acutely.      Recommendations for follow up therapy are one component of a multi-disciplinary discharge planning process, led by the attending physician.  Recommendations may be updated based on patient status, additional functional criteria and insurance authorization.   Follow Up Recommendations  Home health OT    Assistance Recommended at Discharge Frequent or constant Supervision/Assistance  Patient can return home with the following A lot of help with walking and/or transfers;A lot of help with bathing/dressing/bathroom;Assistance with cooking/housework;Direct supervision/assist for medications management;Direct supervision/assist for financial management;Assist for transportation;Help with stairs or ramp for entrance    Functional Status Assessment  Patient has had a recent decline in their functional status and demonstrates the ability to make significant improvements in function in a reasonable  and predictable amount of time.  Equipment Recommendations  BSC/3in1    Recommendations for Other Services       Precautions / Restrictions Precautions Precautions: Fall Restrictions Weight Bearing Restrictions: No      Mobility Bed Mobility Overal bed mobility: Needs Assistance Bed Mobility: Supine to Sit, Sit to Supine     Supine to sit: Min assist Sit to supine: Mod assist   General bed mobility comments: min for trunk elevation    Transfers                          Balance Overall balance assessment: Needs assistance Sitting-balance support: No upper extremity supported, Feet supported Sitting balance-Leahy Scale: Fair Sitting balance - Comments: posterior lean during ADLs   Standing balance support: Bilateral upper extremity supported, During functional activity Standing balance-Leahy Scale: Poor Standing balance comment: relies on BUE support                           ADL either performed or assessed with clinical judgement   ADL Overall ADL's : Needs assistance/impaired     Grooming: Minimal assistance;Sitting           Upper Body Dressing : Minimal assistance;Sitting   Lower Body Dressing: Sit to/from stand;Moderate assistance Lower Body Dressing Details (indicate cue type and reason): able to don socks, relies on BUE support in standing Toilet Transfer: +2 for safety/equipment;Moderate assistance Toilet Transfer Details (indicate cue type and reason): side stepping towards HOB         Functional mobility during ADLs: Minimal assistance;+2 for safety/equipment;Moderate assistance General ADL Comments: pt highly fearful of falling     Vision Baseline Vision/History: 1 Wears glasses (reading) Vision Assessment?:  No apparent visual deficits     Perception     Praxis      Pertinent Vitals/Pain Pain Assessment Pain Assessment: No/denies pain     Hand Dominance Right   Extremity/Trunk Assessment Upper Extremity  Assessment Upper Extremity Assessment: Generalized weakness;RUE deficits/detail RUE Deficits / Details: mild decreased coordination, reports "it feels different" RUE Sensation: decreased light touch RUE Coordination: decreased fine motor;decreased gross motor   Lower Extremity Assessment Lower Extremity Assessment: Defer to PT evaluation   Cervical / Trunk Assessment Cervical / Trunk Assessment: Kyphotic   Communication Communication Communication: Expressive difficulties   Cognition Arousal/Alertness: Awake/alert Behavior During Therapy: Anxious (with mobility) Overall Cognitive Status: Impaired/Different from baseline Area of Impairment: Problem solving, Safety/judgement, Memory, Following commands, Orientation                 Orientation Level: Disoriented to, Time   Memory: Decreased short-term memory, Decreased recall of precautions Following Commands: Follows one step commands with increased time Safety/Judgement: Decreased awareness of deficits, Decreased awareness of safety   Problem Solving: Slow processing, Difficulty sequencing, Requires verbal cues General Comments: expressive difficulty at times, able to problem sovle through month with min cueing but reports 2003 is the year and unable to correct with cueing.  Pt follow simple commands, decreased recall, problem solving noted throughout session. Question expressive difficulty vs cognition.     General Comments  pt daughter in law present, preference to dc home.  Educated on safety with recommendations and assist. 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      Prior Functioning/Environment Prior Level of Function : Independent/Modified Independent;Driving             Mobility Comments: Uses rollator for ambulation ADLs Comments: independent ADLs, IADLs and med mgmt        OT Problem List: Decreased strength;Decreased activity tolerance;Impaired balance (sitting and/or standing);Decreased cognition;Decreased coordination;Decreased safety awareness;Decreased knowledge of use of DME or AE;Decreased knowledge of precautions;Impaired UE functional use      OT Treatment/Interventions: Self-care/ADL training;Therapeutic exercise;DME and/or AE instruction;Therapeutic activities;Cognitive remediation/compensation;Patient/family education;Balance training    OT Goals(Current goals can be found in the care plan section) Acute Rehab OT Goals Patient Stated Goal: home OT Goal Formulation: With patient Time For Goal Achievement: 07/12/21 Potential to Achieve Goals: Good  OT Frequency: Min 2X/week    Co-evaluation              AM-PAC OT "6 Clicks" Daily Activity     Outcome Measure Help from another person eating meals?: A Little Help from another person taking care of personal grooming?: A Little Help from another person toileting, which includes using toliet, bedpan, or urinal?: Total Help from another person bathing (including washing, rinsing, drying)?: A Lot Help from another person to put on and taking off regular upper body clothing?: A Little Help from another person to put on and taking off regular lower body clothing?:  A Lot 6 Click Score: 14   End of Session Equipment Utilized During Treatment: Gait belt Nurse Communication: Mobility status  Activity Tolerance:  Patient tolerated treatment well Patient left: in bed;with call bell/phone within reach;with family/visitor present  OT Visit Diagnosis: Other abnormalities of gait and mobility (R26.89);Muscle weakness (generalized) (M62.81);Other symptoms and signs involving cognitive function;Other symptoms and signs involving the nervous system (R29.898)                Time: 6222-9798 OT Time Calculation (min): 20 min Charges:  OT General Charges $OT Visit: 1 Visit OT Evaluation $OT Eval Moderate Complexity: 1 Mod  Jolaine Artist, OT Acute Rehabilitation Services Pager [phone] Office [phone]   Delight Stare 06/28/2021, 10:06 AM

## 2021-06-28 NOTE — TOC Initial Note (Signed)
Transition of Care [facility] - Initial/Assessment Note    Patient Details  Name: Terry Mcneil MRN: [id number] Date of Birth: Nov 28, 1940  Transition of Care [facility]) CM/SW Contact:    Pollie Friar, RN Phone Number: 06/28/2021, 2:38 PM  Clinical Narrative:                 Patient is from home with her spouse. She has 24 hour supervision per her son.  Pt and son deny issues with home medications or transportation.  Recommendations for home health services at home. CM met with the patient and her son and they have no preference. CM has arranged home health with [facility]. Information on the AVS. Pt has recommended 3 in 1 at home but needs the wheelchair. Information sent to [facility] and they will deliver the [facility] to the room. TOC following.  Expected Discharge Plan: [place] Barriers to Discharge: Continued Medical Work up   Patient Goals and CMS Choice   CMS Medicare.gov Compare Post Acute Care list provided to:: Patient Choice offered to / list presented to : Patient, Adult Children  Expected Discharge Plan and Services Expected Discharge Plan: [place]   Discharge Planning Services: CM Consult Post Acute Care Choice: [place] arrangements for the past 2 months: Single Family Home                 DME Arranged: Youth worker wheelchair with seat cushion DME Agency: [employer] Date DME Agency Contacted: 06/28/21   Representative spoke with at DME Agency: Freda Munro HH Arranged: PT, OT [place] Agency: [facility] Date [place]: 06/28/21   Representative spoke with at [place]: Delsa Sale  Prior Living Arrangements/Services Living arrangements for the past 2 months: [place] Lives with:: Spouse Patient language and need for interpreter reviewed:: Yes (none) Do you feel safe going back to the place where you live?: Yes      Need for Family Participation in Patient Care: Yes (Comment) Care giver support system in  place?: Yes (comment) Current home services: DME (3 in 1) Criminal Activity/Legal Involvement Pertinent to Current Situation/Hospitalization: No - Comment as needed  Activities of Daily Living Home Assistive Devices/Equipment: Walker (specify type) ADL Screening (condition at time of admission) Patient's cognitive ability adequate to safely complete daily activities?: Yes Is the patient deaf or have difficulty hearing?: No Does the patient have difficulty seeing, even when wearing glasses/contacts?: No Does the patient have difficulty concentrating, remembering, or making decisions?: Yes Patient able to express need for assistance with ADLs?: Yes Does the patient have difficulty dressing or bathing?: No Independently performs ADLs?: No Communication: Independent Dressing (OT): Needs assistance Is this a change from baseline?: Change from baseline, expected to last >3 days Grooming: Needs assistance Is this a change from baseline?: Change from baseline, expected to last >3 days Feeding: Independent Bathing: Needs assistance Is this a change from baseline?: Change from baseline, expected to last >3 days Toileting: Needs assistance Is this a change from baseline?: Change from baseline, expected to last >3days In/Out Bed: Needs assistance Is this a change from baseline?: Change from baseline, expected to last >3 days Walks in Home: Needs assistance Is this a change from baseline?: Change from baseline, expected to last >3 days Does the patient have difficulty walking or climbing stairs?: Yes Weakness of Legs: Both Weakness of Arms/Hands: None  Permission Sought/Granted  Emotional Assessment Appearance:: Appears stated age Attitude/Demeanor/Rapport: Engaged Affect (typically observed): Accepting Orientation: : Oriented to Self, Oriented to Place   Psych Involvement: No (comment)  Admission diagnosis:  Aphasia [R47.01] Patient Active Problem List    Diagnosis Date Noted   Expressive aphasia 06/27/2021   Subdural hematoma, post-traumatic 06/27/2021   Closed skull fracture ([place]) 06/27/2021   Hypercalcemia 06/27/2021   Leukocytosis 06/27/2021   Closed dislocation of left hip (HCC)    Hip dislocation, left ([place]) 02/12/2019   Avascular necrosis of bone of hip, left ([place]) 01/13/2018   Status post total replacement of left hip 01/13/2018   Breast cancer ([place]) 03/18/2017   Malignant neoplasm of upper-outer quadrant of left breast in female, estrogen receptor positive ([place]) 11/05/2016   Insomnia 01/16/2016   Lower back pain 06/10/2014   Osteopenia 02/07/2014   Other malaise and fatigue 04/17/2013   Routine general medical examination at a health care facility 04/11/2013   Need for prophylactic vaccination and inoculation against influenza 04/11/2013   Essential hypertension, benign 10/15/2012   Hyperlipidemia 10/15/2012   Overactive bladder 10/15/2012   PCP:  [name], NP Pharmacy:   [id number] - [place], [address] [address] Phone: [phone] Fax: [id number] - [place], [place] - [address] [place] [address] Phone: [phone] Fax: [phone]     Social Determinants of Health (SDOH) Interventions    Readmission Risk Interventions No flowsheet data found.

## 2021-06-28 NOTE — Plan of Care (Addendum)
Neurology Brief Plan of Care  Patient with improvement in aphasia on brief evaluation this morning. [name] able to name 4/5 objects with some ongoing increased latency of responses.   MRI brain and MRA head wo contrast 2/15: 1. No acute intracranial abnormality. 2. Small amount of subdural blood over the posterior left hemisphere. 3. Chronic small vessel disease and volume loss. 4. Occipital fracture better characterized on CT. 5. Normal intracranial MRA.  Repeat CTH 2/16: 1. Nondisplaced right occipital fracture with posterior scalp hematoma. 2. The small left-sided subdural hematoma seen on the earlier MRI is not visible on this study.  Routine EEG 2/15: "This normal EEG is recorded in the waking and drowsy state. There was no seizure or seizure predisposition recorded on this study. Please note that lack of epileptiform activity on EEG does not preclude the possibility of epilepsy."  Assessment/Plan: 81 year old female who presented to the ED via EMS on 2/15 after sustaining an unwitnessed fall on concrete flooring for evaluation of aphasia.  Last seen normal by her husband at 15:00 on 2/15.  - Examination today reveals patient with improved expressive aphasia from yesterday.    - Imaging reveals a right occipital nondisplaced skull fracture with a mild subdural hematoma along the left tentorium. Repeat imaging is without re-demonstration of small left-sided subdural hematoma.  - Presentation initially concerning for an acute ischemic stroke though she is was a candidate for TNK due to recent unwitnessed head trauma. MRI brain obtained without further evidence of stroke. Patient's presentation is concerning for post-concussive syndrome with improvement in SDH but with right occipital fracture and some ongoing expressive aphasia (though improved from presentation).  - Will need outpatient neurology follow up for aphasia and post-concussive syndrome. Okay to initiate fish oil twice daily in  the setting of possible concussion.  No further inpatient neurology recommendations at this time. Plan discussed with attending neurologist, Dr. Reeves Forth, who is in agreement.   Anibal Henderson, AGAC-NP [facility] Pager: [phone]

## 2021-06-28 NOTE — Progress Notes (Signed)
Echocardiogram 2D Echocardiogram has been performed.  Jefferey Pica 06/28/2021, 9:34 AM

## 2021-06-28 NOTE — Care Management Obs Status (Signed)
[place] NOTIFICATION   Patient Details  Name: Terry Mcneil MRN: [id number] Date of Birth: 07-13-1940   Medicare Observation Status Notification Given:  Yes    Pollie Friar, RN 06/28/2021, 2:29 PM

## 2021-06-28 NOTE — Progress Notes (Signed)
VASCULAR LAB    Carotid duplex has been performed.  See CV proc for preliminary results.   Flynn Gwyn, RVT 06/28/2021, 4:01 PM

## 2021-06-28 NOTE — Procedures (Signed)
History: 81 yo F with aphasia  Sedation: None  Technique: This EEG was acquired with electrodes placed according to the International 10-20 electrode system (including Fp1, Fp2, F3, F4, C3, C4, P3, P4, O1, O2, T3, T4, T5, T6, A1, A2, Fz, Cz, Pz). The following electrodes were missing or displaced: none.   Background: The background consists of intermixed alpha and beta activities. There is a well defined posterior dominant rhythm of 9-10 Hz that attenuates with eye opening. There is increased delta associated with drowsiness. Sleep is not recorded.   Photic stimulation: Physiologic driving is not performed  EEG Abnormalities: None  Clinical Interpretation: This normal EEG is recorded in the waking and drowsy state. There was no seizure or seizure predisposition recorded on this study. Please note that lack of epileptiform activity on EEG does not preclude the possibility of epilepsy.   Roland Rack, MD [facility] [phone]  If 7pm- 7am, please page neurology on call as listed in [place].

## 2021-06-28 NOTE — Progress Notes (Signed)
Patient suffers from weakness which impairs their ability to perform daily activities like bathing, grooming, and toileting in the home.  A walker will not resolve  issue with performing activities of daily living. A wheelchair will allow patient to safely perform daily activities. Patient is not able to propel themselves in the home using a standard weight wheelchair due to general weakness. Patient can self propel in the lightweight wheelchair. Length of need 6 months .  Accessories: elevating leg rests (ELRs), wheel locks, extensions and anti-tippers.

## 2021-06-29 DIAGNOSIS — I10 Essential (primary) hypertension: Secondary | ICD-10-CM | POA: Diagnosis not present

## 2021-06-29 DIAGNOSIS — D72829 Elevated white blood cell count, unspecified: Secondary | ICD-10-CM | POA: Diagnosis not present

## 2021-06-29 DIAGNOSIS — E785 Hyperlipidemia, unspecified: Secondary | ICD-10-CM | POA: Diagnosis not present

## 2021-06-29 LAB — PARATHYROID HORMONE, INTACT (NO CA): PTH: 9 pg/mL — ABNORMAL LOW (ref 15–65)

## 2021-06-29 MED ORDER — TRAMADOL HCL 50 MG PO TABS: 50.0000 mg | ORAL_TABLET | Freq: Four times a day (QID) | ORAL | 0 refills | Status: DC | PRN

## 2021-06-29 MED ORDER — OMEGA-3-ACID ETHYL ESTERS 1 G PO CAPS
1.0000 g | ORAL_CAPSULE | Freq: Two times a day (BID) | ORAL | 0 refills | Status: DC
Start: 2021-06-29 — End: 2021-11-30

## 2021-06-29 MED ORDER — ACETAMINOPHEN 325 MG PO TABS: 650.0000 mg | ORAL_TABLET | ORAL | Status: DC | PRN

## 2021-06-29 NOTE — Progress Notes (Signed)
Occupational Therapy Treatment Patient Details Name: Terry Mcneil MRN: [id number] DOB: 03-20-1941 Today's Date: 06/29/2021   History of present illness Pt is an 81 y/o female found down outside of home. Imaging showed R occipital fx and small SDH. PMH includes HTN and L breast cancer.   OT comments  Pt making good progress with OT goals. This session, pt presenting with improved balance and activity tolerance. Requiring min guard to min A for transfers and functional mobility, as well as min-mod A for BADL's in standing. Pt maneuvering well with RW this session and reports this helps her feel safer with mobility. Provided family education on safe transfers/functional mobility and use of gait belt to assist pt. OT will continue to follow acutely.    Recommendations for follow up therapy are one component of a multi-disciplinary discharge planning process, led by the attending physician.  Recommendations may be updated based on patient status, additional functional criteria and insurance authorization.    Follow Up Recommendations  Home health OT    Assistance Recommended at Discharge Frequent or constant Supervision/Assistance  Patient can return home with the following  A lot of help with walking and/or transfers;A lot of help with bathing/dressing/bathroom;Assistance with cooking/housework;Direct supervision/assist for medications management;Direct supervision/assist for financial management;Assist for transportation;Help with stairs or ramp for entrance   Equipment Recommendations  BSC/3in1    Recommendations for Other Services      Precautions / Restrictions Precautions Precautions: Fall Restrictions Weight Bearing Restrictions: No       Mobility Bed Mobility Overal bed mobility: Needs Assistance Bed Mobility: Supine to Sit, Sit to Supine     Supine to sit: Min guard Sit to supine: Supervision   General bed mobility comments: No physical assist needed this session     Transfers Overall transfer level: Needs assistance Equipment used: Rolling walker (2 wheels) Transfers: Sit to/from Stand Sit to Stand: Min guard           General transfer comment: Hands on for safety, however no physical assist needed to come to stand statically     Balance Overall balance assessment: Needs assistance Sitting-balance support: No upper extremity supported, Feet supported Sitting balance-Leahy Scale: Good     Standing balance support: Bilateral upper extremity supported, During functional activity Standing balance-Leahy Scale: Poor Standing balance comment: relies on BUE support                           ADL either performed or assessed with clinical judgement   ADL Overall ADL's : Needs assistance/impaired     Grooming: Minimal assistance;Standing Grooming Details (indicate cue type and reason): Can stand at sink to complete, needs min A for balance while completing tasks, will require seated rest breaks                 Toilet Transfer: Moderate assistance;Ambulation Toilet Transfer Details (indicate cue type and reason): Needs short distance ambulation, will require rest breaks. Toileting- Clothing Manipulation and Hygiene: Moderate assistance;Sitting/lateral lean;Sit to/from stand Toileting - Clothing Manipulation Details (indicate cue type and reason): Need in creased assist for balance     Functional mobility during ADLs: Minimal assistance;Rolling walker (2 wheels) General ADL Comments: Pt moving better this session, provided education to family for safe transfers, use of gait belt, etc.    Extremity/Trunk Assessment              Vision       Perception  Praxis      Cognition Arousal/Alertness: Awake/alert Behavior During Therapy: WFL for tasks assessed/performed Overall Cognitive Status: Impaired/Different from baseline Area of Impairment: Safety/judgement, Problem solving                          Safety/Judgement: Decreased awareness of deficits, Decreased awareness of safety   Problem Solving: Slow processing, Difficulty sequencing, Requires verbal cues General Comments: Requiring min cuing throughout session        Exercises      Shoulder Instructions       General Comments Family present and agreeable to family education    Pertinent Vitals/ Pain       Pain Assessment Pain Assessment: No/denies pain  Home Living                                          Prior Functioning/Environment              Frequency  Min 2X/week        Progress Toward Goals  OT Goals(current goals can now be found in the care plan section)  Progress towards OT goals: Progressing toward goals  Acute Rehab OT Goals Patient Stated Goal: To go home OT Goal Formulation: With patient Time For Goal Achievement: 07/12/21 Potential to Achieve Goals: Good ADL Goals Pt Will Perform Grooming: with set-up;with supervision;sitting Pt Will Perform Upper Body Dressing: with set-up;sitting Pt Will Perform Lower Body Dressing: with min guard assist;sit to/from stand Pt Will Transfer to Toilet: with min guard assist;bedside commode;stand pivot transfer Pt Will Perform Toileting - Clothing Manipulation and hygiene: with min guard assist;sit to/from stand Pt/caregiver will Perform Home Exercise Program: Increased strength;Right Upper extremity Additional ADL Goal #1: Pt will complete 2 step ADL task with increased time and no more than minimal cueing.  Plan Discharge plan remains appropriate;Frequency remains appropriate    Co-evaluation                 AM-PAC OT "6 Clicks" Daily Activity     Outcome Measure   Help from another person eating meals?: A Little Help from another person taking care of personal grooming?: A Little Help from another person toileting, which includes using toliet, bedpan, or urinal?: A Lot Help from another person bathing (including  washing, rinsing, drying)?: A Lot Help from another person to put on and taking off regular upper body clothing?: A Little Help from another person to put on and taking off regular lower body clothing?: A Lot 6 Click Score: 15    End of Session Equipment Utilized During Treatment: Gait belt;Rolling walker (2 wheels)  OT Visit Diagnosis: Other abnormalities of gait and mobility (R26.89);Muscle weakness (generalized) (M62.81);Other symptoms and signs involving cognitive function;Other symptoms and signs involving the nervous system (R29.898)   Activity Tolerance Patient tolerated treatment well   Patient Left in bed;with call bell/phone within reach;with family/visitor present   Nurse Communication Mobility status        Time: 4132-4401 OT Time Calculation (min): 23 min  Charges: OT General Charges $OT Visit: 1 Visit OT Treatments $Self Care/Home Management : 23-37 mins  Reilyn Nelson H., OTR/L Acute Rehabilitation  Reagan Klemz Elane Hetty Linhart 06/29/2021, 2:21 PM

## 2021-06-29 NOTE — TOC Transition Note (Signed)
Transition of Care [facility]) - CM/SW Discharge Note   Patient Details  Name: Terry Mcneil MRN: [id number] Date of Birth: March 21, 1941  [facility] CM/SW Contact:  Pollie Friar, RN Phone Number: 06/29/2021, 10:13 AM   Clinical Narrative:    Patient is discharging home today with home health services through [place]. Jen with [facility] aware of discharge.  Wheelchair for home is at the bedside.  Pt has transportation home.    Final next level of care: Home w Home Health Services Barriers to Discharge: No Barriers Identified   Patient Goals and CMS Choice   CMS Medicare.gov Compare Post Acute Care list provided to:: Patient Choice offered to / list presented to : Patient, Adult Children  Discharge Placement                       Discharge Plan and Services   Discharge Planning Services: CM Consult Post Acute Care Choice: Home Health          DME Arranged: Lightweight manual wheelchair with seat cushion DME Agency: [employer] Date DME Agency Contacted: 06/28/21   Representative spoke with at DME Agency: Freda Munro HH Arranged: PT, OT, Speech Therapy [place] Agency: [facility] Date [place]: 06/28/21   Representative spoke with at [place]: [place] ([place]) Interventions     Readmission Risk Interventions No flowsheet data found.

## 2021-06-29 NOTE — Progress Notes (Signed)
Patient and family given discharge instructions and stated understanding.

## 2021-06-29 NOTE — Discharge Summary (Signed)
DISCHARGE SUMMARY  Terry Mcneil  MR#: [id number]  DOB:Jun 25, 1940  Date of Admission: 06/27/2021 Date of Discharge: 06/29/2021  Attending Physician:Justin Buechner Hennie Duos, MD  Patient's PCP:O'Sullivan, Lenna Sciara, NP  Consults: Neurology - Neurosurgery   Disposition: D/C home   Follow-up Appts:  Follow-up Information     [facility] Follow up.   Why: The home health agency will contact you for the first home visit. Contact information: [phone]                Discharge Diagnoses: Expressive aphasia Post-concussive syndrome  Posttraumatic subdural hematoma L tentorium Right occipital skull fracture Hypercalcemia Essential HTN HLD Breast cancer  Initial presentation: 81 year old with a history of breast cancer status post chemotherapy and radiation, HTN, and HLD who presented to the ED with expressive aphasia.  She was last seen normal 06/27/2021 at 1500.  At 1700 she was found laying on the floor of her garage unable to communicate.  In the ED CT head was negative for acute infarct but did note a right occipital nondisplaced skull fracture and a mild subdural hematoma along the left tentorium.  Hospital Course:  Expressive aphasia - post-concussive syndrome  CT head negative for acute infarct - MRI/A noted no acute abnormality - f/u CT head noted no further evidence of bleeding - EEG w/o evidence of seizure activity - Neuro feels aphasia is part of a post-concussive syndrome - fish oil BID added - f/u w/ Neuro as outpt    Posttraumatic subdural hematoma L tentorium - right occipital skull fracture Along the left tentorium associated with right occipital nondisplaced skull fracture - Neurosurgery reviewed images and did not feel intervention was indicated at present -avoiding antiplatelets/blood thinners (discussed w/ patient and family) - follow-up CT head noted no persisting evidence of bleed    Hypercalcemia Calcium 12 on admission - similar to 1 month  prior - improved w/ hydration    Essential HTN Ressume usual home BP meds at time of d/c    HLD Continue atorvastatin   Breast cancer Followed by [facility] -status pos tlumpectomy chemotherapy and radiation -currently on active surveillance    Allergies as of 06/29/2021       Reactions   Other Other (See Comments)   Sedation = BVM (??) per notes  "Pt with shallow respirations. Assisted by Rt with BVM"   Reclast [zoledronic Acid]    Developed bony growths in her mouth.         Medication List     TAKE these medications    acetaminophen 325 MG tablet Commonly known as: TYLENOL Take 2 tablets (650 mg total) by mouth every 4 (four) hours as needed for mild pain (or temp > 37.5 C (99.5 F)).   atorvastatin 20 MG tablet Commonly known as: LIPITOR TAKE 1/2 TABLET BY MOUTH DAILY   lisinopril-hydrochlorothiazide 20-25 MG tablet Commonly known as: ZESTORETIC TAKE ONE TABLET BY MOUTH DAILY   multivitamin tablet Take 1 tablet by mouth daily.   omega-3 acid ethyl esters 1 g capsule Commonly known as: LOVAZA Take 1 capsule (1 g total) by mouth 2 (two) times daily.   tolterodine 4 MG 24 hr capsule Commonly known as: DETROL LA TAKE ONE CAPSULE BY MOUTH DAILY   traMADol 50 MG tablet Commonly known as: Ultram Take 1 tablet (50 mg total) by mouth every 6 (six) hours as needed for severe pain.               Durable Medical Equipment  (  From admission, onward)           Start     Ordered   06/28/21 1431  For home use only DME lightweight manual wheelchair with seat cushion  Once       Comments: Patient suffers from weakness which impairs their ability to perform daily activities like bathing, grooming, and toileting in the home.  A walker will not resolve  issue with performing activities of daily living. A wheelchair will allow patient to safely perform daily activities. Patient is not able to propel themselves in the home using a standard weight  wheelchair due to general weakness. Patient can self propel in the lightweight wheelchair. Length of need 6 months . Accessories: elevating leg rests (ELRs), wheel locks, extensions and anti-tippers.   06/28/21 1430            Day of Discharge BP 140/82 (BP Location: Right Arm)    Pulse (!) 101    Temp 98.8 F (37.1 C) (Oral)    Resp 18    Ht 5\' 6"  (1.676 m)    Wt 67.8 kg    SpO2 96%    BMI 24.13 kg/m   Physical Exam: General: No acute respiratory distress Lungs: Clear to auscultation bilaterally without wheezes or crackles Cardiovascular: Regular rate and rhythm without murmur gallop or rub normal S1 and S2 Abdomen: Nontender, nondistended, soft, bowel sounds positive, no rebound, no ascites, no appreciable mass Extremities: No significant cyanosis, clubbing, or edema bilateral lower extremities  Basic Metabolic Panel: Recent Labs  Lab 06/27/21 1820 06/27/21 1824 06/27/21 2345 06/28/21 0212  NA 136 137  --  136  K 3.3* 3.3*  --  3.8  CL 103 100  --  101  CO2  --  20*  --  23  GLUCOSE 144* 143*  --  131*  BUN 29* 28*  --  29*  CREATININE 1.10* 1.16*  --  0.95  CALCIUM  --  12.0*  --  10.9*  PHOS  --   --  4.3  --     Liver Function Tests: Recent Labs  Lab 06/27/21 1824 06/28/21 0212  AST 37 51*  ALT 22 27  ALKPHOS 69 58  BILITOT 0.8 0.7  PROT 6.5 5.9*  ALBUMIN 3.9 3.4*    Coags: Recent Labs  Lab 06/27/21 1824  INR 1.0    CBC: Recent Labs  Lab 06/27/21 1820 06/27/21 1851 06/28/21 0212  WBC  --  20.1* 12.7*  NEUTROABS  --  18.0*  --   HGB 13.6 13.2 12.5  HCT 40.0 39.0 37.3  MCV  --  95.1 97.1  PLT  --  252 212    CBG: Recent Labs  Lab 06/27/21 1813  GLUCAP 139*    Time spent in discharge (includes decision making & examination of pt): 35 minutes  06/29/2021, 10:07 AM   Cherene Altes, MD [facility] Office  [phone]

## 2021-06-29 NOTE — Progress Notes (Signed)
While attempting IV, family member stated pt may be discharged tomorrow. Reveiwed chart and noted no current ordered IV medications. Spoke to RN regarding holding on IV placement due to the above. Agreed with this plan. Consult cleared.

## 2021-06-29 NOTE — Plan of Care (Signed)
°  Problem: Acute Rehab PT Goals(only PT should resolve) Goal: Pt Will Go Supine/Side To Sit Outcome: Adequate for Discharge Goal: Pt Will Go Sit To Supine/Side Outcome: Adequate for Discharge Goal: Patient Will Transfer Sit To/From Stand Outcome: Adequate for Discharge Goal: Pt Will Ambulate Outcome: Adequate for Discharge   Problem: Acute Rehab OT Goals (only OT should resolve) Goal: Pt. Will Perform Grooming Outcome: Adequate for Discharge Goal: Pt. Will Perform Upper Body Dressing Outcome: Adequate for Discharge Goal: Pt. Will Perform Lower Body Dressing Outcome: Adequate for Discharge Goal: Pt. Will Transfer To Toilet Outcome: Adequate for Discharge Goal: Pt. Will Perform Toileting-Clothing Manipulation Outcome: Adequate for Discharge Goal: Pt/Caregiver Will Perform Home Exercise Program Outcome: Adequate for Discharge   Problem: Acute Rehab OT Goals (only OT should resolve) Goal: OT Additional ADL Goal #1 Outcome: Adequate for Discharge   Problem: Education: Goal: Knowledge of General Education information will improve Description: Including pain rating scale, medication(s)/side effects and non-pharmacologic comfort measures Outcome: Adequate for Discharge   Problem: Health Behavior/Discharge Planning: Goal: Ability to manage health-related needs will improve Outcome: Adequate for Discharge   Problem: Clinical Measurements: Goal: Ability to maintain clinical measurements within normal limits will improve Outcome: Adequate for Discharge Goal: Will remain free from infection Outcome: Adequate for Discharge Goal: Diagnostic test results will improve Outcome: Adequate for Discharge Goal: Respiratory complications will improve Outcome: Adequate for Discharge Goal: Cardiovascular complication will be avoided Outcome: Adequate for Discharge   Problem: Activity: Goal: Risk for activity intolerance will decrease Outcome: Adequate for Discharge   Problem:  Nutrition: Goal: Adequate nutrition will be maintained Outcome: Adequate for Discharge   Problem: Coping: Goal: Level of anxiety will decrease Outcome: Adequate for Discharge   Problem: Elimination: Goal: Will not experience complications related to bowel motility Outcome: Adequate for Discharge Goal: Will not experience complications related to urinary retention Outcome: Adequate for Discharge   Problem: Pain Managment: Goal: General experience of comfort will improve Outcome: Adequate for Discharge   Problem: Safety: Goal: Ability to remain free from injury will improve Outcome: Adequate for Discharge   Problem: Skin Integrity: Goal: Risk for impaired skin integrity will decrease Outcome: Adequate for Discharge

## 2021-07-02 ENCOUNTER — Telehealth: Payer: Self-pay

## 2021-07-02 NOTE — Telephone Encounter (Signed)
Transition Care Management Follow-up Telephone Call Date of discharge and from where: 06/29/2021-[facility] How have you been since you were released from the hospital? Patient states-Doing as well as can be expected. Still a little weak. Any questions or concerns? No  Items Reviewed: Did the pt receive and understand the discharge instructions provided? Yes  Medications obtained and verified? Yes  Other? Yes  Any new allergies since your discharge? No  Dietary orders reviewed? Yes Do you have support at home? Yes   Home Care and Equipment/Supplies: Were home health services ordered? yes If so, what is the name of the agency? [facility]  Has the agency set up a time to come to the patient's home? yes Were any new equipment or medical supplies ordered?  No What is the name of the medical supply agency? N/a Were you able to get the supplies/equipment? not applicable Do you have any questions related to the use of the equipment or supplies? N/a  Functional Questionnaire: (I = Independent and D = Dependent) ADLs: I with assistance  Bathing/Dressing- I with assiatnce  Meal Prep- D  Eating- I  Maintaining continence- I with assistance  Transferring/Ambulation- Patient using wheelchair  Managing Meds- I  Follow up appointments reviewed:  PCP Hospital f/u appt confirmed? Yes  Scheduled to see [name] on 07/10/2021 @ 11:00. [facility] f/u appt confirmed? No  Neurology to schedule Are transportation arrangements needed? No  If their condition worsens, is the pt aware to call PCP or go to the Emergency Dept.? Yes Was the patient provided with contact information for the PCP's office or ED? Yes Was to pt encouraged to call back with questions or concerns? Yes

## 2021-07-03 ENCOUNTER — Telehealth: Payer: Self-pay | Admitting: Family

## 2021-07-03 NOTE — Telephone Encounter (Signed)
Rosann Auerbach is requesting vo for occupational therapy.   2x for 1 week 1x for 3 weeks 1x every other week for 3 weeks.   She can be reached at [phone]

## 2021-07-03 NOTE — Telephone Encounter (Signed)
Ok

## 2021-07-04 NOTE — Telephone Encounter (Signed)
Verbal orders given to [facility] for OT as indicated.

## 2021-07-10 ENCOUNTER — Telehealth: Payer: Medicare PPO | Admitting: Family

## 2021-07-18 ENCOUNTER — Encounter: Payer: Self-pay | Admitting: Neurology

## 2021-07-18 ENCOUNTER — Ambulatory Visit: Payer: Medicare PPO | Admitting: Neurology

## 2021-07-18 VITALS — BP 119/73 | HR 109

## 2021-07-18 DIAGNOSIS — S02119D Unspecified fracture of occiput, subsequent encounter for fracture with routine healing: Secondary | ICD-10-CM

## 2021-07-18 DIAGNOSIS — F0781 Postconcussional syndrome: Secondary | ICD-10-CM | POA: Diagnosis not present

## 2021-07-18 DIAGNOSIS — S065XAA Traumatic subdural hemorrhage with loss of consciousness status unknown, initial encounter: Secondary | ICD-10-CM

## 2021-07-18 DIAGNOSIS — G2581 Restless legs syndrome: Secondary | ICD-10-CM

## 2021-07-18 DIAGNOSIS — S069X9D Unspecified intracranial injury with loss of consciousness of unspecified duration, subsequent encounter: Secondary | ICD-10-CM | POA: Diagnosis not present

## 2021-07-18 MED ORDER — GABAPENTIN 100 MG PO CAPS: 100.0000 mg | ORAL_CAPSULE | Freq: Every day | ORAL | 2 refills | Status: DC

## 2021-07-18 NOTE — Patient Instructions (Addendum)
Continue your current medications  ?Continue with PT, OT and Speech therapy  ?Repeat head CT  ?Trial of Gabapentin 100 mg nightly  ?Follow up in 6 months  ?
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
## 2021-07-19 ENCOUNTER — Telehealth: Payer: Self-pay | Admitting: Family

## 2021-07-19 ENCOUNTER — Telehealth: Payer: Self-pay | Admitting: Neurology

## 2021-07-19 DIAGNOSIS — Z0289 Encounter for other administrative examinations: Secondary | ICD-10-CM

## 2021-07-19 NOTE — Telephone Encounter (Signed)
[employer] pending uploaded notes on the portal  

## 2021-07-19 NOTE — Telephone Encounter (Signed)
Terry Mcneil Terry Mcneil: [phone] (exp. 07/19/21 to 08/18/21) order sent to GI, they will reach out to the patient to schedule.  ?

## 2021-07-19 NOTE — Telephone Encounter (Signed)
[facility] called for update on [facility] forms  ? ?[facility] will refax documents over for provider signature  ? ?Please advise  ?[phone] ?

## 2021-07-20 NOTE — Telephone Encounter (Signed)
noted 

## 2021-07-25 ENCOUNTER — Telehealth: Payer: Self-pay | Admitting: *Deleted

## 2021-07-25 NOTE — Telephone Encounter (Signed)
Pt [employer] form e-mail on 07/25/21 to D-in law  ?

## 2021-08-01 ENCOUNTER — Other Ambulatory Visit: Payer: Medicare PPO

## 2021-08-09 ENCOUNTER — Encounter: Payer: Self-pay | Admitting: Neurology

## 2021-08-16 ENCOUNTER — Other Ambulatory Visit: Payer: Self-pay | Admitting: Family

## 2021-08-17 ENCOUNTER — Ambulatory Visit
Admission: RE | Admit: 2021-08-17 | Discharge: 2021-08-17 | Disposition: A | Discharge status: Home / Self Care | Payer: Medicare PPO | Source: Ambulatory Visit | Attending: Neurology | Admitting: Neurology

## 2021-08-17 DIAGNOSIS — S065XAA Traumatic subdural hemorrhage with loss of consciousness status unknown, initial encounter: Secondary | ICD-10-CM | POA: Diagnosis not present

## 2021-08-27 ENCOUNTER — Encounter: Payer: Self-pay | Admitting: Neurology

## 2021-09-04 ENCOUNTER — Telehealth: Payer: Self-pay | Admitting: Family

## 2021-09-04 DIAGNOSIS — Z87891 Personal history of nicotine dependence: Secondary | ICD-10-CM

## 2021-09-04 DIAGNOSIS — H269 Unspecified cataract: Secondary | ICD-10-CM | POA: Diagnosis not present

## 2021-09-04 DIAGNOSIS — Z9181 History of falling: Secondary | ICD-10-CM

## 2021-09-04 DIAGNOSIS — N3941 Urge incontinence: Secondary | ICD-10-CM | POA: Diagnosis not present

## 2021-09-04 DIAGNOSIS — S065X0D Traumatic subdural hemorrhage without loss of consciousness, subsequent encounter: Secondary | ICD-10-CM | POA: Diagnosis not present

## 2021-09-04 DIAGNOSIS — I10 Essential (primary) hypertension: Secondary | ICD-10-CM | POA: Diagnosis not present

## 2021-09-04 DIAGNOSIS — M1990 Unspecified osteoarthritis, unspecified site: Secondary | ICD-10-CM | POA: Diagnosis not present

## 2021-09-04 DIAGNOSIS — M8580 Other specified disorders of bone density and structure, unspecified site: Secondary | ICD-10-CM | POA: Diagnosis not present

## 2021-09-04 DIAGNOSIS — S82001D Unspecified fracture of right patella, subsequent encounter for closed fracture with routine healing: Secondary | ICD-10-CM | POA: Diagnosis not present

## 2021-09-04 DIAGNOSIS — E785 Hyperlipidemia, unspecified: Secondary | ICD-10-CM

## 2021-09-04 DIAGNOSIS — S02119D Unspecified fracture of occiput, subsequent encounter for fracture with routine healing: Secondary | ICD-10-CM | POA: Diagnosis not present

## 2021-09-04 DIAGNOSIS — C50412 Malignant neoplasm of upper-outer quadrant of left female breast: Secondary | ICD-10-CM | POA: Diagnosis not present

## 2021-09-04 DIAGNOSIS — Z96642 Presence of left artificial hip joint: Secondary | ICD-10-CM

## 2021-09-04 NOTE — Telephone Encounter (Signed)
Caller/Agency: [facility]  ?Callback Number: [phone] ?Requesting OT/PT/Skilled Nursing/Social Work/Speech Therapy: PT ?Frequency: 1x for 8 weeks  ?

## 2021-09-05 ENCOUNTER — Other Ambulatory Visit: Payer: Self-pay | Admitting: *Deleted

## 2021-09-05 MED ORDER — GABAPENTIN 100 MG PO CAPS: 300.0000 mg | ORAL_CAPSULE | Freq: Every day | ORAL | 0 refills | Status: DC

## 2021-09-07 NOTE — Telephone Encounter (Signed)
Called a few times but no answer, lvm for HH rep to call back ?

## 2021-09-10 NOTE — Telephone Encounter (Signed)
Called but no answer. Lvm with verbal orders to initiate OT, PT, speech therapy, social work and speech therapy for patient.  ?

## 2021-09-10 NOTE — Telephone Encounter (Signed)
HH called back, okay ldvm on number provided.  ?

## 2021-09-17 DIAGNOSIS — M87180 Osteonecrosis due to drugs, jaw: Secondary | ICD-10-CM | POA: Diagnosis not present

## 2021-09-17 DIAGNOSIS — T458X5A Adverse effect of other primarily systemic and hematological agents, initial encounter: Secondary | ICD-10-CM | POA: Diagnosis not present

## 2021-09-17 DIAGNOSIS — Z7983 Long term (current) use of bisphosphonates: Secondary | ICD-10-CM | POA: Diagnosis not present

## 2021-09-19 ENCOUNTER — Telehealth: Payer: Self-pay | Admitting: Family

## 2021-09-19 NOTE — Telephone Encounter (Signed)
No call back name or number documented  for [place] to call back with orders. Called Caller/Agency: [facility] Number: [phone], information from a previous message.  ?No answer, left verbal orders on message per provider ok for them to go back Friday to see the patient.  ?

## 2021-09-19 NOTE — Telephone Encounter (Signed)
Nurse Assessment ?Nurse: Sheppard Plumber, RN, Estill Bamberg Date/Time (Eastern Time): 09/19/2021 10:52:54 AM ?Confirm and document reason for call. If ?symptomatic, describe symptoms. ?---caller states she is the PT and the patient's bp 65/38 ?oxygen 93% was seen at dentist monday for tooth ?infection. on antibiotics. ?Does the patient have any new or worsening ?symptoms? ---Yes ?Will a triage be completed? ---Yes ?Related visit to physician within the last 2 weeks? ---Yes ?Does the PT have any chronic conditions? (i.e. ?diabetes, asthma, this includes High risk factors for ?pregnancy, etc.) ?---Yes ?Is this a behavioral health or substance abuse call? ---No ?Guidelines ?Guideline Title Affirmed Question Affirmed Notes Nurse Date/Time (Eastern ?Time) ?Blood Pressure - Low Shock suspected ?(e.g., cold/pale/ ?clammy skin, too ?weak to stand, low ?BP, rapid pulse) ?[place], RN, ?Estill Bamberg ?09/19/2021 10:54:13 ?AM ?Disp. Time (Eastern ?Time) Disposition Final User ?09/19/2021 10:51:22 AM Send to Urgent Annia Friendly ?PLEASE NOTE: All timestamps contained within this report are represented as [place] Standard Time. ?CONFIDENTIALTY NOTICE: This fax transmission is intended only for the addressee. It contains information that is legally privileged, confidential or ?otherwise protected from use or disclosure. If you are not the intended recipient, you are strictly prohibited from reviewing, disclosing, copying using ?or disseminating any of this information or taking any action in reliance on or regarding this information. If you have received this fax in error, please ?notify us immediately by telephone so that we can arrange for its return to [place]. Phone: [phone], Toll-Free: [phone], Fax: [phone] ?Page: 2 of 2 ?Call Id: [id number] ?Disp. Time (Eastern ?Time) Disposition Final User ?09/19/2021 11:01:25 AM 911 Outcome Documentation [place], RN, Estill Bamberg ?Reason: EMS on the way ?09/19/2021 10:55:19 AM Call EMS 911 Now Yes  [place], RN, Estill Bamberg ?Caller Disagree/Comply Comply ?Caller Understands Yes ?PreDisposition InappropriateToAsk ?Care Advice Given Per Guideline ?CALL EMS 911 NOW: * Immediate medical attention is needed. You need to hang up and call 911 (or an ambulance). CARE ?ADVICE given per Low Blood Pressure (Adult) guideline. ?Comments ?User: Rozelle Logan, RN Date/Time Eilene Ghazi Time): 09/19/2021 10:56:09 AM ?bp taken by PT manually. patient has said she is woozy and she is weak ?

## 2021-09-19 NOTE — Telephone Encounter (Signed)
[facility] nurse called back and stated EMS is there and giving iv fluids to pt, in the hopes she won't have to go to to the hospital. [facility] is requesting orders for a nurse to stop by on Friday.  ?

## 2021-09-19 NOTE — Telephone Encounter (Signed)
HH called and stated pt has a mouth infection and was seen by her dentist yesterday and given antibiotics. Since this infection, pt has no appetite and feeling dizzy. Her bp is currently 65/36 and oxygen level is 93%. Pt triaged.  ?

## 2021-09-19 NOTE — Telephone Encounter (Signed)
OK to give return home health orders.  ?

## 2021-09-20 ENCOUNTER — Encounter: Payer: Self-pay | Admitting: Neurology

## 2021-09-20 ENCOUNTER — Ambulatory Visit: Payer: Medicare PPO | Admitting: Neurology

## 2021-09-20 VITALS — BP 135/77 | HR 88 | Ht 66.0 in | Wt 138.5 lb

## 2021-09-20 DIAGNOSIS — S069X9D Unspecified intracranial injury with loss of consciousness of unspecified duration, subsequent encounter: Secondary | ICD-10-CM

## 2021-09-20 DIAGNOSIS — S065XAA Traumatic subdural hemorrhage with loss of consciousness status unknown, initial encounter: Secondary | ICD-10-CM | POA: Diagnosis not present

## 2021-09-20 DIAGNOSIS — S02119D Unspecified fracture of occiput, subsequent encounter for fracture with routine healing: Secondary | ICD-10-CM | POA: Diagnosis not present

## 2021-09-20 DIAGNOSIS — F0781 Postconcussional syndrome: Secondary | ICD-10-CM | POA: Diagnosis not present

## 2021-09-20 MED ORDER — ZOLPIDEM TARTRATE ER 6.25 MG PO TBCR: 6.2500 mg | EXTENDED_RELEASE_TABLET | Freq: Every evening | ORAL | 0 refills | Status: DC | PRN
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
## 2021-09-20 NOTE — Patient Instructions (Signed)
Trial of zolpidem for insomnia ?Discontinue gabapentin ?Continue other medication ?Continue physical therapy ?Follow-up in 6 months or sooner if worse. ?

## 2021-09-27 NOTE — Telephone Encounter (Signed)
[facility] is calling about orders faxed to [place] on 05/10, advised them to fax orders to [place] again and verified our fax number. Please advise.

## 2021-09-28 NOTE — Telephone Encounter (Signed)
Orders have been received and given to provider for signature.

## 2021-10-04 DIAGNOSIS — Z1231 Encounter for screening mammogram for malignant neoplasm of breast: Secondary | ICD-10-CM | POA: Diagnosis not present

## 2021-10-04 LAB — HM MAMMOGRAPHY

## 2021-10-22 ENCOUNTER — Other Ambulatory Visit: Payer: Self-pay | Admitting: Family

## 2021-10-27 ENCOUNTER — Other Ambulatory Visit: Payer: Self-pay | Admitting: Neurology

## 2021-11-05 ENCOUNTER — Other Ambulatory Visit: Payer: Self-pay | Admitting: Neurology

## 2021-11-06 ENCOUNTER — Telehealth: Payer: Self-pay | Admitting: Neurology

## 2021-11-06 NOTE — Telephone Encounter (Signed)
I spoke with the patient's daughter-in-law, Zylie Erazo (as per [facility]). During the patient's last office visit Dr. Teresa Coombs discontinued gabapentin due to lack of benefit. Misty Stanley stated that the patient did not remember it had been discontinued. She will inform her of the change. She expressed understanding and appreciation of the call.

## 2021-11-26 ENCOUNTER — Other Ambulatory Visit: Payer: Self-pay | Admitting: Neurology

## 2021-11-30 ENCOUNTER — Telehealth: Payer: Self-pay | Admitting: Family

## 2021-11-30 ENCOUNTER — Ambulatory Visit: Payer: Medicare PPO | Admitting: Family

## 2021-11-30 VITALS — BP 115/55 | HR 100 | Temp 97.6°F | Resp 16 | Wt 137.0 lb

## 2021-11-30 DIAGNOSIS — R059 Cough, unspecified: Secondary | ICD-10-CM | POA: Diagnosis not present

## 2021-11-30 DIAGNOSIS — N3281 Overactive bladder: Secondary | ICD-10-CM | POA: Diagnosis not present

## 2021-11-30 DIAGNOSIS — I10 Essential (primary) hypertension: Secondary | ICD-10-CM

## 2021-11-30 DIAGNOSIS — E785 Hyperlipidemia, unspecified: Secondary | ICD-10-CM

## 2021-11-30 DIAGNOSIS — R739 Hyperglycemia, unspecified: Secondary | ICD-10-CM | POA: Diagnosis not present

## 2021-11-30 MED ORDER — OMEPRAZOLE 40 MG PO CPDR: 40.0000 mg | DELAYED_RELEASE_CAPSULE | Freq: Every day | ORAL | 3 refills | Status: DC

## 2021-11-30 MED ORDER — LISINOPRIL-HYDROCHLOROTHIAZIDE 10-12.5 MG PO TABS: 1.0000 | ORAL_TABLET | Freq: Every day | ORAL | 0 refills | Status: DC

## 2021-11-30 MED ORDER — OMEGA-3-ACID ETHYL ESTERS 1 G PO CAPS: 1.0000 g | ORAL_CAPSULE | Freq: Two times a day (BID) | ORAL | 0 refills | Status: DC

## 2021-11-30 NOTE — Patient Instructions (Signed)
Please begin omeprazole '40mg'$  (antacid medicine) to see if this helps with cough. Decrease lisinopril-hctz from 20-25 to 10-12.'5mg'$  once daily.

## 2021-11-30 NOTE — Progress Notes (Signed)
Subjective:   By signing my name below, I, Terry Mcneil, attest that this documentation has been prepared under the direction and in the presence of Terry Chimera, NP 11/30/2021   Patient ID: Terry Mcneil, female    DOB: 10-19-1940, 81 y.o.   MRN: [id number]  No chief complaint on file.   HPI Patient is in today for an office visit. [name] accompanied by her caregiver.   Fall - She reports that she recently fell and hit her head. She states that she was in her car, shut the door but does not remember after that. [name] not sure if she was dizzy prior to the fall. She was admitted to the ED but did not get admitted to a facility. She reports no residual issues from the fall.  Cough - Her caregiver is concerned about the patient coughing throughout the night. The patient denies of any drainage or sputum production.  Blood Pressure - As of today's visit, her blood pressure is normal. [name] currently taking 20-25 Mg of Lisinopril-Hydrochlorothiazide.  BP Readings from Last 3 Encounters:  11/30/21 (!) 115/55  09/20/21 135/77  07/18/21 119/73   Pulse Readings from Last 3 Encounters:  11/30/21 100  09/20/21 88  07/18/21 (!) 109   Insomnia - She reports that she was previously having troubles sleeping but states that symptoms are improving. [name] not using 6.25 Mg of Zolpidem or the three tablets of 100 Mg of Gabapentin.  Pain Medication - [name] not taking 50 Mg of Tramadol. Overactive bladder - [name] currently taking 4 Mg of Detrol LA and reports that her overactive bladder is controlled.  Additional Medications - [name] currently taking 1 Mg of Folic acid, Multivitamins, Biotin and Fish Oil. [name] also taking Tylenol PRN.  Cholesterol - [name] currently taking 20 Mg of Atorvastatin.  Lab Results  Component Value Date   CHOL 149 06/27/2021   HDL 77 06/27/2021   LDLCALC 59 06/27/2021   LDLDIRECT 78.0 08/24/2019   TRIG 63 06/27/2021   CHOLHDL 1.9 06/27/2021    Immunizations - She reports that she has received her second dose of the Shingles vaccine.    Health Maintenance Due  Topic Date Due   Zoster Vaccines- Shingrix (1 of 2) Never done   COVID-19 Vaccine (3 - Pfizer risk series) 07/20/2019   TETANUS/TDAP  09/09/2020    Past Medical History:  Diagnosis Date   Arthritis    back   Cancer ([facility]) 09/2016   left breast cancer   Cataract    Colon polyps    Hypertension    Osteopenia 10/11/2009   Patellar fracture    Right Knee    Urge incontinence     Past Surgical History:  Procedure Laterality Date   BREAST LUMPECTOMY Left 09/2016   COLONOSCOPY     EYE SURGERY Bilateral    Cataracts    HIP CLOSED REDUCTION Left 02/13/2019   Procedure: CLOSED MANIPULATION OF DISLOCATED HIP;  Surgeon: Newt Minion, MD;  Location: [place];  Service: Orthopedics;  Laterality: Left;   ORIF PATELLA Right 08/28/2012   Procedure: OPEN REDUCTION INTERNAL (ORIF) FIXATION PATELLA- RIGHT;  Surgeon: Newt Minion, MD;  Location: [place];  Service: Orthopedics;  Laterality: Right;  Open Reduction Internal Fixation Right Patella   RADIOACTIVE SEED GUIDED PARTIAL MASTECTOMY WITH AXILLARY SENTINEL LYMPH NODE BIOPSY Left 10/03/2016   Procedure: LEFT BREAST RADIOACTIVE SEED GUIDED PARTIAL MASTECTOMY WITH LEFT SENTINEL LYMPH NODE  MAPPING;  Surgeon: Erroll Luna, MD;  Location: [place];  Service: General;  Laterality: Left;   TONSILLECTOMY     TOTAL HIP ARTHROPLASTY Left 01/13/2018   Procedure: LEFT TOTAL HIP ARTHROPLASTY ANTERIOR APPROACH;  Surgeon: Mcarthur Rossetti, MD;  Location: [place];  Service: Orthopedics;  Laterality: Left;   TUBAL LIGATION     VAGINAL DELIVERY     x2 -     Family History  Problem Relation Age of Onset   Heart disease Father 37       valve replacement    Social History   Socioeconomic History   Marital status: Married    Spouse name: Terry Mcneil   Number of children: 2   Years of education: Not on file    Highest education level: Not on file  Occupational History   Occupation: Retired  Tobacco Use   Smoking status: Former    Types: Cigarettes    Quit date: 08/27/1978    Years since quitting: 43.2   Smokeless tobacco: Never  Vaping Use   Vaping Use: Never used  Substance and Sexual Activity   Alcohol use: Yes    Alcohol/week: 7.0 standard drinks of alcohol    Types: 7 Glasses of wine per week   Drug use: No   Sexual activity: Yes  Other Topics Concern   Not on file  Social History Narrative   Marital Status:  Married ([name]) Husband has been diagnosed with Alzheimers.  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     Alcohol Use:  Moderate (Wine), 2 glasses 3 times a week   Drug Use:  None   Diet:  Weight Watchers    Exercise:  Not regular   Hobbies: Reading , Gardening , Grandchildren            Social Determinants of Health   Financial Resource Strain: Low Risk  (12/06/2019)   Overall Financial Resource Strain (CARDIA)    Difficulty of Paying Living Expenses: Not hard at all  Food Insecurity: No Food Insecurity (12/06/2019)   Hunger Vital Sign    Worried About Running Out of Food in the Last Year: Never true    [place] in the Last Year: Never true  Transportation Needs: No Transportation Needs (12/06/2019)   PRAPARE - Hydrologist (Medical): No    Lack of Transportation (Non-Medical): No  Physical Activity: Not on file  Stress: Not on file  Social Connections: Not on file  Intimate Partner Violence: Not on file    Outpatient Medications Prior to Visit  Medication Sig Dispense Refill   acetaminophen (TYLENOL) 325 MG tablet Take 2 tablets (650 mg total) by mouth every 4 (four)  hours as needed for mild pain (or temp > 37.5 C (99.5 F)).     amoxicillin (AMOXIL) 875 MG tablet Take 875 mg by mouth daily.     atorvastatin (LIPITOR) 20 MG tablet Take 0.5 tablets (10 mg total) by mouth daily. 90 tablet 1   BIOTIN PO Take by mouth.     calcium-vitamin D (OSCAL WITH D) 500-5 MG-MCG tablet Take 1 tablet by mouth.     chlorhexidine (PERIDEX) 0.12 % solution  SMARTSIG:15-20 Milliliter(s) By Mouth Morning-Evening     folic acid (FOLVITE) 1 MG tablet Take 1 mg by mouth daily.     gabapentin (NEURONTIN) 100 MG capsule Take 3 capsules (300 mg total) by mouth at bedtime. 90 capsule 0   lisinopril-hydrochlorothiazide (ZESTORETIC) 20-25 MG tablet TAKE ONE TABLET BY MOUTH DAILY 90 tablet 1   Multiple Vitamin (MULTIVITAMIN) tablet Take 1 tablet by mouth daily.     omega-3 acid ethyl esters (LOVAZA) 1 g capsule Take 1 capsule (1 g total) by mouth 2 (two) times daily. 84 capsule 0   tolterodine (DETROL LA) 4 MG 24 hr capsule TAKE ONE CAPSULE BY MOUTH DAILY 90 capsule 1   traMADol (ULTRAM) 50 MG tablet Take by mouth every 6 (six) hours as needed.     UNABLE TO FIND Med Name: tollerine 4 mg     zolpidem (AMBIEN CR) 6.25 MG CR tablet TAKE ONE (1) TABLET AT BEDTIME 30 tablet 0   No facility-administered medications prior to visit.    Allergies  Allergen Reactions   Other Other (See Comments)    Sedation = BVM (??) per notes  "Pt with shallow respirations. Assisted by Rt with BVM"   Reclast [Zoledronic Acid]     Developed bony growths in her mouth.     Review of Systems  Respiratory:  Positive for cough. Negative for sputum production.        (-) Drainage       Objective:    Physical Exam Constitutional:      General: [name] not in acute distress.    Appearance: Normal appearance. [name] not ill-appearing.  HENT:     Head: Normocephalic and atraumatic.     Right Ear: External ear normal.     Left Ear: External ear normal.  Eyes:     Extraocular Movements: Extraocular  movements intact.     Pupils: Pupils are equal, round, and reactive to light.  Cardiovascular:     Rate and Rhythm: Normal rate and regular rhythm.     Heart sounds: Normal heart sounds. No murmur heard.    No gallop.  Pulmonary:     Effort: Pulmonary effort is normal. No respiratory distress.     Breath sounds: Normal breath sounds. No wheezing or rales.  Skin:    General: Skin is warm and dry.  Neurological:     Mental Status: [name] alert and oriented to person, place, and time.  Psychiatric:        Mood and Affect: Mood normal.        Behavior: Behavior normal.        Judgment: Judgment normal.     There were no vitals taken for this visit. Wt Readings from Last 3 Encounters:  09/20/21 138 lb 8 oz (62.8 kg)  06/28/21 149 lb 8 oz (67.8 kg)  05/25/21 147 lb (66.7 kg)       Assessment & Plan:   Problem List Items Addressed This Visit   None    No orders of the defined types were placed in this encounter.   I, Terry Mcneil, personally preformed the services described in this documentation.  All medical record entries made by the scribe were at my direction and in my presence.  I have reviewed the chart and discharge instructions (if applicable) and agree that the record reflects my personal performance and is accurate and complete. 11/30/2021  I,Amber Collins,acting as a scribe for Nance Pear, NP.,have documented all relevant documentation  on the behalf of Nance Pear, NP,as directed by  Nance Pear, NP while in the presence of Nance Pear, NP.  [employer]

## 2021-12-01 ENCOUNTER — Telehealth: Payer: Self-pay | Admitting: Family

## 2021-12-01 DIAGNOSIS — R059 Cough, unspecified: Secondary | ICD-10-CM | POA: Insufficient documentation

## 2021-12-01 DIAGNOSIS — R739 Hyperglycemia, unspecified: Secondary | ICD-10-CM | POA: Insufficient documentation

## 2021-12-01 LAB — HEMOGLOBIN A1C
Hgb A1c MFr Bld: 5.5 % of total Hgb (ref <5.7)
Mean Plasma Glucose: 111 mg/dL
eAG (mmol/L): 6.2 mmol/L

## 2021-12-01 LAB — BASIC METABOLIC PANEL
BUN/Creatinine Ratio: 23 (calc) — ABNORMAL HIGH (ref 6–22)
BUN: 25 mg/dL (ref 7–25)
CO2: 26 mmol/L (ref 20–32)
Calcium: 10.3 mg/dL (ref 8.6–10.4)
Chloride: 94 mmol/L — ABNORMAL LOW (ref 98–110)
Creat: 1.1 mg/dL — ABNORMAL HIGH (ref 0.60–0.95)
Glucose, Bld: 110 mg/dL — ABNORMAL HIGH (ref 65–99)
Potassium: 3.4 mmol/L — ABNORMAL LOW (ref 3.5–5.3)
Sodium: 133 mmol/L — ABNORMAL LOW (ref 135–146)

## 2021-12-01 MED ORDER — POTASSIUM CHLORIDE CRYS ER 10 MEQ PO TBCR: 10.0000 meq | EXTENDED_RELEASE_TABLET | Freq: Every day | ORAL | 1 refills | Status: DC

## 2021-12-01 NOTE — Assessment & Plan Note (Signed)
Her bp is a bit low today.  In light of her recent fall/TBI, I want to be sure that she is not developing orthostasis.  Will decrease lisinopril/hctz from 20-'25mg'$  to 10-12.'5mg'$  once daily.

## 2021-12-01 NOTE — Assessment & Plan Note (Signed)
Lab Results  Component Value Date   CHOL 149 06/27/2021   HDL 77 06/27/2021   LDLCALC 59 06/27/2021   LDLDIRECT 78.0 08/24/2019   TRIG 63 06/27/2021   CHOLHDL 1.9 06/27/2021   Lipids stable on atorvastatin '20mg'$ .  Continue same.

## 2021-12-01 NOTE — Assessment & Plan Note (Signed)
Occurs only when laying down.  Denies post-nasal drip. Recommended trial of omeprazole. If this is not helpful, then we can consider discontinuation of lisinopril for possible ACE cough.

## 2021-12-01 NOTE — Telephone Encounter (Signed)
Potassium is a little low.  I would like her to add kdur 83mq once daily.  We can repeat her level at her follow up visit.

## 2021-12-01 NOTE — Assessment & Plan Note (Signed)
Stable on Detrol LA. Continue same.

## 2021-12-03 NOTE — Telephone Encounter (Signed)
Called but no answer, lvm for patient to call back 

## 2021-12-03 NOTE — Telephone Encounter (Signed)
Patient's daughter called back and she was advised of results, new rx and provider's comments. She verbalized understanding and will let her mother know.

## 2021-12-04 NOTE — Telephone Encounter (Signed)
Opened in error

## 2021-12-21 ENCOUNTER — Ambulatory Visit: Payer: Medicare PPO | Admitting: Family

## 2021-12-21 VITALS — BP 110/62 | HR 96 | Temp 98.1°F | Resp 16 | Wt 137.0 lb

## 2021-12-21 DIAGNOSIS — E876 Hypokalemia: Secondary | ICD-10-CM | POA: Diagnosis not present

## 2021-12-21 DIAGNOSIS — I10 Essential (primary) hypertension: Secondary | ICD-10-CM | POA: Diagnosis not present

## 2021-12-21 DIAGNOSIS — K219 Gastro-esophageal reflux disease without esophagitis: Secondary | ICD-10-CM

## 2021-12-21 NOTE — Progress Notes (Signed)
Subjective:   By signing my name below, I, [name], attest that this documentation has been prepared under the direction and in the presence of [name], NP 12/21/2021   Patient ID: Terry Mcneil, female    DOB: 01/02/1941, 81 y.o.   MRN: [id number]  Chief Complaint  Patient presents with   Hypertension    Follow up after medication dose decreased.     HPI Patient is in today for an office visit. [name] accompanied by her caretaker.   Blood Pressure: Her Zestoretic medication was decreased to 10-12.5 Mg last visit due to falls and low bp. [name] feeling well on this. She denies of any dizziness or falls.  BP Readings from Last 3 Encounters:  12/21/21 110/62  11/30/21 (!) 115/55  09/20/21 135/77   Pulse Readings from Last 3 Encounters:  12/21/21 96  11/30/21 100  09/20/21 88   Reflux: Her reflux symptoms are resolved. [name] currently taking 40 Mg of Prilosec.  Kidney: Her kidney functions are stable Potassium: Her potassium levels are low. [name] currently taking 10 MEQ of Klor-Con M. Lab Results  Component Value Date   K 3.4 (L) 11/30/2021   A1C: Her A1C levels are within range.  Lab Results  Component Value Date   HGBA1C 5.5 11/30/2021    Health Maintenance Due  Topic Date Due   Zoster Vaccines- Shingrix (1 of 2) Never done   COVID-19 Vaccine (3 - Pfizer risk series) 07/20/2019   TETANUS/TDAP  09/09/2020   INFLUENZA VACCINE  12/11/2021    Past Medical History:  Diagnosis Date   Arthritis    back   Cancer ([facility]) 09/2016   left breast cancer   Cataract    Colon polyps    Hypertension    Osteopenia 10/11/2009   Patellar fracture    Right Knee    Urge incontinence     Past Surgical History:  Procedure Laterality Date   BREAST LUMPECTOMY Left 09/2016   COLONOSCOPY     EYE SURGERY Bilateral    Cataracts    HIP CLOSED REDUCTION Left 02/13/2019   Procedure: CLOSED MANIPULATION OF DISLOCATED HIP;  Surgeon: Newt Minion, MD;  Location:  [facility];  Service: Orthopedics;  Laterality: Left;   ORIF PATELLA Right 08/28/2012   Procedure: OPEN REDUCTION INTERNAL (ORIF) FIXATION PATELLA- RIGHT;  Surgeon: Newt Minion, MD;  Location: [facility];  Service: Orthopedics;  Laterality: Right;  Open Reduction Internal Fixation Right [place]   RADIOACTIVE SEED GUIDED PARTIAL MASTECTOMY WITH AXILLARY SENTINEL LYMPH NODE BIOPSY Left 10/03/2016   Procedure: LEFT BREAST RADIOACTIVE SEED GUIDED PARTIAL MASTECTOMY WITH LEFT SENTINEL LYMPH NODE MAPPING;  Surgeon: Erroll Luna, MD;  Location: [facility];  Service: General;  Laterality: Left;   TONSILLECTOMY     TOTAL HIP ARTHROPLASTY Left 01/13/2018   Procedure: LEFT TOTAL HIP ARTHROPLASTY ANTERIOR APPROACH;  Surgeon: Mcarthur Rossetti, MD;  Location: [facility];  Service: Orthopedics;  Laterality: Left;   TUBAL LIGATION     VAGINAL DELIVERY     x2 -     Family History  Problem Relation Age of Onset   Heart disease Father 69       valve replacement    Social History   Socioeconomic History   Marital status: Married    Spouse name: Rozanna Cormany   Number of children: 2   Years of education: Not on file   Highest education level: Not on file  Occupational  History   Occupation: Retired  Tobacco Use   Smoking status: Former    Types: Cigarettes    Quit date: 08/27/1978    Years since quitting: 43.3   Smokeless tobacco: Never  Vaping Use   Vaping Use: Never used  Substance and Sexual Activity   Alcohol use: Yes    Alcohol/week: 7.0 standard drinks of alcohol    Types: 7 Glasses of wine per week   Drug use: No   Sexual activity: Yes  Other Topics Concern   Not on file  Social History Narrative   Marital Status:  Married ([name]) Husband has been diagnosed with Alzheimers.  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     Alcohol Use:  Moderate (Wine), 2 glasses 3 times a week   Drug Use:  None   Diet:  Weight Watchers    Exercise:  Not regular   Hobbies: Reading , Gardening , Grandchildren            Social Determinants of Health   Financial Resource Strain: Low Risk  (12/06/2019)   Overall Financial Resource Strain (CARDIA)    Difficulty of Paying Living Expenses: Not hard at all  Food Insecurity: No Food Insecurity (12/06/2019)   Hunger Vital Sign    Worried About Running Out of Food in the Last Year: Never true    [place] in the Last Year: Never true  Transportation Needs: No Transportation Needs (12/06/2019)   PRAPARE - Hydrologist (Medical): No    Lack of Transportation (Non-Medical): No  Physical Activity: Not on file  Stress: Not on file  Social Connections: Not on file  Intimate Partner Violence: Not on file    Outpatient Medications Prior to Visit  Medication Sig Dispense Refill   acetaminophen (TYLENOL) 325 MG tablet Take 2 tablets (650 mg total) by mouth every 4 (four) hours as needed for mild pain (or temp > 37.5 C (99.5 F)).     atorvastatin (LIPITOR) 20 MG tablet Take 0.5 tablets (10 mg total) by mouth daily. 90 tablet 1   BIOTIN PO Take by mouth.     calcium-vitamin D (OSCAL WITH D) 500-5 MG-MCG tablet Take 1 tablet by mouth.     folic acid (FOLVITE) 1 MG tablet Take 1 mg by mouth daily.     lisinopril-hydrochlorothiazide (ZESTORETIC) 10-12.5 MG tablet Take 1 tablet by mouth daily. 90 tablet 0   Multiple Vitamin (MULTIVITAMIN) tablet Take 1 tablet by mouth daily.     omega-3 acid ethyl esters (LOVAZA) 1 g capsule Take 1 capsule (1 g total) by mouth 2 (two) times daily. 84 capsule 0   omeprazole (PRILOSEC) 40 MG capsule Take 1 capsule (40 mg total) by mouth daily. 30 capsule 3   potassium chloride (KLOR-CON M) 10 MEQ tablet Take 1 tablet (10 mEq  total) by mouth daily. 90 tablet 1   tolterodine (DETROL LA) 4 MG 24 hr capsule TAKE ONE CAPSULE BY MOUTH DAILY 90 capsule 1   UNABLE TO FIND Med Name: tollerine 4 mg     No facility-administered medications prior to visit.    Allergies  Allergen Reactions   Other Other (See Comments)  Sedation = BVM (??) per notes  "Pt with shallow respirations. Assisted by Rt with BVM"   Reclast [Zoledronic Acid]     Developed bony growths in her mouth.     ROS    See HPI Objective:    Physical Exam Constitutional:      General: [name] not in acute distress.    Appearance: Normal appearance. [name] not ill-appearing.  HENT:     Head: Normocephalic and atraumatic.     Right Ear: External ear normal.     Left Ear: External ear normal.  Eyes:     Extraocular Movements: Extraocular movements intact.     Pupils: Pupils are equal, round, and reactive to light.  Cardiovascular:     Rate and Rhythm: Normal rate and regular rhythm.     Heart sounds: Normal heart sounds. No murmur heard.    No gallop.  Pulmonary:     Effort: Pulmonary effort is normal. No respiratory distress.     Breath sounds: Normal breath sounds. No wheezing or rales.  Skin:    General: Skin is warm and dry.  Neurological:     Mental Status: [name] alert and oriented to person, place, and time.  Psychiatric:        Mood and Affect: Mood normal.        Behavior: Behavior normal.        Judgment: Judgment normal.     BP 110/62 (BP Location: Right Arm, Patient Position: Sitting, Cuff Size: Small)   Pulse 96   Temp 98.1 F (36.7 C) (Oral)   Resp 16   Wt 137 lb (62.1 kg)   SpO2 97%   BMI 22.11 kg/m  Wt Readings from Last 3 Encounters:  12/21/21 137 lb (62.1 kg)  11/30/21 137 lb (62.1 kg)  09/20/21 138 lb 8 oz (62.8 kg)       Assessment & Plan:   Problem List Items Addressed This Visit       Unprioritized   Gastroesophageal reflux disease    Reports resolution of cough with addition of omeprazole '40mg'$ .  Continue same.       Essential hypertension, benign - Primary    BP Readings from Last 3 Encounters:  12/21/21 110/62  11/30/21 (!) 115/55  09/20/21 135/77  BP looks OK on decreased dose of zestoretic.  K+ was low last visit. Will check follow up bmet.        Other Visit Diagnoses     Hypokalemia       Relevant Orders   Basic metabolic panel      No orders of the defined types were placed in this encounter.   I, Nance Pear, NP, personally preformed the services described in this documentation.  All medical record entries made by the scribe were at my direction and in my presence.  I have reviewed the chart and discharge instructions (if applicable) and agree that the record reflects my personal performance and is accurate and complete. 12/21/2021   I,Amber Collins,acting as a Education administrator for Nance Pear, NP.,have documented all relevant documentation on the behalf of Nance Pear, NP,as directed by  Nance Pear, NP while in the presence of Nance Pear, NP.    Nance Pear, NP

## 2021-12-21 NOTE — Assessment & Plan Note (Signed)
Reports resolution of cough with addition of omeprazole '40mg'$ . Continue same.

## 2021-12-21 NOTE — Assessment & Plan Note (Signed)
BP Readings from Last 3 Encounters:  12/21/21 110/62  11/30/21 (!) 115/55  09/20/21 135/77   BP looks OK on decreased dose of zestoretic.  K+ was low last visit. Will check follow up bmet.

## 2021-12-22 LAB — BASIC METABOLIC PANEL
BUN/Creatinine Ratio: 26 (calc) — ABNORMAL HIGH (ref 6–22)
BUN: 27 mg/dL — ABNORMAL HIGH (ref 7–25)
CO2: 24 mmol/L (ref 20–32)
Calcium: 9.3 mg/dL (ref 8.6–10.4)
Chloride: 102 mmol/L (ref 98–110)
Creat: 1.03 mg/dL — ABNORMAL HIGH (ref 0.60–0.95)
Glucose, Bld: 112 mg/dL — ABNORMAL HIGH (ref 65–99)
Potassium: 3.9 mmol/L (ref 3.5–5.3)
Sodium: 136 mmol/L (ref 135–146)

## 2021-12-31 ENCOUNTER — Other Ambulatory Visit: Payer: Self-pay | Admitting: Family

## 2022-01-01 ENCOUNTER — Telehealth: Payer: Self-pay | Admitting: Neurology

## 2022-01-01 NOTE — Telephone Encounter (Signed)
LVM and sent mychart msg asking pt to call back to r/s 9/11 appointment - MD out

## 2022-01-15 ENCOUNTER — Other Ambulatory Visit: Payer: Self-pay | Admitting: Family

## 2022-01-21 ENCOUNTER — Ambulatory Visit: Payer: Medicare PPO | Admitting: Neurology

## 2022-01-28 ENCOUNTER — Other Ambulatory Visit: Payer: Self-pay | Admitting: Family

## 2022-02-11 ENCOUNTER — Encounter: Payer: Self-pay | Admitting: Neurology

## 2022-02-11 ENCOUNTER — Ambulatory Visit: Payer: Medicare PPO | Admitting: Neurology

## 2022-02-11 VITALS — BP 148/85 | HR 97 | Ht 66.0 in | Wt 138.0 lb

## 2022-02-11 DIAGNOSIS — S069X9D Unspecified intracranial injury with loss of consciousness of unspecified duration, subsequent encounter: Secondary | ICD-10-CM | POA: Diagnosis not present

## 2022-02-11 DIAGNOSIS — G4700 Insomnia, unspecified: Secondary | ICD-10-CM

## 2022-02-11 DIAGNOSIS — S065XAA Traumatic subdural hemorrhage with loss of consciousness status unknown, initial encounter: Secondary | ICD-10-CM

## 2022-02-11 DIAGNOSIS — F0781 Postconcussional syndrome: Secondary | ICD-10-CM

## 2022-02-11 MED ORDER — ZOLPIDEM TARTRATE ER 6.25 MG PO TBCR: 6.2500 mg | EXTENDED_RELEASE_TABLET | Freq: Every evening | ORAL | 5 refills | Status: DC | PRN
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
## 2022-02-11 NOTE — Patient Instructions (Signed)
Start with Zolpidem at bedtime for sleep aid  Continue your other medications  Increase physical activity  Continue to follow up with PCP  Return as needed

## 2022-02-20 DIAGNOSIS — Z0289 Encounter for other administrative examinations: Secondary | ICD-10-CM

## 2022-02-21 ENCOUNTER — Other Ambulatory Visit: Payer: Self-pay | Admitting: Family

## 2022-02-22 ENCOUNTER — Telehealth: Payer: Self-pay

## 2022-02-22 NOTE — Telephone Encounter (Signed)
[employer] Form has been reviewed and completed by Dr. Frutoso Chase.  Placed in out going medical records bin for processing.

## 2022-02-26 ENCOUNTER — Telehealth: Payer: Self-pay | Admitting: Neurology

## 2022-02-26 NOTE — Telephone Encounter (Signed)
Paperwork faxed to [employer] on 10/17

## 2022-02-27 ENCOUNTER — Other Ambulatory Visit: Payer: Self-pay | Admitting: Family

## 2022-03-10 ENCOUNTER — Encounter: Payer: Self-pay | Admitting: Neurology

## 2022-03-11 ENCOUNTER — Encounter: Payer: Self-pay | Admitting: Family

## 2022-03-11 ENCOUNTER — Other Ambulatory Visit (INDEPENDENT_AMBULATORY_CARE_PROVIDER_SITE_OTHER): Payer: Medicare PPO | Admitting: Family

## 2022-03-11 DIAGNOSIS — G4700 Insomnia, unspecified: Secondary | ICD-10-CM

## 2022-03-11 MED ORDER — TRAZODONE HCL 50 MG PO TABS: 25.0000 mg | ORAL_TABLET | Freq: Every evening | ORAL | 3 refills | Status: DC | PRN

## 2022-03-11 NOTE — Progress Notes (Signed)
Please see the MyChart message reply(ies) for my assessment and plan.  The patient gave consent for this Medical Advice Message and is aware that it may result in a bill to their insurance company as well as the possibility that this may result in a co-payment or deductible. They are an established patient, but are not seeking medical advice exclusively about a problem treated during an in person or video visit in the last 7 days. I did not recommend an in person or video visit within 7 days of my reply.  I spent a total of 5 minutes cumulative provider time within 7 days through MyChart messaging.  Stellah Donovan S O'Sullivan, NP  

## 2022-03-22 ENCOUNTER — Ambulatory Visit: Payer: Medicare PPO | Admitting: Family

## 2022-03-25 ENCOUNTER — Encounter: Payer: Self-pay | Admitting: Family

## 2022-03-25 ENCOUNTER — Ambulatory Visit: Payer: Medicare PPO | Admitting: Family

## 2022-03-25 VITALS — BP 118/75 | HR 92 | Temp 98.2°F | Resp 16 | Wt 140.6 lb

## 2022-03-25 DIAGNOSIS — N3281 Overactive bladder: Secondary | ICD-10-CM | POA: Diagnosis not present

## 2022-03-25 DIAGNOSIS — Z23 Encounter for immunization: Secondary | ICD-10-CM

## 2022-03-25 DIAGNOSIS — E785 Hyperlipidemia, unspecified: Secondary | ICD-10-CM | POA: Diagnosis not present

## 2022-03-25 DIAGNOSIS — G4700 Insomnia, unspecified: Secondary | ICD-10-CM

## 2022-03-25 DIAGNOSIS — K219 Gastro-esophageal reflux disease without esophagitis: Secondary | ICD-10-CM | POA: Diagnosis not present

## 2022-03-25 DIAGNOSIS — I10 Essential (primary) hypertension: Secondary | ICD-10-CM | POA: Diagnosis not present

## 2022-03-25 DIAGNOSIS — M8580 Other specified disorders of bone density and structure, unspecified site: Secondary | ICD-10-CM | POA: Diagnosis not present

## 2022-03-25 LAB — LIPID PANEL
Cholesterol: 155 mg/dL (ref 0–200)
HDL: 64 mg/dL (ref >39.00)
LDL Cholesterol: 73 mg/dL (ref 0–99)
NonHDL: 90.85
Total CHOL/HDL Ratio: 2
Triglycerides: 87 mg/dL (ref 0.0–149.0)
VLDL: 17.4 mg/dL (ref 0.0–40.0)

## 2022-03-25 LAB — COMPREHENSIVE METABOLIC PANEL
ALT: 11 U/L (ref 0–35)
AST: 18 U/L (ref 0–37)
Albumin: 4.2 g/dL (ref 3.5–5.2)
Alkaline Phosphatase: 91 U/L (ref 39–117)
BUN: 25 mg/dL — ABNORMAL HIGH (ref 6–23)
CO2: 28 mEq/L (ref 19–32)
Calcium: 9.6 mg/dL (ref 8.4–10.5)
Chloride: 99 mEq/L (ref 96–112)
Creatinine, Ser: 0.97 mg/dL (ref 0.40–1.20)
GFR: 54.51 mL/min — ABNORMAL LOW (ref >60.00)
Glucose, Bld: 98 mg/dL (ref 70–99)
Potassium: 4.1 mEq/L (ref 3.5–5.1)
Sodium: 136 mEq/L (ref 135–145)
Total Bilirubin: 0.9 mg/dL (ref 0.2–1.2)
Total Protein: 6.6 g/dL (ref 6.0–8.3)

## 2022-03-25 MED ORDER — LISINOPRIL-HYDROCHLOROTHIAZIDE 20-25 MG PO TABS: 1.0000 | ORAL_TABLET | Freq: Every day | ORAL | 1 refills | Status: DC

## 2022-03-25 MED ORDER — POTASSIUM CHLORIDE CRYS ER 10 MEQ PO TBCR: 10.0000 meq | EXTENDED_RELEASE_TABLET | Freq: Every day | ORAL | 1 refills | Status: DC

## 2022-03-25 MED ORDER — OMEGA-3-ACID ETHYL ESTERS 1 G PO CAPS: ORAL_CAPSULE | ORAL | 5 refills | Status: DC

## 2022-03-25 MED ORDER — ATORVASTATIN CALCIUM 20 MG PO TABS: 10.0000 mg | ORAL_TABLET | Freq: Every day | ORAL | 1 refills | Status: DC

## 2022-03-25 MED ORDER — TETANUS-DIPHTHERIA TOXOIDS TD 2-2 LF/0.5ML IM SUSP
0.5000 mL | Freq: Once | INTRAMUSCULAR | 0 refills | Status: AC
Start: 2022-03-25 — End: 2022-03-25

## 2022-03-25 NOTE — Assessment & Plan Note (Signed)
Had Bone density performed back in 3/22 within the [facility] system, again noted osteopenia. Continue calcium.

## 2022-03-25 NOTE — Assessment & Plan Note (Addendum)
Lab Results  Component Value Date   CHOL 149 06/27/2021   HDL 77 06/27/2021   LDLCALC 59 06/27/2021   LDLDIRECT 78.0 08/24/2019   TRIG 63 06/27/2021   CHOLHDL 1.9 06/27/2021   At goal on atorvastatin/lovaza. Continue same.

## 2022-03-25 NOTE — Assessment & Plan Note (Addendum)
Repeat BP at goal. Continue zestoretic.

## 2022-03-25 NOTE — Assessment & Plan Note (Signed)
Stable on Detrol. Continue same.

## 2022-03-25 NOTE — Assessment & Plan Note (Signed)
Improved on trazodone '25mg'$  HS. Continue same.

## 2022-03-25 NOTE — Assessment & Plan Note (Addendum)
Continues prilosec. She will confirm that she is taking every day and if so, we will try stepping her back to '20mg'$  once daily.

## 2022-03-25 NOTE — Progress Notes (Signed)
Subjective:   By signing my name below, I, Terry Mcneil, attest that this documentation has been prepared under the direction and in the presence of Terry Mcneil, 03/25/2022.   Patient ID: Terry Mcneil, female    DOB: 04/14/1941, 81 y.o.   MRN: [id number]  Chief Complaint  Patient presents with   Hypertension    Here for follow up    HPI Patient is in today for an office visit.  Hypertension Patients blood pressure is a little high this visit. [name] compliant with her 10-12.5 mg Zestoretic. Patient is requesting a refill on this medication.   Heartburn Patient reports that she is doing well with reflux and is complaint with her 40 mg prilosec. She states she does not use the medication as frequently and is ready to lower her dosage.  Urinary incontinence Patient reports that she is not having problems with her overactive bladder symptoms with her 4 mg Tolterodine medication.   Cholesterol It is reported that the patients cholesterol levels are normal and she is complaint with her 20 mg Lipitor medication. [name] requesting a refill on her medication.  Insomnia  Patient states that she is sleeping well with her 50 mg Trazodone.   Immunization Patient is receiving an influenza vaccine this visit.  Omega 3 Patient is complaint with her 1 g Lovaza and is requesting a refill.  Health Maintenance Due  Topic Date Due   Zoster Vaccines- Shingrix (1 of 2) Never done   Medicare Annual Wellness (AWV)  04/16/2017   COVID-19 Vaccine (3 - Pfizer risk series) 07/20/2019   TETANUS/TDAP  09/09/2020    Past Medical History:  Diagnosis Date   Arthritis    back   Cancer ([facility]) 09/2016   left breast cancer   Cataract    Colon polyps    Hypertension    Osteopenia 10/11/2009   Patellar fracture    Right Knee    Urge incontinence     Past Surgical History:  Procedure Laterality Date   BREAST LUMPECTOMY Left 09/2016   COLONOSCOPY     EYE SURGERY Bilateral    Cataracts     HIP CLOSED REDUCTION Left 02/13/2019   Procedure: CLOSED MANIPULATION OF DISLOCATED HIP;  Surgeon: Newt Minion, MD;  Location: [place];  Service: Orthopedics;  Laterality: Left;   ORIF PATELLA Right 08/28/2012   Procedure: OPEN REDUCTION INTERNAL (ORIF) FIXATION PATELLA- RIGHT;  Surgeon: Newt Minion, MD;  Location: [place];  Service: Orthopedics;  Laterality: Right;  Open Reduction Internal Fixation Right Patella   RADIOACTIVE SEED GUIDED PARTIAL MASTECTOMY WITH AXILLARY SENTINEL LYMPH NODE BIOPSY Left 10/03/2016   Procedure: LEFT BREAST RADIOACTIVE SEED GUIDED PARTIAL MASTECTOMY WITH LEFT SENTINEL LYMPH NODE MAPPING;  Surgeon: Erroll Luna, MD;  Location: [place];  Service: General;  Laterality: Left;   TONSILLECTOMY     TOTAL HIP ARTHROPLASTY Left 01/13/2018   Procedure: LEFT TOTAL HIP ARTHROPLASTY ANTERIOR APPROACH;  Surgeon: Mcarthur Rossetti, MD;  Location: [place];  Service: Orthopedics;  Laterality: Left;   TUBAL LIGATION     VAGINAL DELIVERY     x2 -     Family History  Problem Relation Age of Onset   Heart disease Father 61       valve replacement    Social History   Socioeconomic History   Marital status: Married    Spouse name: Terry Mcneil   Number of children: 2   Years of education: Not on file  Highest education level: Not on file  Occupational History   Occupation: Retired  Tobacco Use   Smoking status: Former    Types: Cigarettes    Quit date: 08/27/1978    Years since quitting: 43.6   Smokeless tobacco: Never  Vaping Use   Vaping Use: Never used  Substance and Sexual Activity   Alcohol use: Yes    Alcohol/week: 7.0 standard drinks of alcohol    Types: 7 Glasses of wine per week   Drug use: No   Sexual activity: Yes  Other Topics Concern   Not on file  Social History Narrative   Marital Status:  Married ([name]) Husband has been diagnosed with Alzheimers.  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     Alcohol Use:  Moderate (Wine), 2 glasses 3 times a week   Drug Use:  None   Diet:  Weight Watchers    Exercise:  Not regular   Hobbies: Reading , Gardening , Grandchildren            Social Determinants of Health   Financial Resource Strain: Low Risk  (12/06/2019)   Overall Financial Resource Strain (CARDIA)    Difficulty of Paying Living Expenses: Not hard at all  Food Insecurity: No Food Insecurity (12/06/2019)   Hunger Vital Sign    Worried About Running Out of Food in the Last Year: Never true    [place] in the Last Year: Never true  Transportation Needs: No Transportation Needs (12/06/2019)   PRAPARE - Hydrologist (Medical): No    Lack of Transportation (Non-Medical): No  Physical Activity: Not on file  Stress: Not on file  Social Connections: Not on file  Intimate Partner Violence: Not on file    Outpatient Medications Prior to Visit  Medication Sig Dispense Refill   acetaminophen (TYLENOL) 325 MG tablet Take 2 tablets (650 mg total) by mouth every 4 (four) hours as needed for mild pain (or temp > 37.5 C (99.5 F)).     BIOTIN PO Take by mouth.     calcium-vitamin D (OSCAL WITH D) 500-5 MG-MCG tablet Take 1 tablet by mouth.     folic acid (FOLVITE) 1 MG tablet Take 1 mg by mouth daily.     Multiple Vitamin (MULTIVITAMIN) tablet Take 1 tablet by mouth daily.     omeprazole (PRILOSEC) 40 MG capsule TAKE ONE CAPSULE BY MOUTH DAILY 90 capsule 1   tolterodine (DETROL LA) 4 MG 24 hr capsule TAKE ONE CAPSULE BY MOUTH DAILY 90 capsule 1   traZODone (DESYREL) 50 MG tablet Take 0.5-1 tablets (25-50 mg total) by mouth at bedtime as needed for sleep. 30 tablet 3   UNABLE TO FIND Med Name: tollerine 4 mg      atorvastatin (LIPITOR) 20 MG tablet Take 0.5 tablets (10 mg total) by mouth daily. 90 tablet 1   lisinopril-hydrochlorothiazide (ZESTORETIC) 10-12.5 MG tablet Take 1 tablet by mouth daily. 90 tablet 0   lisinopril-hydrochlorothiazide (ZESTORETIC) 20-25 MG tablet TAKE ONE (1) TABLET BY MOUTH EVERY DAY 90 tablet 0   omega-3 acid ethyl esters (LOVAZA) 1 g capsule TAKE ONE (1) CAPSULE BY MOUTH 2 TIMES DAILY 84 capsule  0   potassium chloride (KLOR-CON M) 10 MEQ tablet Take 1 tablet (10 mEq total) by mouth daily. 90 tablet 1   No facility-administered medications prior to visit.    Allergies  Allergen Reactions   Other Other (See Comments)    Sedation = BVM (??) per notes  "Pt with shallow respirations. Assisted by Rt with BVM"   Reclast [Zoledronic Acid]     Developed bony growths in her mouth.     ROS See HPI    Objective:    Physical Exam Constitutional:      General: [name] not in acute distress.    Appearance: Normal appearance. [name] not ill-appearing.  HENT:     Head: Normocephalic and atraumatic.     Right Ear: External ear normal.     Left Ear: External ear normal.  Eyes:     Extraocular Movements: Extraocular movements intact.     Pupils: Pupils are equal, round, and reactive to light.  Cardiovascular:     Rate and Rhythm: Normal rate and regular rhythm.     Heart sounds: Normal heart sounds. No murmur heard.    No gallop.  Pulmonary:     Effort: Pulmonary effort is normal. No respiratory distress.     Breath sounds: Normal breath sounds. No wheezing or rales.  Skin:    General: Skin is warm and dry.  Neurological:     Mental Status: [name] alert and oriented to person, place, and time.  Psychiatric:        Mood and Affect: Mood normal.        Behavior: Behavior normal.        Judgment: Judgment normal.     BP 118/75   Pulse 92   Temp 98.2 F (36.8 C) (Oral)   Resp 16   Wt 140 lb 9.6 oz (63.8 kg)   SpO2 98%   BMI 22.69 kg/m  Wt Readings from Last 3  Encounters:  03/25/22 140 lb 9.6 oz (63.8 kg)  02/11/22 138 lb (62.6 kg)  12/21/21 137 lb (62.1 kg)       Assessment & Plan:   Problem List Items Addressed This Visit       Unprioritized   Overactive bladder    Stable on Detrol. Continue same.       Osteopenia    Had Bone density performed back in 3/22 within the [facility], again noted osteopenia. Continue calcium.       Insomnia    Improved on trazodone 20m HS. Continue same.       Hyperlipidemia    Lab Results  Component Value Date   CHOL 149 06/27/2021   HDL 77 06/27/2021   LDLCALC 59 06/27/2021   LDLDIRECT 78.0 08/24/2019   TRIG 63 06/27/2021   CHOLHDL 1.9 06/27/2021  At goal on atorvastatin/lovaza. Continue same.       Relevant Medications   omega-3 acid ethyl esters (LOVAZA) 1 g capsule   lisinopril-hydrochlorothiazide (ZESTORETIC) 20-25 MG tablet   atorvastatin (LIPITOR) 20 MG tablet   Other Relevant Orders   Lipid panel   Comp Met (CMET)   Gastroesophageal reflux disease    Continues prilosec. She will confirm that she is taking every day and if so, we will try stepping her back to 236monce daily.       Essential hypertension, benign - Primary    Repeat BP at goal. Continue zestoretic.        Relevant Medications   omega-3 acid  ethyl esters (LOVAZA) 1 g capsule   lisinopril-hydrochlorothiazide (ZESTORETIC) 20-25 MG tablet   atorvastatin (LIPITOR) 20 MG tablet   Other Relevant Orders   Comp Met (CMET)   Other Visit Diagnoses     Needs flu shot       Relevant Orders   Flu Vaccine QUAD High Dose(Fluad) (Completed)      Meds ordered this encounter  Medications   diptheria-tetanus toxoids (DECAVAC) 2-2 LF/0.5ML injection    Sig: Inject 0.5 mLs into the muscle once for 1 dose.    Dispense:  0.5 mL    Refill:  0    Order Specific Question:   Supervising Provider    Answer:   Penni Homans A [[id number]]   omega-3 acid ethyl esters (LOVAZA) 1 g capsule    Sig: TAKE ONE (1) CAPSULE BY MOUTH 2  TIMES DAILY    Dispense:  60 capsule    Refill:  5    Order Specific Question:   Supervising Provider    Answer:   Penni Homans A [[id number]]   lisinopril-hydrochlorothiazide (ZESTORETIC) 20-25 MG tablet    Sig: Take 1 tablet by mouth daily.    Dispense:  90 tablet    Refill:  1    Order Specific Question:   Supervising Provider    Answer:   Penni Homans A [[id number]]   potassium chloride (KLOR-CON M) 10 MEQ tablet    Sig: Take 1 tablet (10 mEq total) by mouth daily.    Dispense:  90 tablet    Refill:  1    Order Specific Question:   Supervising Provider    Answer:   Penni Homans A [[id number]]   atorvastatin (LIPITOR) 20 MG tablet    Sig: Take 0.5 tablets (10 mg total) by mouth daily.    Dispense:  90 tablet    Refill:  1    Order Specific Question:   Supervising Provider    Answer:   Pat Patrick, personally preformed the services described in this documentation.  All medical record entries made by the scribe were at my direction and in my presence.  I have reviewed the chart and discharge instructions (if applicable) and agree that the record reflects my personal performance and is accurate and complete. 03/25/2022.   I,Verona Buck,acting as a Education administrator for [employer], NP.,have documented all relevant documentation on the behalf of Nance Pear, NP,as directed by  Nance Pear, NP while in the presence of Nance Pear, NP.    Nance Pear, NP

## 2022-04-01 ENCOUNTER — Encounter: Payer: Self-pay | Admitting: Family

## 2022-06-03 ENCOUNTER — Telehealth: Payer: Self-pay | Admitting: Family

## 2022-06-03 NOTE — Telephone Encounter (Signed)
Patient has been constipated for a week. She has tried multiple things to relieve constipation but no relief. Nurse who comes out to her home recommended she f/u with her provider about possibly getting a prescription to relieve constipation. Please send to [facility] and call patient to advise.

## 2022-06-04 ENCOUNTER — Telehealth (INDEPENDENT_AMBULATORY_CARE_PROVIDER_SITE_OTHER): Payer: Medicare PPO | Admitting: Family

## 2022-06-04 DIAGNOSIS — K5900 Constipation, unspecified: Secondary | ICD-10-CM | POA: Diagnosis not present

## 2022-06-04 NOTE — Assessment & Plan Note (Signed)
New. No improvement with dulcolax or enema.  Recommended the following:  1 capful of miralax in 8 oz of fluid now. If no bm by this evening, repeat 1 capful along with an enema.  Increase water and fiber intake. Call me if no bm by the morning. [name] advised to go to the ER if she develops severe abdominal pain or if she is unable to keep down PO's.  Pt verbalizes understanding.

## 2022-06-04 NOTE — Telephone Encounter (Signed)
Will be set up for today at 10:40 am vv

## 2022-06-04 NOTE — Telephone Encounter (Signed)
Called home and cell numbers but no answer, lvm for patient to call back, and letting her know we are trying to work her in for a virtual visit

## 2022-06-04 NOTE — Progress Notes (Signed)
MyChart Video Visit    Virtual Visit via Video Note   This visit type was conducted due to national recommendations for restrictions regarding the COVID-19 Pandemic (e.g. social distancing) in an effort to limit this patient's exposure and mitigate transmission in our community. This patient is at least at moderate risk for complications without adequate follow up. This format is felt to be most appropriate for this patient at this time. Physical exam was limited by quality of the video and audio technology used for the visit. CMA was able to get the patient set up on a video visit.  Patient location: Home Patient, pt's caregiver and provider in visit Provider location: Office  I discussed the limitations of evaluation and management by telemedicine and the availability of in person appointments. The patient expressed understanding and agreed to proceed.  Visit Date: 06/04/2022  Today's healthcare provider: Nance Pear, NP     Subjective:    Patient ID: Terry Mcneil, female    DOB: 1940/10/16, 82 y.o.   MRN: [id number]  Chief Complaint  Patient presents with   Constipation    Patient reports last bowel movement "about a week ago"    HPI Patient is in today for a sick visit.   Constipation: She reports her last bowel movement was a week ago. She took Bisacodyl yesterday and had an enema to help but was not successful. She expressed concerns about it being a more serious issue. She agreed to go to the ED if she begins to have abdominal pain, vomiting, or diarrhea.   Past Medical History:  Diagnosis Date   Arthritis    back   Cancer ([place]) 09/2016   left breast cancer   Cataract    Colon polyps    Hypertension    Osteopenia 10/11/2009   Patellar fracture    Right Knee    Urge incontinence     Past Surgical History:  Procedure Laterality Date   BREAST LUMPECTOMY Left 09/2016   COLONOSCOPY     EYE SURGERY Bilateral    Cataracts    HIP CLOSED REDUCTION Left  02/13/2019   Procedure: CLOSED MANIPULATION OF DISLOCATED HIP;  Surgeon: Newt Minion, MD;  Location: [place];  Service: Orthopedics;  Laterality: Left;   ORIF PATELLA Right 08/28/2012   Procedure: OPEN REDUCTION INTERNAL (ORIF) FIXATION PATELLA- RIGHT;  Surgeon: Newt Minion, MD;  Location: [facility];  Service: Orthopedics;  Laterality: Right;  Open Reduction Internal Fixation Right Patella   RADIOACTIVE SEED GUIDED PARTIAL MASTECTOMY WITH AXILLARY SENTINEL LYMPH NODE BIOPSY Left 10/03/2016   Procedure: LEFT BREAST RADIOACTIVE SEED GUIDED PARTIAL MASTECTOMY WITH LEFT SENTINEL LYMPH NODE MAPPING;  Surgeon: Erroll Luna, MD;  Location: [place];  Service: General;  Laterality: Left;   TONSILLECTOMY     TOTAL HIP ARTHROPLASTY Left 01/13/2018   Procedure: LEFT TOTAL HIP ARTHROPLASTY ANTERIOR APPROACH;  Surgeon: Mcarthur Rossetti, MD;  Location: [place];  Service: Orthopedics;  Laterality: Left;   TUBAL LIGATION     VAGINAL DELIVERY     x2 -     Family History  Problem Relation Age of Onset   Heart disease Father 72       valve replacement    Social History   Socioeconomic History   Marital status: Married    Spouse name: Jordan Caraveo   Number of children: 2   Years of education: Not on file   Highest education level: Not on file  Occupational  History   Occupation: Retired  Tobacco Use   Smoking status: Former    Types: Cigarettes    Quit date: 08/27/1978    Years since quitting: 43.8   Smokeless tobacco: Never  Vaping Use   Vaping Use: Never used  Substance and Sexual Activity   Alcohol use: Yes    Alcohol/week: 7.0 standard drinks of alcohol    Types: 7 Glasses of wine per week   Drug use: No   Sexual activity: Yes  Other Topics Concern   Not on file  Social History Narrative   Marital Status:  Married ([name]) Husband has been diagnosed with Alzheimers.  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     Alcohol Use:  Moderate (Wine), 2 glasses 3 times a week   Drug Use:  None   Diet:  Weight Watchers    Exercise:  Not regular   Hobbies: Reading , Gardening , Grandchildren            Social Determinants of Health   Financial Resource Strain: Low Risk  (12/06/2019)   Overall Financial Resource Strain (CARDIA)    Difficulty of Paying Living Expenses: Not hard at all  Food Insecurity: No Food Insecurity (12/06/2019)   Hunger Vital Sign    Worried About Running Out of Food in the Last Year: Never true    [place] in the Last Year: Never true  Transportation Needs: No Transportation Needs (12/06/2019)   PRAPARE - Hydrologist (Medical): No    Lack of Transportation (Non-Medical): No  Physical Activity: Not on file  Stress: Not on file  Social Connections: Not on file  Intimate Partner Violence: Not on file    Outpatient Medications Prior to Visit  Medication Sig Dispense Refill   acetaminophen (TYLENOL) 325 MG tablet Take 2 tablets (650 mg total) by mouth every 4 (four) hours as needed for mild pain (or temp > 37.5 C (99.5 F)).     atorvastatin (LIPITOR) 20 MG tablet Take 0.5 tablets (10 mg total) by mouth daily. 90 tablet 1   BIOTIN PO Take by mouth.     calcium-vitamin D (OSCAL WITH D) 500-5 MG-MCG tablet Take 1 tablet by mouth.     folic acid (FOLVITE) 1 MG tablet Take 1 mg by mouth daily.     lisinopril-hydrochlorothiazide (ZESTORETIC) 20-25 MG tablet Take 1 tablet by mouth daily. 90 tablet 1   Multiple Vitamin (MULTIVITAMIN) tablet Take 1 tablet by mouth daily.     omega-3 acid ethyl esters (LOVAZA) 1 g capsule TAKE ONE (1) CAPSULE BY MOUTH 2 TIMES DAILY 60 capsule 5   omeprazole (PRILOSEC) 40 MG capsule TAKE ONE CAPSULE BY MOUTH DAILY 90  capsule 1   potassium chloride (KLOR-CON M) 10 MEQ tablet Take 1 tablet (10 mEq total) by mouth daily. 90 tablet 1   tolterodine (DETROL LA) 4 MG 24 hr capsule TAKE ONE CAPSULE BY MOUTH DAILY 90 capsule 1   traZODone (DESYREL) 50 MG tablet Take 0.5-1 tablets (25-50 mg total) by mouth at bedtime as needed for sleep. 30 tablet 3   UNABLE TO FIND Med Name: tollerine 4 mg     No facility-administered  medications prior to visit.    Allergies  Allergen Reactions   Other Other (See Comments)    Sedation = BVM (??) per notes  "Pt with shallow respirations. Assisted by Rt with BVM"   Reclast [Zoledronic Acid]     Developed bony growths in her mouth.     Review of Systems  Gastrointestinal:  Positive for constipation. Negative for abdominal pain and diarrhea.       Objective:    Physical Exam Constitutional:      General: [name] awake. [name] not in acute distress. Pulmonary:     Effort: Pulmonary effort is normal.  Neurological:     Mental Status: [name] alert and oriented to person, place, and time.     Cranial Nerves: No facial asymmetry.  Psychiatric:        Mood and Affect: Mood normal.        Speech: Speech normal.    There were no vitals taken for this visit. Wt Readings from Last 3 Encounters:  03/25/22 140 lb 9.6 oz (63.8 kg)  02/11/22 138 lb (62.6 kg)  12/21/21 137 lb (62.1 kg)     Assessment & Plan:   Problem List Items Addressed This Visit       Unprioritized   Constipation - Primary    New. No improvement with dulcolax or enema.  Recommended the following:  1 capful of miralax in 8 oz of fluid now. If no bm by this evening, repeat 1 capful along with an enema.  Increase water and fiber intake. Call me if no bm by the morning. [name] advised to go to the ER if she develops severe abdominal pain or if she is unable to keep down PO's.  Pt verbalizes understanding.        No orders of the defined types were placed in this encounter.  I discussed the assessment  and treatment plan with the patient. The patient was provided an opportunity to ask questions and all were answered. The patient agreed with the plan and demonstrated an understanding of the instructions.   The patient was advised to call back or seek an in-person evaluation if the symptoms worsen or if the condition fails to improve as anticipated.  I,Rachel Rivera,acting as a Education administrator for Nance Pear, NP.,have documented all relevant documentation on the behalf of Nance Pear, NP,as directed by  Nance Pear, NP while in the presence of Nance Pear, NP.   Nance Pear, NP [facility] (phone) [phone] (fax)  [place]

## 2022-06-04 NOTE — Telephone Encounter (Signed)
Let's try to put her on for a virtual visit today please so can get more information and decide best treatment. I can see her at 9:20 or 10:40 this AM.

## 2022-06-17 ENCOUNTER — Encounter: Payer: Self-pay | Admitting: Family Medicine

## 2022-06-17 ENCOUNTER — Telehealth (INDEPENDENT_AMBULATORY_CARE_PROVIDER_SITE_OTHER): Payer: Medicare PPO | Admitting: Family Medicine

## 2022-06-17 DIAGNOSIS — R5383 Other fatigue: Secondary | ICD-10-CM

## 2022-06-17 NOTE — Progress Notes (Signed)
Chief Complaint  Patient presents with   Fatigue    Sudden weakness Drinking more water and is feeling better.    Subjective: Patient is a 82 y.o. female here for weakness. Due to COVID-19 pandemic, we are interacting via web portal for an electronic face-to-face visit. I verified patient's ID using 2 identifiers. Patient agreed to proceed with visit via this method. Patient is at home, I am at office. Patient and I are present for visit.   Woke up this AM and felt weak and could not walk. EMS came and took her VS's and things were normal. She feels weak, but feels better overall. She has associated dizziness. No dietary or med changes. She drinks "not enough" water. No recent illness, coughing, SOB, urinary complaints, confusion, falls, N/V/D, bleeding.   Past Medical History:  Diagnosis Date   Arthritis    back   Cancer ([place]) 09/2016   left breast cancer   Cataract    Colon polyps    Hypertension    Osteopenia 10/11/2009   Patellar fracture    Right Knee    Urge incontinence     Objective: No conversational dyspnea Age appropriate judgment and insight Nml affect and mood  Assessment and Plan: Fatigue, unspecified type - Plan: CBC, Comprehensive metabolic panel, TSH  Ck labs. Push fluids. Eat reg meals. If labs are normal and she does not improve, would consider in person visit vs PT. No signs of infection. The patient voiced understanding and agreement to the plan.  [place], DO 06/17/22  4:36 PM

## 2022-06-21 DIAGNOSIS — E785 Hyperlipidemia, unspecified: Secondary | ICD-10-CM | POA: Diagnosis not present

## 2022-06-21 DIAGNOSIS — R29818 Other symptoms and signs involving the nervous system: Secondary | ICD-10-CM | POA: Diagnosis not present

## 2022-06-21 DIAGNOSIS — I081 Rheumatic disorders of both mitral and tricuspid valves: Secondary | ICD-10-CM | POA: Diagnosis not present

## 2022-06-21 DIAGNOSIS — C50812 Malignant neoplasm of overlapping sites of left female breast: Secondary | ICD-10-CM | POA: Diagnosis not present

## 2022-06-21 DIAGNOSIS — R29810 Facial weakness: Secondary | ICD-10-CM | POA: Diagnosis not present

## 2022-06-21 DIAGNOSIS — R9082 White matter disease, unspecified: Secondary | ICD-10-CM | POA: Diagnosis not present

## 2022-06-21 DIAGNOSIS — G9340 Encephalopathy, unspecified: Secondary | ICD-10-CM | POA: Diagnosis not present

## 2022-06-21 DIAGNOSIS — I700 Atherosclerosis of aorta: Secondary | ICD-10-CM | POA: Diagnosis not present

## 2022-06-21 DIAGNOSIS — Z743 Need for continuous supervision: Secondary | ICD-10-CM | POA: Diagnosis not present

## 2022-06-21 DIAGNOSIS — I6522 Occlusion and stenosis of left carotid artery: Secondary | ICD-10-CM | POA: Diagnosis not present

## 2022-06-21 DIAGNOSIS — I672 Cerebral atherosclerosis: Secondary | ICD-10-CM | POA: Diagnosis not present

## 2022-06-21 DIAGNOSIS — J701 Chronic and other pulmonary manifestations due to radiation: Secondary | ICD-10-CM | POA: Diagnosis not present

## 2022-06-21 DIAGNOSIS — R569 Unspecified convulsions: Secondary | ICD-10-CM | POA: Diagnosis not present

## 2022-06-21 DIAGNOSIS — R4702 Dysphasia: Secondary | ICD-10-CM | POA: Diagnosis not present

## 2022-06-21 DIAGNOSIS — N898 Other specified noninflammatory disorders of vagina: Secondary | ICD-10-CM | POA: Diagnosis not present

## 2022-06-21 DIAGNOSIS — I10 Essential (primary) hypertension: Secondary | ICD-10-CM | POA: Diagnosis not present

## 2022-06-21 DIAGNOSIS — G4089 Other seizures: Secondary | ICD-10-CM | POA: Diagnosis not present

## 2022-06-21 DIAGNOSIS — I491 Atrial premature depolarization: Secondary | ICD-10-CM | POA: Diagnosis not present

## 2022-06-21 DIAGNOSIS — Z66 Do not resuscitate: Secondary | ICD-10-CM | POA: Diagnosis not present

## 2022-06-21 DIAGNOSIS — E871 Hypo-osmolality and hyponatremia: Secondary | ICD-10-CM | POA: Diagnosis not present

## 2022-06-21 DIAGNOSIS — Z87891 Personal history of nicotine dependence: Secondary | ICD-10-CM | POA: Diagnosis not present

## 2022-06-21 DIAGNOSIS — S065XAA Traumatic subdural hemorrhage with loss of consciousness status unknown, initial encounter: Secondary | ICD-10-CM | POA: Diagnosis not present

## 2022-06-21 DIAGNOSIS — G459 Transient cerebral ischemic attack, unspecified: Secondary | ICD-10-CM | POA: Diagnosis not present

## 2022-06-21 DIAGNOSIS — E876 Hypokalemia: Secondary | ICD-10-CM | POA: Diagnosis not present

## 2022-06-21 DIAGNOSIS — R55 Syncope and collapse: Secondary | ICD-10-CM | POA: Diagnosis not present

## 2022-06-21 DIAGNOSIS — S065XAS Traumatic subdural hemorrhage with loss of consciousness status unknown, sequela: Secondary | ICD-10-CM | POA: Diagnosis not present

## 2022-06-21 DIAGNOSIS — R9431 Abnormal electrocardiogram [ECG] [EKG]: Secondary | ICD-10-CM | POA: Diagnosis not present

## 2022-06-21 DIAGNOSIS — J90 Pleural effusion, not elsewhere classified: Secondary | ICD-10-CM | POA: Diagnosis not present

## 2022-06-21 DIAGNOSIS — I959 Hypotension, unspecified: Secondary | ICD-10-CM | POA: Diagnosis not present

## 2022-06-21 DIAGNOSIS — R9401 Abnormal electroencephalogram [EEG]: Secondary | ICD-10-CM | POA: Diagnosis not present

## 2022-06-21 DIAGNOSIS — K449 Diaphragmatic hernia without obstruction or gangrene: Secondary | ICD-10-CM | POA: Diagnosis not present

## 2022-06-21 DIAGNOSIS — R4701 Aphasia: Secondary | ICD-10-CM | POA: Diagnosis not present

## 2022-06-21 DIAGNOSIS — J941 Fibrothorax: Secondary | ICD-10-CM | POA: Diagnosis not present

## 2022-06-21 DIAGNOSIS — R4781 Slurred speech: Secondary | ICD-10-CM | POA: Diagnosis not present

## 2022-06-21 DIAGNOSIS — E860 Dehydration: Secondary | ICD-10-CM | POA: Diagnosis not present

## 2022-06-21 DIAGNOSIS — I639 Cerebral infarction, unspecified: Secondary | ICD-10-CM | POA: Diagnosis not present

## 2022-06-21 DIAGNOSIS — I6200 Nontraumatic subdural hemorrhage, unspecified: Secondary | ICD-10-CM | POA: Diagnosis not present

## 2022-06-28 ENCOUNTER — Telehealth: Payer: Self-pay | Admitting: Family

## 2022-06-28 DIAGNOSIS — R4701 Aphasia: Secondary | ICD-10-CM | POA: Diagnosis not present

## 2022-06-28 DIAGNOSIS — R9401 Abnormal electroencephalogram [EEG]: Secondary | ICD-10-CM | POA: Diagnosis not present

## 2022-06-28 DIAGNOSIS — R4781 Slurred speech: Secondary | ICD-10-CM | POA: Diagnosis not present

## 2022-06-28 DIAGNOSIS — Z87820 Personal history of traumatic brain injury: Secondary | ICD-10-CM | POA: Diagnosis not present

## 2022-06-28 DIAGNOSIS — R29810 Facial weakness: Secondary | ICD-10-CM | POA: Diagnosis not present

## 2022-06-28 NOTE — Telephone Encounter (Signed)
Terry Mcneil from [facility] stated pt was referred to PT after being discharged from the hospital. Pt has requested for PT to start 2/23 due to her husband passing. Terry Mcneil stated she does not need a call back as long as pcp is okay with delaying PT.

## 2022-06-28 NOTE — Telephone Encounter (Signed)
noted 

## 2022-07-04 DIAGNOSIS — I10 Essential (primary) hypertension: Secondary | ICD-10-CM | POA: Diagnosis not present

## 2022-07-04 DIAGNOSIS — E876 Hypokalemia: Secondary | ICD-10-CM | POA: Diagnosis not present

## 2022-07-04 DIAGNOSIS — I959 Hypotension, unspecified: Secondary | ICD-10-CM | POA: Diagnosis not present

## 2022-07-05 DIAGNOSIS — E876 Hypokalemia: Secondary | ICD-10-CM | POA: Diagnosis not present

## 2022-07-05 DIAGNOSIS — I10 Essential (primary) hypertension: Secondary | ICD-10-CM | POA: Diagnosis not present

## 2022-07-05 DIAGNOSIS — R569 Unspecified convulsions: Secondary | ICD-10-CM | POA: Diagnosis not present

## 2022-07-06 DIAGNOSIS — R569 Unspecified convulsions: Secondary | ICD-10-CM | POA: Diagnosis not present

## 2022-07-06 DIAGNOSIS — E871 Hypo-osmolality and hyponatremia: Secondary | ICD-10-CM | POA: Diagnosis not present

## 2022-07-06 DIAGNOSIS — Z8679 Personal history of other diseases of the circulatory system: Secondary | ICD-10-CM | POA: Diagnosis not present

## 2022-07-06 DIAGNOSIS — I10 Essential (primary) hypertension: Secondary | ICD-10-CM | POA: Diagnosis not present

## 2022-07-08 ENCOUNTER — Telehealth: Payer: Self-pay | Admitting: Neurology

## 2022-07-08 NOTE — Telephone Encounter (Signed)
Yes, of course, we can switch it to video visit.

## 2022-07-08 NOTE — Telephone Encounter (Signed)
Patient's dtr in law Lattie Haw called in to schedule a follow up with you as she was hospitalized at [facility] over the weekend for a seizure. We scheduled an apt for 3/6, but she's wondering if there's any way it could be a video visit? She said patient is currently bedridden and it would be easier for them to do it that way if at all possible. Please advise, thanks!

## 2022-07-09 ENCOUNTER — Telehealth: Payer: Self-pay | Admitting: Family

## 2022-07-09 DIAGNOSIS — Z7982 Long term (current) use of aspirin: Secondary | ICD-10-CM | POA: Diagnosis not present

## 2022-07-09 DIAGNOSIS — M8589 Other specified disorders of bone density and structure, multiple sites: Secondary | ICD-10-CM | POA: Diagnosis not present

## 2022-07-09 DIAGNOSIS — I959 Hypotension, unspecified: Secondary | ICD-10-CM | POA: Diagnosis not present

## 2022-07-09 DIAGNOSIS — I69392 Facial weakness following cerebral infarction: Secondary | ICD-10-CM | POA: Diagnosis not present

## 2022-07-09 DIAGNOSIS — R1310 Dysphagia, unspecified: Secondary | ICD-10-CM | POA: Diagnosis not present

## 2022-07-09 DIAGNOSIS — G4089 Other seizures: Secondary | ICD-10-CM | POA: Diagnosis not present

## 2022-07-09 DIAGNOSIS — I10 Essential (primary) hypertension: Secondary | ICD-10-CM | POA: Diagnosis not present

## 2022-07-09 DIAGNOSIS — N3281 Overactive bladder: Secondary | ICD-10-CM | POA: Diagnosis not present

## 2022-07-09 DIAGNOSIS — I69320 Aphasia following cerebral infarction: Secondary | ICD-10-CM | POA: Diagnosis not present

## 2022-07-09 NOTE — Telephone Encounter (Signed)
Caller/Agency: [facility] (diane)  Callback Number: [phone] (secure) Requesting OT/PT/Skilled Nursing/Social Work/Speech Therapy: speech therapy and PT Frequency: PT- 2x for 8 weeks and 1x for 1 week

## 2022-07-10 ENCOUNTER — Other Ambulatory Visit: Payer: Self-pay | Admitting: Family

## 2022-07-10 ENCOUNTER — Encounter: Payer: Self-pay | Admitting: Family

## 2022-07-10 MED ORDER — CEPHALEXIN 500 MG PO CAPS: 500.0000 mg | ORAL_CAPSULE | Freq: Two times a day (BID) | ORAL | 0 refills | Status: DC

## 2022-07-10 NOTE — Telephone Encounter (Signed)
Diane advised ok per provider to proceed with therapy

## 2022-07-12 ENCOUNTER — Encounter: Payer: Self-pay | Admitting: Neurology

## 2022-07-12 ENCOUNTER — Telehealth: Payer: Medicare PPO | Admitting: Family

## 2022-07-12 DIAGNOSIS — R350 Frequency of micturition: Secondary | ICD-10-CM | POA: Insufficient documentation

## 2022-07-12 DIAGNOSIS — I4589 Other specified conduction disorders: Secondary | ICD-10-CM | POA: Diagnosis not present

## 2022-07-12 DIAGNOSIS — A499 Bacterial infection, unspecified: Secondary | ICD-10-CM | POA: Diagnosis not present

## 2022-07-12 DIAGNOSIS — R531 Weakness: Secondary | ICD-10-CM | POA: Diagnosis not present

## 2022-07-12 DIAGNOSIS — R569 Unspecified convulsions: Secondary | ICD-10-CM | POA: Diagnosis not present

## 2022-07-12 DIAGNOSIS — R7989 Other specified abnormal findings of blood chemistry: Secondary | ICD-10-CM | POA: Diagnosis not present

## 2022-07-12 DIAGNOSIS — R059 Cough, unspecified: Secondary | ICD-10-CM

## 2022-07-12 DIAGNOSIS — I491 Atrial premature depolarization: Secondary | ICD-10-CM | POA: Diagnosis not present

## 2022-07-12 DIAGNOSIS — N390 Urinary tract infection, site not specified: Secondary | ICD-10-CM | POA: Diagnosis not present

## 2022-07-12 NOTE — Assessment & Plan Note (Signed)
New.  I am concerned about her having suffered a new stroke.  [name] unable to come into the office to complete testing due to her bedbound status.

## 2022-07-12 NOTE — Progress Notes (Addendum)
MyChart Video Visit    Virtual Visit via Video Note     Patient location: Home Patient/daughter in law/hired caregiver and provider in visit Provider location: Office  I discussed the limitations of evaluation and management by telemedicine and the availability of in person appointments. The patient expressed understanding and agreed to proceed.  Visit Date: 07/12/2022  Today's healthcare provider: Nance Pear, NP     Subjective:    Patient ID: Terry Mcneil, female    DOB: 07-05-1940, 82 y.o.   MRN: [id number]  Chief Complaint  Patient presents with   Cough    Patient complains of persistent cough   Urinary Frequency    Complains of urinary frequency and urine discoloration    Altered Mental Status    Patient's daughter reports mental status hanges    Cough  Urinary Frequency  Associated symptoms include frequency.  Altered Mental Status Associated symptoms include coughing and weakness (right side).   Patient is in today for a video visit. Her daughter is present with her during this visit.   Daughter reports patient has been experiencing confusion and memory loss. Patient also has difficulty getting out of bed. She starts shaking when being assisted out of her bed. [name] no longer mobile and cannot even stand without assistance. She continues taking anti-biotics for her UTI but her urine is still a dark green color and she continues experiencing frequency. Her oxygen is consistently measured around 87 percent but the oxygen meter is not working today to check an updated reading. Her blood pressure measured 130/86.   Patients right eye is also drooping since experiencing seizures in her last hospital visit. Last friday she was unresponsive for 12 minutes but came back to normal after 12 minutes. Family called EMS and they came and evaluated her but she ultimately did not go to the hospital.  Her husband unfortunately passed about 10 days ago so she has had  increased stress.  Daughter and care giver note that left side is stronger than her right side. She was right hand dominant prior to experiencing weakness.  She also complains of cough that worsens at night.    Past Medical History:  Diagnosis Date   Arthritis    back   Cancer ([place]) 09/2016   left breast cancer   Cataract    Colon polyps    Hypertension    Osteopenia 10/11/2009   Patellar fracture    Right Knee    Urge incontinence     Past Surgical History:  Procedure Laterality Date   BREAST LUMPECTOMY Left 09/2016   COLONOSCOPY     EYE SURGERY Bilateral    Cataracts    HIP CLOSED REDUCTION Left 02/13/2019   Procedure: CLOSED MANIPULATION OF DISLOCATED HIP;  Surgeon: Newt Minion, MD;  Location: [facility];  Service: Orthopedics;  Laterality: Left;   ORIF PATELLA Right 08/28/2012   Procedure: OPEN REDUCTION INTERNAL (ORIF) FIXATION PATELLA- RIGHT;  Surgeon: Newt Minion, MD;  Location: [facility];  Service: Orthopedics;  Laterality: Right;  Open Reduction Internal Fixation Right [place]   RADIOACTIVE SEED GUIDED PARTIAL MASTECTOMY WITH AXILLARY SENTINEL LYMPH NODE BIOPSY Left 10/03/2016   Procedure: LEFT BREAST RADIOACTIVE SEED GUIDED PARTIAL MASTECTOMY WITH LEFT SENTINEL LYMPH NODE MAPPING;  Surgeon: Erroll Luna, MD;  Location: [facility];  Service: General;  Laterality: Left;   TONSILLECTOMY     TOTAL HIP ARTHROPLASTY Left 01/13/2018   Procedure: LEFT TOTAL HIP ARTHROPLASTY ANTERIOR APPROACH;  Surgeon: Mcarthur Rossetti, MD;  Location: [facility];  Service: Orthopedics;  Laterality: Left;   TUBAL LIGATION     VAGINAL DELIVERY     x2 -     Family History  Problem Relation Age of Onset   Heart disease Father 64       valve replacement    Social History   Socioeconomic History   Marital status: Widowed    Spouse name: Sandi Mieles   Number of children: 2   Years of education: Not on file   Highest education level: Not on file  Occupational History    Occupation: Retired  Tobacco Use   Smoking status: Former    Types: Cigarettes    Quit date: 08/27/1978    Years since quitting: 43.9   Smokeless tobacco: Never  Vaping Use   Vaping Use: Never used  Substance and Sexual Activity   Alcohol use: Yes    Alcohol/week: 7.0 standard drinks of alcohol    Types: 7 Glasses of wine per week   Drug use: No   Sexual activity: Yes  Other Topics Concern   Not on file  Social History Narrative   Marital Status:  Married ([name]) Husband has been diagnosed with Alzheimers.  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     Alcohol Use:  Moderate (Wine), 2 glasses 3 times a week   Drug Use:  None   Diet:  Weight Watchers    Exercise:  Not regular   Hobbies: Reading , Gardening , Grandchildren            Social Determinants of Health   Financial Resource Strain: Low Risk  (12/06/2019)   Overall Financial Resource Strain (CARDIA)    Difficulty of Paying Living Expenses: Not hard at all  Food Insecurity: No Food Insecurity (12/06/2019)   Hunger Vital Sign    Worried About Running Out of Food in the Last Year: Never true    [place] in the Last Year: Never true  Transportation Needs: No Transportation Needs (12/06/2019)   PRAPARE - Hydrologist (Medical): No    Lack of Transportation (Non-Medical): No  Physical Activity: Not on file  Stress: Not on file  Social Connections: Not on file  Intimate Partner Violence: Not on file    Outpatient Medications Prior to Visit  Medication Sig Dispense Refill   BIOTIN PO Take by mouth.     calcium-vitamin D (OSCAL WITH D) 500-5 MG-MCG tablet Take 1 tablet by mouth.     folic acid (FOLVITE) 1 MG tablet Take 1 mg by  mouth daily.     Multiple Vitamin (MULTIVITAMIN) tablet Take 1 tablet by mouth daily.     atorvastatin (LIPITOR) 20 MG tablet Take 0.5 tablets (10 mg total) by mouth daily. 90 tablet 1   acetaminophen (TYLENOL) 325 MG tablet Take 2 tablets (650 mg total) by mouth every 4 (four) hours as needed for mild pain (or temp > 37.5 C (99.5 F)).     cephALEXin (KEFLEX) 500 MG capsule Take 1 capsule (500 mg total) by mouth 2 (two) times daily. 10 capsule 0   lisinopril-hydrochlorothiazide (ZESTORETIC) 20-25 MG tablet Take 1 tablet  by mouth daily. 90 tablet 1   omega-3 acid ethyl esters (LOVAZA) 1 g capsule TAKE ONE (1) CAPSULE BY MOUTH 2 TIMES DAILY 60 capsule 5   omeprazole (PRILOSEC) 40 MG capsule TAKE ONE CAPSULE BY MOUTH DAILY 90 capsule 1   potassium chloride (KLOR-CON M) 10 MEQ tablet Take 1 tablet (10 mEq total) by mouth daily. 90 tablet 1   tolterodine (DETROL LA) 4 MG 24 hr capsule TAKE ONE CAPSULE BY MOUTH DAILY 90 capsule 1   traZODone (DESYREL) 50 MG tablet Take 0.5-1 tablets (25-50 mg total) by mouth at bedtime as needed for sleep. 30 tablet 3   UNABLE TO FIND Med Name: tollerine 4 mg     No facility-administered medications prior to visit.    Allergies  Allergen Reactions   Other Other (See Comments)    Sedation = BVM (??) per notes  "Pt with shallow respirations. Assisted by Rt with BVM"   Reclast [Zoledronic Acid]     Developed bony growths in her mouth.     Review of Systems  Respiratory:  Positive for cough.   Genitourinary:  Positive for frequency.  Neurological:  Positive for weakness (right side).       (+)right eye drooping  Psychiatric/Behavioral:  Positive for memory loss.        Objective:    Physical Exam Constitutional:      General: [name] not in acute distress. HENT:     Head: Normocephalic.  Neurological:     Mental Status: [name] alert.     Cranial Nerves: No cranial nerve deficit.     Comments: Right facial drooping is noted Speech is clear.      There were no vitals taken for this visit. Wt Readings from Last 3 Encounters:  03/25/22 140 lb 9.6 oz (63.8 kg)  02/11/22 138 lb (62.6 kg)  12/21/21 137 lb (62.1 kg)       Assessment & Plan:  Right sided weakness Assessment & Plan: New.  I am concerned about her having suffered a new stroke.  [name] unable to come into the office to complete testing due to her bedbound status.     Urinary frequency Assessment & Plan: [name] being treated with keflex, so far no significant improvement.    Cough, unspecified type Assessment & Plan: Given reports of low oxygen saturation, I am concerned about the possibility of volume overload or PNA.  I have advised daughter in law to call 911. She will need evaluation for CVA/PNA/UTI and possible consideration for SNF placement. Once we get a handle on what is going on with her health, the daughter and I discussed a possible palliative care consulation. They used [facility] [place] [facility] her husband recently and really liked the care they received.    20 minutes spent on today's visit. Time was spent counseling patient/daughter in law and interviewing patient/daughter in law.    Addendum:  Daughter-in-law would like hospital bed for patient due to her decreased mobility.  I expect she will need the bed for >1 year.  This will help the family and patient with bed positioning and safety since she is bedbound.   I discussed the assessment and treatment plan with the patient. The patient was provided an opportunity to ask questions and all were answered. The patient agreed with the plan and demonstrated an understanding of the instructions.   The patient was advised to call back or seek an in-person evaluation if the symptoms worsen  or if the condition fails to improve as anticipated.  Nance Pear, NP [facility] (phone) [phone] (fax)  [place]     I,[name],acting as a scribe for Nance Pear, NP.,have documented all relevant documentation on the behalf of Nance Pear, NP,as directed by  Nance Pear, NP while in the presence of Nance Pear, NP.

## 2022-07-12 NOTE — Assessment & Plan Note (Signed)
[name] being treated with keflex, so far no significant improvement.

## 2022-07-12 NOTE — Assessment & Plan Note (Signed)
Given reports of low oxygen saturation, I am concerned about the possibility of volume overload or PNA.  I have advised daughter in law to call 911. She will need evaluation for CVA/PNA/UTI and possible consideration for SNF placement. Once we get a handle on what is going on with her health, the daughter and I discussed a possible palliative care consulation. They used [facility] [place] for her husband recently and really liked the care they received.

## 2022-07-12 NOTE — Telephone Encounter (Signed)
Pt's daughter Terry Mcneil asking while pt is in the hospital there any test would need to done while she is in the hospital for her appt on 07/17/22 for Dr. April Manson. Would like a call from nurse.

## 2022-07-15 ENCOUNTER — Encounter: Payer: Self-pay | Admitting: Family

## 2022-07-15 ENCOUNTER — Telehealth: Payer: Self-pay | Admitting: Family

## 2022-07-15 DIAGNOSIS — R1310 Dysphagia, unspecified: Secondary | ICD-10-CM | POA: Diagnosis not present

## 2022-07-15 DIAGNOSIS — M8589 Other specified disorders of bone density and structure, multiple sites: Secondary | ICD-10-CM | POA: Diagnosis not present

## 2022-07-15 DIAGNOSIS — I69392 Facial weakness following cerebral infarction: Secondary | ICD-10-CM | POA: Diagnosis not present

## 2022-07-15 DIAGNOSIS — I10 Essential (primary) hypertension: Secondary | ICD-10-CM | POA: Diagnosis not present

## 2022-07-15 DIAGNOSIS — I69320 Aphasia following cerebral infarction: Secondary | ICD-10-CM | POA: Diagnosis not present

## 2022-07-15 DIAGNOSIS — I959 Hypotension, unspecified: Secondary | ICD-10-CM | POA: Diagnosis not present

## 2022-07-15 DIAGNOSIS — N3281 Overactive bladder: Secondary | ICD-10-CM | POA: Diagnosis not present

## 2022-07-15 DIAGNOSIS — Z7982 Long term (current) use of aspirin: Secondary | ICD-10-CM | POA: Diagnosis not present

## 2022-07-15 DIAGNOSIS — G4089 Other seizures: Secondary | ICD-10-CM | POA: Diagnosis not present

## 2022-07-15 DIAGNOSIS — G40909 Epilepsy, unspecified, not intractable, without status epilepticus: Secondary | ICD-10-CM

## 2022-07-15 NOTE — Telephone Encounter (Signed)
Left a message for daughter informing her that there were actually no additional tests needed while she was in the ED. I will see them on the 6 as scheduled.

## 2022-07-15 NOTE — Telephone Encounter (Signed)
Terry Mcneil from [facility] stated the family is requesting eval for palliative care. Her vm is secure.

## 2022-07-16 DIAGNOSIS — M8589 Other specified disorders of bone density and structure, multiple sites: Secondary | ICD-10-CM | POA: Diagnosis not present

## 2022-07-16 DIAGNOSIS — I959 Hypotension, unspecified: Secondary | ICD-10-CM | POA: Diagnosis not present

## 2022-07-16 DIAGNOSIS — Z7982 Long term (current) use of aspirin: Secondary | ICD-10-CM | POA: Diagnosis not present

## 2022-07-16 DIAGNOSIS — N3281 Overactive bladder: Secondary | ICD-10-CM | POA: Diagnosis not present

## 2022-07-16 DIAGNOSIS — I10 Essential (primary) hypertension: Secondary | ICD-10-CM | POA: Diagnosis not present

## 2022-07-16 DIAGNOSIS — I69392 Facial weakness following cerebral infarction: Secondary | ICD-10-CM | POA: Diagnosis not present

## 2022-07-16 DIAGNOSIS — I69320 Aphasia following cerebral infarction: Secondary | ICD-10-CM | POA: Diagnosis not present

## 2022-07-16 DIAGNOSIS — G4089 Other seizures: Secondary | ICD-10-CM | POA: Diagnosis not present

## 2022-07-16 DIAGNOSIS — R1310 Dysphagia, unspecified: Secondary | ICD-10-CM | POA: Diagnosis not present

## 2022-07-16 NOTE — Telephone Encounter (Signed)
Referral has been placed. 

## 2022-07-17 ENCOUNTER — Telehealth (INDEPENDENT_AMBULATORY_CARE_PROVIDER_SITE_OTHER): Payer: Medicare PPO | Admitting: Neurology

## 2022-07-17 ENCOUNTER — Encounter: Payer: Self-pay | Admitting: Neurology

## 2022-07-17 DIAGNOSIS — I10 Essential (primary) hypertension: Secondary | ICD-10-CM | POA: Diagnosis not present

## 2022-07-17 DIAGNOSIS — S069X9D Unspecified intracranial injury with loss of consciousness of unspecified duration, subsequent encounter: Secondary | ICD-10-CM | POA: Diagnosis not present

## 2022-07-17 DIAGNOSIS — N3281 Overactive bladder: Secondary | ICD-10-CM | POA: Diagnosis not present

## 2022-07-17 DIAGNOSIS — M8589 Other specified disorders of bone density and structure, multiple sites: Secondary | ICD-10-CM | POA: Diagnosis not present

## 2022-07-17 DIAGNOSIS — G4700 Insomnia, unspecified: Secondary | ICD-10-CM

## 2022-07-17 DIAGNOSIS — I959 Hypotension, unspecified: Secondary | ICD-10-CM | POA: Diagnosis not present

## 2022-07-17 DIAGNOSIS — Z7982 Long term (current) use of aspirin: Secondary | ICD-10-CM | POA: Diagnosis not present

## 2022-07-17 DIAGNOSIS — R1310 Dysphagia, unspecified: Secondary | ICD-10-CM | POA: Diagnosis not present

## 2022-07-17 DIAGNOSIS — G4089 Other seizures: Secondary | ICD-10-CM | POA: Diagnosis not present

## 2022-07-17 DIAGNOSIS — I69392 Facial weakness following cerebral infarction: Secondary | ICD-10-CM | POA: Diagnosis not present

## 2022-07-17 DIAGNOSIS — G40909 Epilepsy, unspecified, not intractable, without status epilepticus: Secondary | ICD-10-CM

## 2022-07-17 DIAGNOSIS — I69320 Aphasia following cerebral infarction: Secondary | ICD-10-CM | POA: Diagnosis not present

## 2022-07-17 NOTE — Patient Instructions (Signed)
Continue with Keppra 250 mg twice daily Continue with lacosamide 100 mg twice daily Continue your other medications Increase trazodone to 1-1/2 to 2 tablets to see if he will help with the insomnia Continue with PT, OT and speech Follow-up in 2 to 3 months
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
## 2022-07-18 ENCOUNTER — Telehealth: Payer: Self-pay

## 2022-07-18 DIAGNOSIS — M8589 Other specified disorders of bone density and structure, multiple sites: Secondary | ICD-10-CM | POA: Diagnosis not present

## 2022-07-18 DIAGNOSIS — N3281 Overactive bladder: Secondary | ICD-10-CM | POA: Diagnosis not present

## 2022-07-18 DIAGNOSIS — I10 Essential (primary) hypertension: Secondary | ICD-10-CM | POA: Diagnosis not present

## 2022-07-18 DIAGNOSIS — I69392 Facial weakness following cerebral infarction: Secondary | ICD-10-CM | POA: Diagnosis not present

## 2022-07-18 DIAGNOSIS — R1310 Dysphagia, unspecified: Secondary | ICD-10-CM | POA: Diagnosis not present

## 2022-07-18 DIAGNOSIS — G4089 Other seizures: Secondary | ICD-10-CM | POA: Diagnosis not present

## 2022-07-18 DIAGNOSIS — I959 Hypotension, unspecified: Secondary | ICD-10-CM | POA: Diagnosis not present

## 2022-07-18 DIAGNOSIS — I69320 Aphasia following cerebral infarction: Secondary | ICD-10-CM | POA: Diagnosis not present

## 2022-07-18 DIAGNOSIS — Z7982 Long term (current) use of aspirin: Secondary | ICD-10-CM | POA: Diagnosis not present

## 2022-07-18 NOTE — Telephone Encounter (Signed)
Spoke w/ Jarrett Soho, ST w/ [place]- verbal orders given to ST.

## 2022-07-19 ENCOUNTER — Other Ambulatory Visit: Payer: Self-pay

## 2022-07-20 MED ORDER — TRAZODONE HCL 50 MG PO TABS: 50.0000 mg | ORAL_TABLET | Freq: Every day | ORAL | 1 refills | Status: DC

## 2022-07-21 DIAGNOSIS — I959 Hypotension, unspecified: Secondary | ICD-10-CM | POA: Diagnosis not present

## 2022-07-21 DIAGNOSIS — R1310 Dysphagia, unspecified: Secondary | ICD-10-CM | POA: Diagnosis not present

## 2022-07-21 DIAGNOSIS — I69392 Facial weakness following cerebral infarction: Secondary | ICD-10-CM | POA: Diagnosis not present

## 2022-07-21 DIAGNOSIS — Z9181 History of falling: Secondary | ICD-10-CM

## 2022-07-21 DIAGNOSIS — Z853 Personal history of malignant neoplasm of breast: Secondary | ICD-10-CM

## 2022-07-21 DIAGNOSIS — I10 Essential (primary) hypertension: Secondary | ICD-10-CM | POA: Diagnosis not present

## 2022-07-21 DIAGNOSIS — M8589 Other specified disorders of bone density and structure, multiple sites: Secondary | ICD-10-CM | POA: Diagnosis not present

## 2022-07-21 DIAGNOSIS — Z7982 Long term (current) use of aspirin: Secondary | ICD-10-CM | POA: Diagnosis not present

## 2022-07-21 DIAGNOSIS — Z87891 Personal history of nicotine dependence: Secondary | ICD-10-CM

## 2022-07-21 DIAGNOSIS — I69320 Aphasia following cerebral infarction: Secondary | ICD-10-CM | POA: Diagnosis not present

## 2022-07-21 DIAGNOSIS — N3281 Overactive bladder: Secondary | ICD-10-CM | POA: Diagnosis not present

## 2022-07-21 DIAGNOSIS — G4089 Other seizures: Secondary | ICD-10-CM | POA: Diagnosis not present

## 2022-07-23 ENCOUNTER — Telehealth: Payer: Self-pay | Admitting: Family

## 2022-07-23 DIAGNOSIS — I959 Hypotension, unspecified: Secondary | ICD-10-CM | POA: Diagnosis not present

## 2022-07-23 DIAGNOSIS — I69320 Aphasia following cerebral infarction: Secondary | ICD-10-CM | POA: Diagnosis not present

## 2022-07-23 DIAGNOSIS — R1310 Dysphagia, unspecified: Secondary | ICD-10-CM | POA: Diagnosis not present

## 2022-07-23 DIAGNOSIS — G4089 Other seizures: Secondary | ICD-10-CM | POA: Diagnosis not present

## 2022-07-23 DIAGNOSIS — Z7982 Long term (current) use of aspirin: Secondary | ICD-10-CM | POA: Diagnosis not present

## 2022-07-23 DIAGNOSIS — I10 Essential (primary) hypertension: Secondary | ICD-10-CM | POA: Diagnosis not present

## 2022-07-23 DIAGNOSIS — N3281 Overactive bladder: Secondary | ICD-10-CM | POA: Diagnosis not present

## 2022-07-23 DIAGNOSIS — I69392 Facial weakness following cerebral infarction: Secondary | ICD-10-CM | POA: Diagnosis not present

## 2022-07-23 DIAGNOSIS — M8589 Other specified disorders of bone density and structure, multiple sites: Secondary | ICD-10-CM | POA: Diagnosis not present

## 2022-07-23 NOTE — Telephone Encounter (Signed)
Prescription Request  07/23/2022  Is this a "Controlled Substance" medicine? No  LOV: 03/25/2022  What is the name of the medication or equipment?   midodrine (PROAMATINE) 2.5 MG tablet [id number]   Have you contacted your pharmacy to request a refill? No   Which pharmacy would you like this sent to?   [facility] [address], [place], [place] [address] P: [phone]  Patient notified that their request is being sent to the clinical staff for review and that they should receive a response within 2 business days.   Please advise at Mobile [phone] (mobile)

## 2022-07-24 MED ORDER — MIDODRINE HCL 2.5 MG PO TABS: 2.5000 mg | ORAL_TABLET | Freq: Three times a day (TID) | ORAL | 2 refills | Status: DC

## 2022-07-24 NOTE — Addendum Note (Signed)
Addended by: [name] on: 07/24/2022 04:21 PM   Modules accepted: Orders

## 2022-07-25 ENCOUNTER — Other Ambulatory Visit: Payer: Self-pay

## 2022-07-25 ENCOUNTER — Telehealth: Payer: Self-pay | Admitting: Family

## 2022-07-25 DIAGNOSIS — Z7982 Long term (current) use of aspirin: Secondary | ICD-10-CM | POA: Diagnosis not present

## 2022-07-25 DIAGNOSIS — I959 Hypotension, unspecified: Secondary | ICD-10-CM | POA: Diagnosis not present

## 2022-07-25 DIAGNOSIS — I69320 Aphasia following cerebral infarction: Secondary | ICD-10-CM | POA: Diagnosis not present

## 2022-07-25 DIAGNOSIS — M8589 Other specified disorders of bone density and structure, multiple sites: Secondary | ICD-10-CM | POA: Diagnosis not present

## 2022-07-25 DIAGNOSIS — I69392 Facial weakness following cerebral infarction: Secondary | ICD-10-CM | POA: Diagnosis not present

## 2022-07-25 DIAGNOSIS — I10 Essential (primary) hypertension: Secondary | ICD-10-CM | POA: Diagnosis not present

## 2022-07-25 DIAGNOSIS — R1310 Dysphagia, unspecified: Secondary | ICD-10-CM | POA: Diagnosis not present

## 2022-07-25 DIAGNOSIS — G4089 Other seizures: Secondary | ICD-10-CM | POA: Diagnosis not present

## 2022-07-25 DIAGNOSIS — N3281 Overactive bladder: Secondary | ICD-10-CM | POA: Diagnosis not present

## 2022-07-25 MED ORDER — TRAZODONE HCL 50 MG PO TABS: 50.0000 mg | ORAL_TABLET | Freq: Every day | ORAL | 1 refills | Status: DC

## 2022-07-25 MED ORDER — MIDODRINE HCL 2.5 MG PO TABS: 2.5000 mg | ORAL_TABLET | Freq: Three times a day (TID) | ORAL | 2 refills | Status: DC

## 2022-07-25 NOTE — Telephone Encounter (Signed)
Verbal orders given ok per provider.

## 2022-07-25 NOTE — Telephone Encounter (Signed)
Caller/Agency: Joellen Jersey, [facility] Number: [phone] Requesting OT/PT/Skilled Nursing/Social Work/Speech Therapy: OT  Frequency: 1x8  Ok to leave message.

## 2022-07-26 ENCOUNTER — Other Ambulatory Visit: Payer: Self-pay | Admitting: Family

## 2022-07-26 DIAGNOSIS — R1310 Dysphagia, unspecified: Secondary | ICD-10-CM | POA: Diagnosis not present

## 2022-07-26 DIAGNOSIS — R413 Other amnesia: Secondary | ICD-10-CM

## 2022-07-26 DIAGNOSIS — I959 Hypotension, unspecified: Secondary | ICD-10-CM | POA: Diagnosis not present

## 2022-07-26 DIAGNOSIS — R531 Weakness: Secondary | ICD-10-CM

## 2022-07-26 DIAGNOSIS — N3281 Overactive bladder: Secondary | ICD-10-CM | POA: Diagnosis not present

## 2022-07-26 DIAGNOSIS — Z7982 Long term (current) use of aspirin: Secondary | ICD-10-CM | POA: Diagnosis not present

## 2022-07-26 DIAGNOSIS — G40909 Epilepsy, unspecified, not intractable, without status epilepticus: Secondary | ICD-10-CM

## 2022-07-26 DIAGNOSIS — I10 Essential (primary) hypertension: Secondary | ICD-10-CM | POA: Diagnosis not present

## 2022-07-26 DIAGNOSIS — G4089 Other seizures: Secondary | ICD-10-CM | POA: Diagnosis not present

## 2022-07-26 DIAGNOSIS — I69392 Facial weakness following cerebral infarction: Secondary | ICD-10-CM | POA: Diagnosis not present

## 2022-07-26 DIAGNOSIS — I69320 Aphasia following cerebral infarction: Secondary | ICD-10-CM | POA: Diagnosis not present

## 2022-07-26 DIAGNOSIS — M8589 Other specified disorders of bone density and structure, multiple sites: Secondary | ICD-10-CM | POA: Diagnosis not present

## 2022-07-26 NOTE — Progress Notes (Signed)
Community message sent to 4 of the [facility] representatives for them to be aware of orders.

## 2022-07-26 NOTE — Progress Notes (Unsigned)
Please reach out to [facility] RE: order for hospital bed.

## 2022-07-31 DIAGNOSIS — I10 Essential (primary) hypertension: Secondary | ICD-10-CM | POA: Diagnosis not present

## 2022-07-31 DIAGNOSIS — I69320 Aphasia following cerebral infarction: Secondary | ICD-10-CM | POA: Diagnosis not present

## 2022-07-31 DIAGNOSIS — Z7982 Long term (current) use of aspirin: Secondary | ICD-10-CM | POA: Diagnosis not present

## 2022-07-31 DIAGNOSIS — I69392 Facial weakness following cerebral infarction: Secondary | ICD-10-CM | POA: Diagnosis not present

## 2022-07-31 DIAGNOSIS — G4089 Other seizures: Secondary | ICD-10-CM | POA: Diagnosis not present

## 2022-07-31 DIAGNOSIS — N3281 Overactive bladder: Secondary | ICD-10-CM | POA: Diagnosis not present

## 2022-07-31 DIAGNOSIS — R1310 Dysphagia, unspecified: Secondary | ICD-10-CM | POA: Diagnosis not present

## 2022-07-31 DIAGNOSIS — I959 Hypotension, unspecified: Secondary | ICD-10-CM | POA: Diagnosis not present

## 2022-07-31 DIAGNOSIS — M8589 Other specified disorders of bone density and structure, multiple sites: Secondary | ICD-10-CM | POA: Diagnosis not present

## 2022-08-01 ENCOUNTER — Encounter: Payer: Self-pay | Admitting: Family

## 2022-08-01 DIAGNOSIS — I69320 Aphasia following cerebral infarction: Secondary | ICD-10-CM | POA: Diagnosis not present

## 2022-08-01 DIAGNOSIS — Z7982 Long term (current) use of aspirin: Secondary | ICD-10-CM | POA: Diagnosis not present

## 2022-08-01 DIAGNOSIS — I69392 Facial weakness following cerebral infarction: Secondary | ICD-10-CM | POA: Diagnosis not present

## 2022-08-01 DIAGNOSIS — I10 Essential (primary) hypertension: Secondary | ICD-10-CM | POA: Diagnosis not present

## 2022-08-01 DIAGNOSIS — N3281 Overactive bladder: Secondary | ICD-10-CM | POA: Diagnosis not present

## 2022-08-01 DIAGNOSIS — G4089 Other seizures: Secondary | ICD-10-CM | POA: Diagnosis not present

## 2022-08-01 DIAGNOSIS — R1310 Dysphagia, unspecified: Secondary | ICD-10-CM | POA: Diagnosis not present

## 2022-08-01 DIAGNOSIS — M8589 Other specified disorders of bone density and structure, multiple sites: Secondary | ICD-10-CM | POA: Diagnosis not present

## 2022-08-01 DIAGNOSIS — I959 Hypotension, unspecified: Secondary | ICD-10-CM | POA: Diagnosis not present

## 2022-08-02 DIAGNOSIS — I69392 Facial weakness following cerebral infarction: Secondary | ICD-10-CM | POA: Diagnosis not present

## 2022-08-02 DIAGNOSIS — N3281 Overactive bladder: Secondary | ICD-10-CM | POA: Diagnosis not present

## 2022-08-02 DIAGNOSIS — R1310 Dysphagia, unspecified: Secondary | ICD-10-CM | POA: Diagnosis not present

## 2022-08-02 DIAGNOSIS — Z7982 Long term (current) use of aspirin: Secondary | ICD-10-CM | POA: Diagnosis not present

## 2022-08-02 DIAGNOSIS — I10 Essential (primary) hypertension: Secondary | ICD-10-CM | POA: Diagnosis not present

## 2022-08-02 DIAGNOSIS — I959 Hypotension, unspecified: Secondary | ICD-10-CM | POA: Diagnosis not present

## 2022-08-02 DIAGNOSIS — G4089 Other seizures: Secondary | ICD-10-CM | POA: Diagnosis not present

## 2022-08-02 DIAGNOSIS — M8589 Other specified disorders of bone density and structure, multiple sites: Secondary | ICD-10-CM | POA: Diagnosis not present

## 2022-08-02 DIAGNOSIS — I69320 Aphasia following cerebral infarction: Secondary | ICD-10-CM | POA: Diagnosis not present

## 2022-08-06 DIAGNOSIS — I69320 Aphasia following cerebral infarction: Secondary | ICD-10-CM | POA: Diagnosis not present

## 2022-08-06 DIAGNOSIS — R1310 Dysphagia, unspecified: Secondary | ICD-10-CM | POA: Diagnosis not present

## 2022-08-06 DIAGNOSIS — I10 Essential (primary) hypertension: Secondary | ICD-10-CM | POA: Diagnosis not present

## 2022-08-06 DIAGNOSIS — N3281 Overactive bladder: Secondary | ICD-10-CM | POA: Diagnosis not present

## 2022-08-06 DIAGNOSIS — Z7982 Long term (current) use of aspirin: Secondary | ICD-10-CM | POA: Diagnosis not present

## 2022-08-06 DIAGNOSIS — M8589 Other specified disorders of bone density and structure, multiple sites: Secondary | ICD-10-CM | POA: Diagnosis not present

## 2022-08-06 DIAGNOSIS — I69392 Facial weakness following cerebral infarction: Secondary | ICD-10-CM | POA: Diagnosis not present

## 2022-08-06 DIAGNOSIS — I959 Hypotension, unspecified: Secondary | ICD-10-CM | POA: Diagnosis not present

## 2022-08-06 DIAGNOSIS — G4089 Other seizures: Secondary | ICD-10-CM | POA: Diagnosis not present

## 2022-08-07 DIAGNOSIS — I959 Hypotension, unspecified: Secondary | ICD-10-CM | POA: Diagnosis not present

## 2022-08-07 DIAGNOSIS — Z7982 Long term (current) use of aspirin: Secondary | ICD-10-CM | POA: Diagnosis not present

## 2022-08-07 DIAGNOSIS — G4089 Other seizures: Secondary | ICD-10-CM | POA: Diagnosis not present

## 2022-08-07 DIAGNOSIS — N3281 Overactive bladder: Secondary | ICD-10-CM | POA: Diagnosis not present

## 2022-08-07 DIAGNOSIS — I69392 Facial weakness following cerebral infarction: Secondary | ICD-10-CM | POA: Diagnosis not present

## 2022-08-07 DIAGNOSIS — I10 Essential (primary) hypertension: Secondary | ICD-10-CM | POA: Diagnosis not present

## 2022-08-07 DIAGNOSIS — I69320 Aphasia following cerebral infarction: Secondary | ICD-10-CM | POA: Diagnosis not present

## 2022-08-07 DIAGNOSIS — M8589 Other specified disorders of bone density and structure, multiple sites: Secondary | ICD-10-CM | POA: Diagnosis not present

## 2022-08-07 DIAGNOSIS — R1310 Dysphagia, unspecified: Secondary | ICD-10-CM | POA: Diagnosis not present

## 2022-08-08 DIAGNOSIS — I10 Essential (primary) hypertension: Secondary | ICD-10-CM | POA: Diagnosis not present

## 2022-08-08 DIAGNOSIS — N3281 Overactive bladder: Secondary | ICD-10-CM | POA: Diagnosis not present

## 2022-08-08 DIAGNOSIS — G40909 Epilepsy, unspecified, not intractable, without status epilepticus: Secondary | ICD-10-CM | POA: Diagnosis not present

## 2022-08-08 DIAGNOSIS — R1310 Dysphagia, unspecified: Secondary | ICD-10-CM | POA: Diagnosis not present

## 2022-08-08 DIAGNOSIS — Z7982 Long term (current) use of aspirin: Secondary | ICD-10-CM | POA: Diagnosis not present

## 2022-08-08 DIAGNOSIS — M8589 Other specified disorders of bone density and structure, multiple sites: Secondary | ICD-10-CM | POA: Diagnosis not present

## 2022-08-08 DIAGNOSIS — R531 Weakness: Secondary | ICD-10-CM | POA: Diagnosis not present

## 2022-08-08 DIAGNOSIS — I69392 Facial weakness following cerebral infarction: Secondary | ICD-10-CM | POA: Diagnosis not present

## 2022-08-08 DIAGNOSIS — I959 Hypotension, unspecified: Secondary | ICD-10-CM | POA: Diagnosis not present

## 2022-08-08 DIAGNOSIS — I69320 Aphasia following cerebral infarction: Secondary | ICD-10-CM | POA: Diagnosis not present

## 2022-08-08 DIAGNOSIS — G4089 Other seizures: Secondary | ICD-10-CM | POA: Diagnosis not present

## 2022-08-08 DIAGNOSIS — F32A Depression, unspecified: Secondary | ICD-10-CM | POA: Diagnosis not present

## 2022-08-08 DIAGNOSIS — G4700 Insomnia, unspecified: Secondary | ICD-10-CM | POA: Diagnosis not present

## 2022-08-12 ENCOUNTER — Telehealth: Payer: Self-pay | Admitting: Family

## 2022-08-12 ENCOUNTER — Telehealth: Payer: Self-pay | Admitting: Neurology

## 2022-08-12 NOTE — Telephone Encounter (Signed)
Medication was started by PCP. Please have PCP review the message. Thanks. I do not object to increase dose

## 2022-08-12 NOTE — Telephone Encounter (Signed)
[facility])  called stating she made a visit 3.25.24 and pt was having constipation issues. She assisted with dis impacting her and noticed she was taking lactulose but it looked like an old Rx. Nurse would like to know if pt should continue taking this and if so how often and can an order for the medication be called into the pharmacy.

## 2022-08-12 NOTE — Telephone Encounter (Signed)
Terry Males, RN at [facility] states their Dr Ulyses Southward did a consult virtual visit on Thurs of last week and has suggested an increase on pt's Trazadone to 150mg (due to pt not sleeping and some depression due to loss of spouse) .  Terry Males, RN confirmed her vm is secure.

## 2022-08-12 NOTE — Telephone Encounter (Signed)
Terry Mcneil ([facility]) called stating pt had a missed PT visit.

## 2022-08-12 NOTE — Telephone Encounter (Signed)
noted 

## 2022-08-13 ENCOUNTER — Telehealth: Payer: Self-pay | Admitting: Family

## 2022-08-13 DIAGNOSIS — M8589 Other specified disorders of bone density and structure, multiple sites: Secondary | ICD-10-CM | POA: Diagnosis not present

## 2022-08-13 DIAGNOSIS — N3281 Overactive bladder: Secondary | ICD-10-CM | POA: Diagnosis not present

## 2022-08-13 DIAGNOSIS — I10 Essential (primary) hypertension: Secondary | ICD-10-CM | POA: Diagnosis not present

## 2022-08-13 DIAGNOSIS — I69320 Aphasia following cerebral infarction: Secondary | ICD-10-CM | POA: Diagnosis not present

## 2022-08-13 DIAGNOSIS — Z7982 Long term (current) use of aspirin: Secondary | ICD-10-CM | POA: Diagnosis not present

## 2022-08-13 DIAGNOSIS — G4089 Other seizures: Secondary | ICD-10-CM | POA: Diagnosis not present

## 2022-08-13 DIAGNOSIS — I959 Hypotension, unspecified: Secondary | ICD-10-CM | POA: Diagnosis not present

## 2022-08-13 DIAGNOSIS — I69392 Facial weakness following cerebral infarction: Secondary | ICD-10-CM | POA: Diagnosis not present

## 2022-08-13 DIAGNOSIS — R1310 Dysphagia, unspecified: Secondary | ICD-10-CM | POA: Diagnosis not present

## 2022-08-13 NOTE — Telephone Encounter (Signed)
Terry Mcneil from [place] wanted to report a fall. Stated she fell yesterday, was excited to see some family members and turned to grab the calendar leaving her Rolator behind so she lost her balance. He stated there were no broken bones or bruises and she did not hit her head.

## 2022-08-13 NOTE — Telephone Encounter (Signed)
Vicente Males, RN was called back and the message from [place], [place] was relayed to her.  She said she will reach out to PCP.

## 2022-08-13 NOTE — Telephone Encounter (Signed)
Terry Mcneil called back regarding a recommendation from the hospice provider to increase the Trazadone to 150 mg to help with the pt's sleep issues and depression. Please advise.   [facility] [phone]

## 2022-08-14 ENCOUNTER — Other Ambulatory Visit: Payer: Self-pay | Admitting: Family

## 2022-08-14 ENCOUNTER — Encounter: Payer: Self-pay | Admitting: Family

## 2022-08-14 DIAGNOSIS — Z7982 Long term (current) use of aspirin: Secondary | ICD-10-CM | POA: Diagnosis not present

## 2022-08-14 DIAGNOSIS — I959 Hypotension, unspecified: Secondary | ICD-10-CM | POA: Diagnosis not present

## 2022-08-14 DIAGNOSIS — N3281 Overactive bladder: Secondary | ICD-10-CM | POA: Diagnosis not present

## 2022-08-14 DIAGNOSIS — I69320 Aphasia following cerebral infarction: Secondary | ICD-10-CM | POA: Diagnosis not present

## 2022-08-14 DIAGNOSIS — G4089 Other seizures: Secondary | ICD-10-CM | POA: Diagnosis not present

## 2022-08-14 DIAGNOSIS — I69392 Facial weakness following cerebral infarction: Secondary | ICD-10-CM | POA: Diagnosis not present

## 2022-08-14 DIAGNOSIS — M8589 Other specified disorders of bone density and structure, multiple sites: Secondary | ICD-10-CM | POA: Diagnosis not present

## 2022-08-14 DIAGNOSIS — R1310 Dysphagia, unspecified: Secondary | ICD-10-CM | POA: Diagnosis not present

## 2022-08-14 DIAGNOSIS — I10 Essential (primary) hypertension: Secondary | ICD-10-CM | POA: Diagnosis not present

## 2022-08-14 MED ORDER — TRAZODONE HCL 50 MG PO TABS: 150.0000 mg | ORAL_TABLET | Freq: Every day | ORAL | 1 refills | Status: DC

## 2022-08-15 DIAGNOSIS — I10 Essential (primary) hypertension: Secondary | ICD-10-CM | POA: Diagnosis not present

## 2022-08-15 DIAGNOSIS — M8589 Other specified disorders of bone density and structure, multiple sites: Secondary | ICD-10-CM | POA: Diagnosis not present

## 2022-08-15 DIAGNOSIS — I69320 Aphasia following cerebral infarction: Secondary | ICD-10-CM | POA: Diagnosis not present

## 2022-08-15 DIAGNOSIS — R1310 Dysphagia, unspecified: Secondary | ICD-10-CM | POA: Diagnosis not present

## 2022-08-15 DIAGNOSIS — Z7982 Long term (current) use of aspirin: Secondary | ICD-10-CM | POA: Diagnosis not present

## 2022-08-15 DIAGNOSIS — G4089 Other seizures: Secondary | ICD-10-CM | POA: Diagnosis not present

## 2022-08-15 DIAGNOSIS — I69392 Facial weakness following cerebral infarction: Secondary | ICD-10-CM | POA: Diagnosis not present

## 2022-08-15 DIAGNOSIS — I959 Hypotension, unspecified: Secondary | ICD-10-CM | POA: Diagnosis not present

## 2022-08-15 DIAGNOSIS — N3281 Overactive bladder: Secondary | ICD-10-CM | POA: Diagnosis not present

## 2022-08-16 DIAGNOSIS — G4089 Other seizures: Secondary | ICD-10-CM | POA: Diagnosis not present

## 2022-08-16 DIAGNOSIS — I10 Essential (primary) hypertension: Secondary | ICD-10-CM | POA: Diagnosis not present

## 2022-08-16 DIAGNOSIS — R1310 Dysphagia, unspecified: Secondary | ICD-10-CM | POA: Diagnosis not present

## 2022-08-16 DIAGNOSIS — I69392 Facial weakness following cerebral infarction: Secondary | ICD-10-CM | POA: Diagnosis not present

## 2022-08-16 DIAGNOSIS — I69320 Aphasia following cerebral infarction: Secondary | ICD-10-CM | POA: Diagnosis not present

## 2022-08-16 DIAGNOSIS — I959 Hypotension, unspecified: Secondary | ICD-10-CM | POA: Diagnosis not present

## 2022-08-16 DIAGNOSIS — Z7982 Long term (current) use of aspirin: Secondary | ICD-10-CM | POA: Diagnosis not present

## 2022-08-16 DIAGNOSIS — M8589 Other specified disorders of bone density and structure, multiple sites: Secondary | ICD-10-CM | POA: Diagnosis not present

## 2022-08-16 DIAGNOSIS — N3281 Overactive bladder: Secondary | ICD-10-CM | POA: Diagnosis not present

## 2022-08-20 DIAGNOSIS — M8589 Other specified disorders of bone density and structure, multiple sites: Secondary | ICD-10-CM | POA: Diagnosis not present

## 2022-08-20 DIAGNOSIS — N3281 Overactive bladder: Secondary | ICD-10-CM | POA: Diagnosis not present

## 2022-08-20 DIAGNOSIS — G4089 Other seizures: Secondary | ICD-10-CM | POA: Diagnosis not present

## 2022-08-20 DIAGNOSIS — R1310 Dysphagia, unspecified: Secondary | ICD-10-CM | POA: Diagnosis not present

## 2022-08-20 DIAGNOSIS — I69320 Aphasia following cerebral infarction: Secondary | ICD-10-CM | POA: Diagnosis not present

## 2022-08-20 DIAGNOSIS — I10 Essential (primary) hypertension: Secondary | ICD-10-CM | POA: Diagnosis not present

## 2022-08-20 DIAGNOSIS — I959 Hypotension, unspecified: Secondary | ICD-10-CM | POA: Diagnosis not present

## 2022-08-20 DIAGNOSIS — Z7982 Long term (current) use of aspirin: Secondary | ICD-10-CM | POA: Diagnosis not present

## 2022-08-20 DIAGNOSIS — I69392 Facial weakness following cerebral infarction: Secondary | ICD-10-CM | POA: Diagnosis not present

## 2022-08-21 DIAGNOSIS — I69320 Aphasia following cerebral infarction: Secondary | ICD-10-CM | POA: Diagnosis not present

## 2022-08-21 DIAGNOSIS — G4089 Other seizures: Secondary | ICD-10-CM | POA: Diagnosis not present

## 2022-08-21 DIAGNOSIS — N3281 Overactive bladder: Secondary | ICD-10-CM | POA: Diagnosis not present

## 2022-08-21 DIAGNOSIS — I959 Hypotension, unspecified: Secondary | ICD-10-CM | POA: Diagnosis not present

## 2022-08-21 DIAGNOSIS — Z7982 Long term (current) use of aspirin: Secondary | ICD-10-CM | POA: Diagnosis not present

## 2022-08-21 DIAGNOSIS — I69392 Facial weakness following cerebral infarction: Secondary | ICD-10-CM | POA: Diagnosis not present

## 2022-08-21 DIAGNOSIS — R1310 Dysphagia, unspecified: Secondary | ICD-10-CM | POA: Diagnosis not present

## 2022-08-21 DIAGNOSIS — M8589 Other specified disorders of bone density and structure, multiple sites: Secondary | ICD-10-CM | POA: Diagnosis not present

## 2022-08-21 DIAGNOSIS — I10 Essential (primary) hypertension: Secondary | ICD-10-CM | POA: Diagnosis not present

## 2022-08-22 DIAGNOSIS — N3281 Overactive bladder: Secondary | ICD-10-CM | POA: Diagnosis not present

## 2022-08-22 DIAGNOSIS — I69392 Facial weakness following cerebral infarction: Secondary | ICD-10-CM | POA: Diagnosis not present

## 2022-08-22 DIAGNOSIS — I10 Essential (primary) hypertension: Secondary | ICD-10-CM | POA: Diagnosis not present

## 2022-08-22 DIAGNOSIS — I69320 Aphasia following cerebral infarction: Secondary | ICD-10-CM | POA: Diagnosis not present

## 2022-08-22 DIAGNOSIS — G4089 Other seizures: Secondary | ICD-10-CM | POA: Diagnosis not present

## 2022-08-22 DIAGNOSIS — M8589 Other specified disorders of bone density and structure, multiple sites: Secondary | ICD-10-CM | POA: Diagnosis not present

## 2022-08-22 DIAGNOSIS — R1310 Dysphagia, unspecified: Secondary | ICD-10-CM | POA: Diagnosis not present

## 2022-08-22 DIAGNOSIS — I959 Hypotension, unspecified: Secondary | ICD-10-CM | POA: Diagnosis not present

## 2022-08-22 DIAGNOSIS — Z7982 Long term (current) use of aspirin: Secondary | ICD-10-CM | POA: Diagnosis not present

## 2022-08-23 ENCOUNTER — Encounter: Payer: Self-pay | Admitting: Family

## 2022-08-23 MED ORDER — CEPHALEXIN 500 MG PO CAPS: 500.0000 mg | ORAL_CAPSULE | Freq: Two times a day (BID) | ORAL | 0 refills | Status: DC

## 2022-08-26 DIAGNOSIS — I69320 Aphasia following cerebral infarction: Secondary | ICD-10-CM | POA: Diagnosis not present

## 2022-08-26 DIAGNOSIS — Z7982 Long term (current) use of aspirin: Secondary | ICD-10-CM | POA: Diagnosis not present

## 2022-08-26 DIAGNOSIS — I10 Essential (primary) hypertension: Secondary | ICD-10-CM | POA: Diagnosis not present

## 2022-08-26 DIAGNOSIS — R1310 Dysphagia, unspecified: Secondary | ICD-10-CM | POA: Diagnosis not present

## 2022-08-26 DIAGNOSIS — M8589 Other specified disorders of bone density and structure, multiple sites: Secondary | ICD-10-CM | POA: Diagnosis not present

## 2022-08-26 DIAGNOSIS — G4089 Other seizures: Secondary | ICD-10-CM | POA: Diagnosis not present

## 2022-08-26 DIAGNOSIS — N3281 Overactive bladder: Secondary | ICD-10-CM | POA: Diagnosis not present

## 2022-08-26 DIAGNOSIS — I69392 Facial weakness following cerebral infarction: Secondary | ICD-10-CM | POA: Diagnosis not present

## 2022-08-26 DIAGNOSIS — I959 Hypotension, unspecified: Secondary | ICD-10-CM | POA: Diagnosis not present

## 2022-08-28 DIAGNOSIS — N3281 Overactive bladder: Secondary | ICD-10-CM | POA: Diagnosis not present

## 2022-08-28 DIAGNOSIS — M8589 Other specified disorders of bone density and structure, multiple sites: Secondary | ICD-10-CM | POA: Diagnosis not present

## 2022-08-28 DIAGNOSIS — I69320 Aphasia following cerebral infarction: Secondary | ICD-10-CM | POA: Diagnosis not present

## 2022-08-28 DIAGNOSIS — Z7982 Long term (current) use of aspirin: Secondary | ICD-10-CM | POA: Diagnosis not present

## 2022-08-28 DIAGNOSIS — I10 Essential (primary) hypertension: Secondary | ICD-10-CM | POA: Diagnosis not present

## 2022-08-28 DIAGNOSIS — G4089 Other seizures: Secondary | ICD-10-CM | POA: Diagnosis not present

## 2022-08-28 DIAGNOSIS — I69392 Facial weakness following cerebral infarction: Secondary | ICD-10-CM | POA: Diagnosis not present

## 2022-08-28 DIAGNOSIS — R1310 Dysphagia, unspecified: Secondary | ICD-10-CM | POA: Diagnosis not present

## 2022-08-28 DIAGNOSIS — I959 Hypotension, unspecified: Secondary | ICD-10-CM | POA: Diagnosis not present

## 2022-08-29 DIAGNOSIS — N3281 Overactive bladder: Secondary | ICD-10-CM | POA: Diagnosis not present

## 2022-08-29 DIAGNOSIS — I10 Essential (primary) hypertension: Secondary | ICD-10-CM | POA: Diagnosis not present

## 2022-08-29 DIAGNOSIS — R1310 Dysphagia, unspecified: Secondary | ICD-10-CM | POA: Diagnosis not present

## 2022-08-29 DIAGNOSIS — I959 Hypotension, unspecified: Secondary | ICD-10-CM | POA: Diagnosis not present

## 2022-08-29 DIAGNOSIS — M8589 Other specified disorders of bone density and structure, multiple sites: Secondary | ICD-10-CM | POA: Diagnosis not present

## 2022-08-29 DIAGNOSIS — I69320 Aphasia following cerebral infarction: Secondary | ICD-10-CM | POA: Diagnosis not present

## 2022-08-29 DIAGNOSIS — Z7982 Long term (current) use of aspirin: Secondary | ICD-10-CM | POA: Diagnosis not present

## 2022-08-29 DIAGNOSIS — G4089 Other seizures: Secondary | ICD-10-CM | POA: Diagnosis not present

## 2022-08-29 DIAGNOSIS — I69392 Facial weakness following cerebral infarction: Secondary | ICD-10-CM | POA: Diagnosis not present

## 2022-09-03 ENCOUNTER — Telehealth: Payer: Self-pay | Admitting: Family

## 2022-09-03 DIAGNOSIS — I69320 Aphasia following cerebral infarction: Secondary | ICD-10-CM | POA: Diagnosis not present

## 2022-09-03 DIAGNOSIS — I69392 Facial weakness following cerebral infarction: Secondary | ICD-10-CM | POA: Diagnosis not present

## 2022-09-03 DIAGNOSIS — Z7982 Long term (current) use of aspirin: Secondary | ICD-10-CM | POA: Diagnosis not present

## 2022-09-03 DIAGNOSIS — I959 Hypotension, unspecified: Secondary | ICD-10-CM | POA: Diagnosis not present

## 2022-09-03 DIAGNOSIS — N3281 Overactive bladder: Secondary | ICD-10-CM | POA: Diagnosis not present

## 2022-09-03 DIAGNOSIS — R1310 Dysphagia, unspecified: Secondary | ICD-10-CM | POA: Diagnosis not present

## 2022-09-03 DIAGNOSIS — M8589 Other specified disorders of bone density and structure, multiple sites: Secondary | ICD-10-CM | POA: Diagnosis not present

## 2022-09-03 DIAGNOSIS — I10 Essential (primary) hypertension: Secondary | ICD-10-CM | POA: Diagnosis not present

## 2022-09-03 DIAGNOSIS — G4089 Other seizures: Secondary | ICD-10-CM | POA: Diagnosis not present

## 2022-09-03 NOTE — Telephone Encounter (Signed)
Verbal orders given to HH

## 2022-09-03 NOTE — Telephone Encounter (Signed)
Caller/Agency: [facility] care [facility] [place] Callback Number: [phone] Requesting OT/PT/Skilled Nursing/Social Work/Speech Therapy: PT Frequency: 1 x 8

## 2022-09-04 DIAGNOSIS — R1310 Dysphagia, unspecified: Secondary | ICD-10-CM | POA: Diagnosis not present

## 2022-09-04 DIAGNOSIS — N3281 Overactive bladder: Secondary | ICD-10-CM | POA: Diagnosis not present

## 2022-09-04 DIAGNOSIS — I959 Hypotension, unspecified: Secondary | ICD-10-CM | POA: Diagnosis not present

## 2022-09-04 DIAGNOSIS — I69320 Aphasia following cerebral infarction: Secondary | ICD-10-CM | POA: Diagnosis not present

## 2022-09-04 DIAGNOSIS — G4089 Other seizures: Secondary | ICD-10-CM | POA: Diagnosis not present

## 2022-09-04 DIAGNOSIS — Z7982 Long term (current) use of aspirin: Secondary | ICD-10-CM | POA: Diagnosis not present

## 2022-09-04 DIAGNOSIS — I69392 Facial weakness following cerebral infarction: Secondary | ICD-10-CM | POA: Diagnosis not present

## 2022-09-04 DIAGNOSIS — I10 Essential (primary) hypertension: Secondary | ICD-10-CM | POA: Diagnosis not present

## 2022-09-04 DIAGNOSIS — M8589 Other specified disorders of bone density and structure, multiple sites: Secondary | ICD-10-CM | POA: Diagnosis not present

## 2022-09-05 DIAGNOSIS — M8589 Other specified disorders of bone density and structure, multiple sites: Secondary | ICD-10-CM | POA: Diagnosis not present

## 2022-09-05 DIAGNOSIS — I69392 Facial weakness following cerebral infarction: Secondary | ICD-10-CM | POA: Diagnosis not present

## 2022-09-05 DIAGNOSIS — I69320 Aphasia following cerebral infarction: Secondary | ICD-10-CM | POA: Diagnosis not present

## 2022-09-05 DIAGNOSIS — I959 Hypotension, unspecified: Secondary | ICD-10-CM | POA: Diagnosis not present

## 2022-09-05 DIAGNOSIS — G4089 Other seizures: Secondary | ICD-10-CM | POA: Diagnosis not present

## 2022-09-05 DIAGNOSIS — I10 Essential (primary) hypertension: Secondary | ICD-10-CM | POA: Diagnosis not present

## 2022-09-05 DIAGNOSIS — R1310 Dysphagia, unspecified: Secondary | ICD-10-CM | POA: Diagnosis not present

## 2022-09-05 DIAGNOSIS — Z7982 Long term (current) use of aspirin: Secondary | ICD-10-CM | POA: Diagnosis not present

## 2022-09-05 DIAGNOSIS — N3281 Overactive bladder: Secondary | ICD-10-CM | POA: Diagnosis not present

## 2022-09-07 DIAGNOSIS — I959 Hypotension, unspecified: Secondary | ICD-10-CM | POA: Diagnosis not present

## 2022-09-07 DIAGNOSIS — I10 Essential (primary) hypertension: Secondary | ICD-10-CM | POA: Diagnosis not present

## 2022-09-07 DIAGNOSIS — G4089 Other seizures: Secondary | ICD-10-CM | POA: Diagnosis not present

## 2022-09-07 DIAGNOSIS — M8589 Other specified disorders of bone density and structure, multiple sites: Secondary | ICD-10-CM | POA: Diagnosis not present

## 2022-09-07 DIAGNOSIS — I69392 Facial weakness following cerebral infarction: Secondary | ICD-10-CM | POA: Diagnosis not present

## 2022-09-07 DIAGNOSIS — Z7982 Long term (current) use of aspirin: Secondary | ICD-10-CM | POA: Diagnosis not present

## 2022-09-07 DIAGNOSIS — N3281 Overactive bladder: Secondary | ICD-10-CM | POA: Diagnosis not present

## 2022-09-07 DIAGNOSIS — R1310 Dysphagia, unspecified: Secondary | ICD-10-CM | POA: Diagnosis not present

## 2022-09-07 DIAGNOSIS — I69320 Aphasia following cerebral infarction: Secondary | ICD-10-CM | POA: Diagnosis not present

## 2022-09-08 DIAGNOSIS — M8589 Other specified disorders of bone density and structure, multiple sites: Secondary | ICD-10-CM | POA: Diagnosis not present

## 2022-09-08 DIAGNOSIS — I959 Hypotension, unspecified: Secondary | ICD-10-CM | POA: Diagnosis not present

## 2022-09-08 DIAGNOSIS — I69320 Aphasia following cerebral infarction: Secondary | ICD-10-CM | POA: Diagnosis not present

## 2022-09-08 DIAGNOSIS — I10 Essential (primary) hypertension: Secondary | ICD-10-CM | POA: Diagnosis not present

## 2022-09-08 DIAGNOSIS — Z9181 History of falling: Secondary | ICD-10-CM

## 2022-09-08 DIAGNOSIS — G4089 Other seizures: Secondary | ICD-10-CM | POA: Diagnosis not present

## 2022-09-08 DIAGNOSIS — Z7982 Long term (current) use of aspirin: Secondary | ICD-10-CM | POA: Diagnosis not present

## 2022-09-08 DIAGNOSIS — Z853 Personal history of malignant neoplasm of breast: Secondary | ICD-10-CM

## 2022-09-08 DIAGNOSIS — R1310 Dysphagia, unspecified: Secondary | ICD-10-CM | POA: Diagnosis not present

## 2022-09-08 DIAGNOSIS — I69392 Facial weakness following cerebral infarction: Secondary | ICD-10-CM | POA: Diagnosis not present

## 2022-09-08 DIAGNOSIS — N3281 Overactive bladder: Secondary | ICD-10-CM | POA: Diagnosis not present

## 2022-09-08 DIAGNOSIS — Z87891 Personal history of nicotine dependence: Secondary | ICD-10-CM

## 2022-09-09 DIAGNOSIS — I10 Essential (primary) hypertension: Secondary | ICD-10-CM | POA: Diagnosis not present

## 2022-09-09 DIAGNOSIS — G4089 Other seizures: Secondary | ICD-10-CM | POA: Diagnosis not present

## 2022-09-09 DIAGNOSIS — M8589 Other specified disorders of bone density and structure, multiple sites: Secondary | ICD-10-CM | POA: Diagnosis not present

## 2022-09-09 DIAGNOSIS — Z7982 Long term (current) use of aspirin: Secondary | ICD-10-CM | POA: Diagnosis not present

## 2022-09-09 DIAGNOSIS — N3281 Overactive bladder: Secondary | ICD-10-CM | POA: Diagnosis not present

## 2022-09-09 DIAGNOSIS — I69392 Facial weakness following cerebral infarction: Secondary | ICD-10-CM | POA: Diagnosis not present

## 2022-09-09 DIAGNOSIS — I69320 Aphasia following cerebral infarction: Secondary | ICD-10-CM | POA: Diagnosis not present

## 2022-09-09 DIAGNOSIS — R1310 Dysphagia, unspecified: Secondary | ICD-10-CM | POA: Diagnosis not present

## 2022-09-09 DIAGNOSIS — I959 Hypotension, unspecified: Secondary | ICD-10-CM | POA: Diagnosis not present

## 2022-09-12 ENCOUNTER — Encounter (INDEPENDENT_AMBULATORY_CARE_PROVIDER_SITE_OTHER): Payer: Medicare PPO | Admitting: Neurology

## 2022-09-12 DIAGNOSIS — G40909 Epilepsy, unspecified, not intractable, without status epilepticus: Secondary | ICD-10-CM

## 2022-09-13 ENCOUNTER — Encounter: Payer: Self-pay | Admitting: Neurology

## 2022-09-13 NOTE — Telephone Encounter (Signed)
Please see the MyChart message reply(ies) for my assessment and plan.  ?  ?This patient gave consent for this Medical Advice Message and is aware that it may result in a bill to their insurance company, as well as the possibility of receiving a bill for a co-payment or deductible. They are an established patient, but are not seeking medical advice exclusively about a problem treated during an in person or video visit in the last seven days. I did not recommend an in person or video visit within seven days of my reply.  ?  ?I spent a total of 6 minutes cumulative time within 7 days through MyChart messaging. ? ?Bhavana Kady, MD   ?

## 2022-09-19 DIAGNOSIS — I10 Essential (primary) hypertension: Secondary | ICD-10-CM | POA: Diagnosis not present

## 2022-09-19 DIAGNOSIS — I959 Hypotension, unspecified: Secondary | ICD-10-CM | POA: Diagnosis not present

## 2022-09-19 DIAGNOSIS — G4089 Other seizures: Secondary | ICD-10-CM | POA: Diagnosis not present

## 2022-09-19 DIAGNOSIS — M8589 Other specified disorders of bone density and structure, multiple sites: Secondary | ICD-10-CM | POA: Diagnosis not present

## 2022-09-19 DIAGNOSIS — N3281 Overactive bladder: Secondary | ICD-10-CM | POA: Diagnosis not present

## 2022-09-19 DIAGNOSIS — I69320 Aphasia following cerebral infarction: Secondary | ICD-10-CM | POA: Diagnosis not present

## 2022-09-19 DIAGNOSIS — I69392 Facial weakness following cerebral infarction: Secondary | ICD-10-CM | POA: Diagnosis not present

## 2022-09-19 DIAGNOSIS — Z7982 Long term (current) use of aspirin: Secondary | ICD-10-CM | POA: Diagnosis not present

## 2022-09-19 DIAGNOSIS — R1310 Dysphagia, unspecified: Secondary | ICD-10-CM | POA: Diagnosis not present

## 2022-09-24 DIAGNOSIS — Z7982 Long term (current) use of aspirin: Secondary | ICD-10-CM | POA: Diagnosis not present

## 2022-09-24 DIAGNOSIS — I959 Hypotension, unspecified: Secondary | ICD-10-CM | POA: Diagnosis not present

## 2022-09-24 DIAGNOSIS — I10 Essential (primary) hypertension: Secondary | ICD-10-CM | POA: Diagnosis not present

## 2022-09-24 DIAGNOSIS — M8589 Other specified disorders of bone density and structure, multiple sites: Secondary | ICD-10-CM | POA: Diagnosis not present

## 2022-09-24 DIAGNOSIS — I69320 Aphasia following cerebral infarction: Secondary | ICD-10-CM | POA: Diagnosis not present

## 2022-09-24 DIAGNOSIS — N3281 Overactive bladder: Secondary | ICD-10-CM | POA: Diagnosis not present

## 2022-09-24 DIAGNOSIS — G4089 Other seizures: Secondary | ICD-10-CM | POA: Diagnosis not present

## 2022-09-24 DIAGNOSIS — I69392 Facial weakness following cerebral infarction: Secondary | ICD-10-CM | POA: Diagnosis not present

## 2022-09-24 DIAGNOSIS — R1310 Dysphagia, unspecified: Secondary | ICD-10-CM | POA: Diagnosis not present

## 2022-10-01 ENCOUNTER — Other Ambulatory Visit: Payer: Self-pay | Admitting: Neurology

## 2022-10-01 DIAGNOSIS — N3281 Overactive bladder: Secondary | ICD-10-CM | POA: Diagnosis not present

## 2022-10-01 DIAGNOSIS — R1310 Dysphagia, unspecified: Secondary | ICD-10-CM | POA: Diagnosis not present

## 2022-10-01 DIAGNOSIS — I10 Essential (primary) hypertension: Secondary | ICD-10-CM | POA: Diagnosis not present

## 2022-10-01 DIAGNOSIS — Z7982 Long term (current) use of aspirin: Secondary | ICD-10-CM | POA: Diagnosis not present

## 2022-10-01 DIAGNOSIS — I69392 Facial weakness following cerebral infarction: Secondary | ICD-10-CM | POA: Diagnosis not present

## 2022-10-01 DIAGNOSIS — I959 Hypotension, unspecified: Secondary | ICD-10-CM | POA: Diagnosis not present

## 2022-10-01 DIAGNOSIS — G4089 Other seizures: Secondary | ICD-10-CM | POA: Diagnosis not present

## 2022-10-01 DIAGNOSIS — I69320 Aphasia following cerebral infarction: Secondary | ICD-10-CM | POA: Diagnosis not present

## 2022-10-01 DIAGNOSIS — M8589 Other specified disorders of bone density and structure, multiple sites: Secondary | ICD-10-CM | POA: Diagnosis not present

## 2022-10-01 MED ORDER — LACOSAMIDE 100 MG PO TABS: 100.0000 mg | ORAL_TABLET | Freq: Two times a day (BID) | ORAL | 2 refills | Status: DC

## 2022-10-01 NOTE — Telephone Encounter (Signed)
Pt is needing a refill on her Lacosamide 100 MG TABS as soon as possible. Pt will run out tomorrow and for some reason the pharmacy sent the refill request to the hospital instead of provider here. Pt's daughter is needing the Rx refill to the [facility] in [place].

## 2022-10-01 NOTE — Telephone Encounter (Signed)
Requested Prescriptions   Pending Prescriptions Disp Refills   Lacosamide 100 MG TABS 60 tablet 0    Sig: Take 1 tablet (100 mg total) by mouth 2 (two) times daily.   Last seen 07/17/22, next appt has not been scheduled. And she has been getting lacosamide from another provider routing to Dr. Teresa Coombs to determine if he would like to send refill   Dispenses   Dispensed Days Supply Quantity Provider Pharmacy  LACOSAMIDE 100MG     TAB 08/29/2022 30 60 each Pola Corn, MD [facility] [phone] ...  LACOSAMIDE 100MG     TAB 07/29/2022 30 60 each Pola Corn, MD [facility] [phone] ...  lacosamide 100 mg tablet 06/27/2022 30 60 tablet Pola Corn, MD [facility]...       How do dispenses affect the score?

## 2022-10-02 DIAGNOSIS — R569 Unspecified convulsions: Secondary | ICD-10-CM | POA: Diagnosis not present

## 2022-10-02 DIAGNOSIS — R55 Syncope and collapse: Secondary | ICD-10-CM | POA: Diagnosis not present

## 2022-10-02 DIAGNOSIS — R000 Tachycardia, unspecified: Secondary | ICD-10-CM | POA: Diagnosis not present

## 2022-10-02 DIAGNOSIS — E860 Dehydration: Secondary | ICD-10-CM | POA: Diagnosis not present

## 2022-10-02 DIAGNOSIS — R079 Chest pain, unspecified: Secondary | ICD-10-CM | POA: Diagnosis not present

## 2022-10-03 DIAGNOSIS — R55 Syncope and collapse: Secondary | ICD-10-CM | POA: Diagnosis not present

## 2022-10-07 DIAGNOSIS — N3281 Overactive bladder: Secondary | ICD-10-CM | POA: Diagnosis not present

## 2022-10-07 DIAGNOSIS — M8589 Other specified disorders of bone density and structure, multiple sites: Secondary | ICD-10-CM | POA: Diagnosis not present

## 2022-10-07 DIAGNOSIS — I959 Hypotension, unspecified: Secondary | ICD-10-CM | POA: Diagnosis not present

## 2022-10-07 DIAGNOSIS — Z7982 Long term (current) use of aspirin: Secondary | ICD-10-CM | POA: Diagnosis not present

## 2022-10-07 DIAGNOSIS — I10 Essential (primary) hypertension: Secondary | ICD-10-CM | POA: Diagnosis not present

## 2022-10-07 DIAGNOSIS — G4089 Other seizures: Secondary | ICD-10-CM | POA: Diagnosis not present

## 2022-10-07 DIAGNOSIS — I69320 Aphasia following cerebral infarction: Secondary | ICD-10-CM | POA: Diagnosis not present

## 2022-10-07 DIAGNOSIS — R1310 Dysphagia, unspecified: Secondary | ICD-10-CM | POA: Diagnosis not present

## 2022-10-07 DIAGNOSIS — I69392 Facial weakness following cerebral infarction: Secondary | ICD-10-CM | POA: Diagnosis not present

## 2022-10-09 DIAGNOSIS — G4089 Other seizures: Secondary | ICD-10-CM | POA: Diagnosis not present

## 2022-10-09 DIAGNOSIS — Z7982 Long term (current) use of aspirin: Secondary | ICD-10-CM | POA: Diagnosis not present

## 2022-10-09 DIAGNOSIS — R1310 Dysphagia, unspecified: Secondary | ICD-10-CM | POA: Diagnosis not present

## 2022-10-09 DIAGNOSIS — N3281 Overactive bladder: Secondary | ICD-10-CM | POA: Diagnosis not present

## 2022-10-09 DIAGNOSIS — I69392 Facial weakness following cerebral infarction: Secondary | ICD-10-CM | POA: Diagnosis not present

## 2022-10-09 DIAGNOSIS — I10 Essential (primary) hypertension: Secondary | ICD-10-CM | POA: Diagnosis not present

## 2022-10-09 DIAGNOSIS — I959 Hypotension, unspecified: Secondary | ICD-10-CM | POA: Diagnosis not present

## 2022-10-09 DIAGNOSIS — M8589 Other specified disorders of bone density and structure, multiple sites: Secondary | ICD-10-CM | POA: Diagnosis not present

## 2022-10-09 DIAGNOSIS — I69320 Aphasia following cerebral infarction: Secondary | ICD-10-CM | POA: Diagnosis not present

## 2022-10-11 ENCOUNTER — Other Ambulatory Visit: Payer: Self-pay | Admitting: Neurology

## 2022-10-11 ENCOUNTER — Other Ambulatory Visit: Payer: Self-pay | Admitting: Family

## 2022-10-11 ENCOUNTER — Encounter: Payer: Self-pay | Admitting: Family

## 2022-10-11 MED ORDER — LEVETIRACETAM 250 MG PO TABS: 250.0000 mg | ORAL_TABLET | Freq: Two times a day (BID) | ORAL | 2 refills | Status: DC

## 2022-10-11 NOTE — Telephone Encounter (Signed)
Dr. Cathlean Cower, Please see pt mychart message. Are you comfortable continuing her Keppra refills please? I think the hospitalist was the last to write the Rx.  Tks Welden Hausmann

## 2022-10-11 NOTE — Telephone Encounter (Signed)
Of course, will send refills.   Thanks  Dr. Teresa Coombs

## 2022-10-14 DIAGNOSIS — I69392 Facial weakness following cerebral infarction: Secondary | ICD-10-CM | POA: Diagnosis not present

## 2022-10-14 DIAGNOSIS — I959 Hypotension, unspecified: Secondary | ICD-10-CM | POA: Diagnosis not present

## 2022-10-14 DIAGNOSIS — M8589 Other specified disorders of bone density and structure, multiple sites: Secondary | ICD-10-CM | POA: Diagnosis not present

## 2022-10-14 DIAGNOSIS — Z7982 Long term (current) use of aspirin: Secondary | ICD-10-CM | POA: Diagnosis not present

## 2022-10-14 DIAGNOSIS — G4089 Other seizures: Secondary | ICD-10-CM | POA: Diagnosis not present

## 2022-10-14 DIAGNOSIS — I69320 Aphasia following cerebral infarction: Secondary | ICD-10-CM | POA: Diagnosis not present

## 2022-10-14 DIAGNOSIS — R1310 Dysphagia, unspecified: Secondary | ICD-10-CM | POA: Diagnosis not present

## 2022-10-14 DIAGNOSIS — N3281 Overactive bladder: Secondary | ICD-10-CM | POA: Diagnosis not present

## 2022-10-14 DIAGNOSIS — I10 Essential (primary) hypertension: Secondary | ICD-10-CM | POA: Diagnosis not present

## 2022-10-22 ENCOUNTER — Other Ambulatory Visit (HOSPITAL_BASED_OUTPATIENT_CLINIC_OR_DEPARTMENT_OTHER): Payer: Self-pay | Admitting: Family

## 2022-10-22 DIAGNOSIS — Z7982 Long term (current) use of aspirin: Secondary | ICD-10-CM | POA: Diagnosis not present

## 2022-10-22 DIAGNOSIS — I959 Hypotension, unspecified: Secondary | ICD-10-CM | POA: Diagnosis not present

## 2022-10-22 DIAGNOSIS — I69392 Facial weakness following cerebral infarction: Secondary | ICD-10-CM | POA: Diagnosis not present

## 2022-10-22 DIAGNOSIS — R1310 Dysphagia, unspecified: Secondary | ICD-10-CM | POA: Diagnosis not present

## 2022-10-22 DIAGNOSIS — I10 Essential (primary) hypertension: Secondary | ICD-10-CM | POA: Diagnosis not present

## 2022-10-22 DIAGNOSIS — N3281 Overactive bladder: Secondary | ICD-10-CM | POA: Diagnosis not present

## 2022-10-22 DIAGNOSIS — M8589 Other specified disorders of bone density and structure, multiple sites: Secondary | ICD-10-CM | POA: Diagnosis not present

## 2022-10-22 DIAGNOSIS — G4089 Other seizures: Secondary | ICD-10-CM | POA: Diagnosis not present

## 2022-10-22 DIAGNOSIS — I69320 Aphasia following cerebral infarction: Secondary | ICD-10-CM | POA: Diagnosis not present

## 2022-10-22 DIAGNOSIS — Z1231 Encounter for screening mammogram for malignant neoplasm of breast: Secondary | ICD-10-CM

## 2022-10-24 ENCOUNTER — Ambulatory Visit (HOSPITAL_BASED_OUTPATIENT_CLINIC_OR_DEPARTMENT_OTHER)
Admission: RE | Admit: 2022-10-24 | Discharge: 2022-10-24 | Disposition: A | Discharge status: Home / Self Care | Payer: Medicare PPO | Source: Ambulatory Visit | Attending: Family | Admitting: Family

## 2022-10-24 ENCOUNTER — Encounter (HOSPITAL_BASED_OUTPATIENT_CLINIC_OR_DEPARTMENT_OTHER): Payer: Self-pay

## 2022-10-24 DIAGNOSIS — Z1231 Encounter for screening mammogram for malignant neoplasm of breast: Secondary | ICD-10-CM | POA: Insufficient documentation

## 2022-10-28 ENCOUNTER — Telehealth: Payer: Self-pay | Admitting: Family

## 2022-10-28 DIAGNOSIS — I69320 Aphasia following cerebral infarction: Secondary | ICD-10-CM | POA: Diagnosis not present

## 2022-10-28 DIAGNOSIS — G4089 Other seizures: Secondary | ICD-10-CM | POA: Diagnosis not present

## 2022-10-28 DIAGNOSIS — I10 Essential (primary) hypertension: Secondary | ICD-10-CM | POA: Diagnosis not present

## 2022-10-28 DIAGNOSIS — I959 Hypotension, unspecified: Secondary | ICD-10-CM | POA: Diagnosis not present

## 2022-10-28 DIAGNOSIS — Z7982 Long term (current) use of aspirin: Secondary | ICD-10-CM | POA: Diagnosis not present

## 2022-10-28 DIAGNOSIS — I69392 Facial weakness following cerebral infarction: Secondary | ICD-10-CM | POA: Diagnosis not present

## 2022-10-28 DIAGNOSIS — N3281 Overactive bladder: Secondary | ICD-10-CM | POA: Diagnosis not present

## 2022-10-28 DIAGNOSIS — R1310 Dysphagia, unspecified: Secondary | ICD-10-CM | POA: Diagnosis not present

## 2022-10-28 DIAGNOSIS — M8589 Other specified disorders of bone density and structure, multiple sites: Secondary | ICD-10-CM | POA: Diagnosis not present

## 2022-10-28 NOTE — Telephone Encounter (Signed)
PT called just wanting to send an FYI to provider they will be discharging PT services today 6.17.24

## 2022-10-29 ENCOUNTER — Other Ambulatory Visit: Payer: Self-pay | Admitting: Family

## 2022-11-08 ENCOUNTER — Encounter: Payer: Self-pay | Admitting: Family

## 2022-11-08 MED ORDER — TOLTERODINE TARTRATE ER 4 MG PO CP24: 4.0000 mg | ORAL_CAPSULE | Freq: Every day | ORAL | 1 refills | Status: DC

## 2022-11-10 ENCOUNTER — Other Ambulatory Visit: Payer: Self-pay | Admitting: Family

## 2022-11-11 ENCOUNTER — Encounter (INDEPENDENT_AMBULATORY_CARE_PROVIDER_SITE_OTHER): Payer: Medicare PPO | Admitting: Family

## 2022-11-11 DIAGNOSIS — N390 Urinary tract infection, site not specified: Secondary | ICD-10-CM | POA: Diagnosis not present

## 2022-11-11 MED ORDER — CEPHALEXIN 500 MG PO CAPS: 500.0000 mg | ORAL_CAPSULE | Freq: Two times a day (BID) | ORAL | 0 refills | Status: DC

## 2022-11-11 NOTE — Telephone Encounter (Signed)
Please see the MyChart message reply(ies) for my assessment and plan.  The patient gave consent for this Medical Advice Message and is aware that it may result in a bill to their insurance company as well as the possibility that this may result in a co-payment or deductible. They are an established patient, but are not seeking medical advice exclusively about a problem treated during an in person or video visit in the last 7 days. I did not recommend an in person or video visit within 7 days of my reply.  I spent a total of 5 minutes cumulative provider time within 7 days through MyChart messaging.  Boysie Bonebrake S O'Sullivan, NP  

## 2022-11-15 ENCOUNTER — Other Ambulatory Visit: Payer: Self-pay | Admitting: Family

## 2022-11-22 ENCOUNTER — Encounter: Payer: Self-pay | Admitting: Neurology

## 2022-11-25 ENCOUNTER — Other Ambulatory Visit: Payer: Self-pay

## 2022-11-25 ENCOUNTER — Telehealth: Payer: Self-pay | Admitting: Family

## 2022-11-25 MED ORDER — TRAZODONE HCL 50 MG PO TABS: 150.0000 mg | ORAL_TABLET | Freq: Every day | ORAL | 0 refills | Status: DC

## 2022-11-25 NOTE — Telephone Encounter (Signed)
Patient had 90 days refill pending on her las rx. This was sent to [facility] as requested

## 2022-11-25 NOTE — Telephone Encounter (Signed)
Pt's daughter called to receive a refill for Trazodone. The [facility] was [facility] [address] [place] [facility] [address]. She would like [facility] to be removed.

## 2022-12-02 ENCOUNTER — Encounter: Payer: Self-pay | Admitting: Neurology

## 2022-12-02 ENCOUNTER — Other Ambulatory Visit: Payer: Self-pay

## 2022-12-02 NOTE — Telephone Encounter (Signed)
Called pt but VM was full. If pt calls back an appt has been put on hold for her on 9/26 @2 :45pm.

## 2022-12-02 NOTE — Telephone Encounter (Signed)
Pt's daughter in law returned call. Pt has been scheduled.

## 2022-12-03 ENCOUNTER — Other Ambulatory Visit: Payer: Self-pay

## 2022-12-03 NOTE — Addendum Note (Signed)
Addended by: Deatra James on: 12/03/2022 08:58 AM   Modules accepted: Orders

## 2022-12-10 MED ORDER — LACOSAMIDE 100 MG PO TABS: 100.0000 mg | ORAL_TABLET | Freq: Two times a day (BID) | ORAL | 2 refills | Status: DC

## 2022-12-18 ENCOUNTER — Other Ambulatory Visit: Payer: Self-pay

## 2022-12-18 MED ORDER — LACOSAMIDE 100 MG PO TABS: 100.0000 mg | ORAL_TABLET | Freq: Two times a day (BID) | ORAL | 2 refills | Status: DC

## 2022-12-18 NOTE — Addendum Note (Signed)
Addended by: Danne Harbor on: 12/18/2022 08:35 AM   Modules accepted: Orders

## 2022-12-26 ENCOUNTER — Encounter (INDEPENDENT_AMBULATORY_CARE_PROVIDER_SITE_OTHER): Payer: Self-pay

## 2023-02-06 ENCOUNTER — Encounter: Payer: Self-pay | Admitting: Neurology

## 2023-02-06 ENCOUNTER — Ambulatory Visit: Payer: Medicare PPO | Admitting: Neurology

## 2023-02-06 VITALS — BP 159/102 | HR 85 | Ht 64.0 in | Wt 130.0 lb

## 2023-02-06 DIAGNOSIS — Z5181 Encounter for therapeutic drug level monitoring: Secondary | ICD-10-CM | POA: Diagnosis not present

## 2023-02-06 DIAGNOSIS — S069X9D Unspecified intracranial injury with loss of consciousness of unspecified duration, subsequent encounter: Secondary | ICD-10-CM | POA: Diagnosis not present

## 2023-02-06 DIAGNOSIS — R269 Unspecified abnormalities of gait and mobility: Secondary | ICD-10-CM | POA: Diagnosis not present

## 2023-02-06 DIAGNOSIS — G40909 Epilepsy, unspecified, not intractable, without status epilepticus: Secondary | ICD-10-CM | POA: Diagnosis not present

## 2023-02-06 DIAGNOSIS — R5381 Other malaise: Secondary | ICD-10-CM

## 2023-02-06 DIAGNOSIS — R413 Other amnesia: Secondary | ICD-10-CM

## 2023-02-06 DIAGNOSIS — S065XAA Traumatic subdural hemorrhage with loss of consciousness status unknown, initial encounter: Secondary | ICD-10-CM

## 2023-02-06 DIAGNOSIS — R531 Weakness: Secondary | ICD-10-CM

## 2023-02-06 MED ORDER — LEVETIRACETAM 250 MG PO TABS: 250.0000 mg | ORAL_TABLET | Freq: Two times a day (BID) | ORAL | 3 refills | Status: DC

## 2023-02-06 MED ORDER — LACOSAMIDE 100 MG PO TABS: 100.0000 mg | ORAL_TABLET | Freq: Two times a day (BID) | ORAL | 3 refills | Status: DC

## 2023-02-06 MED ORDER — CITALOPRAM HYDROBROMIDE 20 MG PO TABS
20.0000 mg | ORAL_TABLET | Freq: Every day | ORAL | 3 refills | Status: DC
Start: 2023-02-06 — End: 2023-06-02
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
## 2023-02-06 NOTE — Patient Instructions (Addendum)
Continue current medications including Keppra 250 mg twice daily and lacosamide 100 mg twice daily Will check Keppra level and lacosamide level Will check ATN profile to look for Alzheimer disease biomarker Will also start the patient on Celexa for her depressed mood and anxiety Referral to home PT for deconditioning and gait abnormality. Contact me for any new concerns Follow-up in 6 months or sooner if worse.

## 2023-02-07 ENCOUNTER — Ambulatory Visit (INDEPENDENT_AMBULATORY_CARE_PROVIDER_SITE_OTHER): Payer: Medicare PPO | Admitting: Family

## 2023-02-07 ENCOUNTER — Encounter: Payer: Self-pay | Admitting: Family

## 2023-02-07 VITALS — BP 94/61 | HR 97 | Temp 97.5°F | Resp 16

## 2023-02-07 DIAGNOSIS — C50412 Malignant neoplasm of upper-outer quadrant of left female breast: Secondary | ICD-10-CM | POA: Diagnosis not present

## 2023-02-07 DIAGNOSIS — N3281 Overactive bladder: Secondary | ICD-10-CM | POA: Diagnosis not present

## 2023-02-07 DIAGNOSIS — I10 Essential (primary) hypertension: Secondary | ICD-10-CM | POA: Diagnosis not present

## 2023-02-07 DIAGNOSIS — M272 Inflammatory conditions of jaws: Secondary | ICD-10-CM | POA: Diagnosis not present

## 2023-02-07 DIAGNOSIS — E876 Hypokalemia: Secondary | ICD-10-CM | POA: Diagnosis not present

## 2023-02-07 DIAGNOSIS — Z170 Estrogen receptor positive status [ER+]: Secondary | ICD-10-CM

## 2023-02-07 DIAGNOSIS — N390 Urinary tract infection, site not specified: Secondary | ICD-10-CM | POA: Diagnosis not present

## 2023-02-07 DIAGNOSIS — F32A Depression, unspecified: Secondary | ICD-10-CM

## 2023-02-07 LAB — BASIC METABOLIC PANEL
BUN: 16 mg/dL (ref 6–23)
CO2: 28 meq/L (ref 19–32)
Calcium: 9.8 mg/dL (ref 8.4–10.5)
Chloride: 102 meq/L (ref 96–112)
Creatinine, Ser: 0.69 mg/dL (ref 0.40–1.20)
GFR: 80.41 mL/min (ref >60.00)
Glucose, Bld: 137 mg/dL — ABNORMAL HIGH (ref 70–99)
Potassium: 3.6 meq/L (ref 3.5–5.1)
Sodium: 142 meq/L (ref 135–145)

## 2023-02-07 MED ORDER — MIDODRINE HCL 2.5 MG PO TABS: 2.5000 mg | ORAL_TABLET | Freq: Three times a day (TID) | ORAL | 1 refills | Status: DC

## 2023-02-07 MED ORDER — DOXYCYCLINE HYCLATE 100 MG PO TABS
100.0000 mg | ORAL_TABLET | Freq: Two times a day (BID) | ORAL | 0 refills | Status: DC
Start: 2023-02-07 — End: 2023-03-11

## 2023-02-07 MED ORDER — TOLTERODINE TARTRATE ER 4 MG PO CP24: 4.0000 mg | ORAL_CAPSULE | Freq: Every day | ORAL | 1 refills | Status: DC

## 2023-02-07 NOTE — Progress Notes (Signed)
Subjective:     Patient ID: Terry Mcneil, female    DOB: 1940/06/25, 82 y.o.   MRN: [id number]  Chief Complaint  Patient presents with   Annual Exam    HPI  Discussed the use of AI scribe software for clinical note transcription with the patient, who gave verbal consent to proceed.  History of Present Illness    The patient, an elderly woman with a history of breast cancer, dementia, and other chronic conditions, presents with her daughter-in-law. The patient and caregiver's main concern is her general weakness, which has been worsening. [name] unable to stand for more than a few seconds without shaking and often needs assistance with mobility. The patient's daughter-in-law reports that the patient spends most of her day sitting and is not very active. She has 24 hour in home caregivers.  The patient also has a possible infection in her jaw, which has been causing discomfort and has changed the structure of her face. The patient has had similar issues in the past, with bone growth in her mouth that has led to abscesses. The patient's daughter-in-law reports that they have been managing the condition with salt water rinses and hot compresses, but the patient has not been on antibiotics. Apparently she also sees an Transport planner who has been notified.   The patient's memory has been worsening, which is a concern given her diagnosis of dementia. The patient's daughter-in-law reports that the patient's memory seems to decline as the day progresses.  Caregiver also notes concerns about depression. The patient has recently been prescribed Celexa by her neurologist, but has not started taking it yet.   The patient is currently on several medications, including Detrol for bladder control, Keppra for seizures, and midodrine for low blood pressure. The patient's daughter-in-law is interested in switching some of the patient's prescriptions to 90-day supplies for convenience.      Of note- pt's  recorded weight of 130 is not from today- daugher in law declined pt weight due to her being in a wheelchair and having trouble standing.    Health Maintenance Due  Topic Date Due   Zoster Vaccines- Shingrix (1 of 2) 12/16/1959   COVID-19 Vaccine (3 - Pfizer risk series) 07/20/2019   DTaP/Tdap/Td (3 - Tdap) 09/09/2020   Medicare Annual Wellness (AWV)  12/05/2020    Past Medical History:  Diagnosis Date   Arthritis    back   Cancer (HCC) 09/2016   left breast cancer   Cataract    Colon polyps    Hypertension    Osteopenia 10/11/2009   Patellar fracture    Right Knee    Urge incontinence     Past Surgical History:  Procedure Laterality Date   BREAST LUMPECTOMY Left 09/2016   COLONOSCOPY     EYE SURGERY Bilateral    Cataracts    HIP CLOSED REDUCTION Left 02/13/2019   Procedure: CLOSED MANIPULATION OF DISLOCATED HIP;  Surgeon: Nadara Mustard, MD;  Location: [facility] OR;  Service: Orthopedics;  Laterality: Left;   ORIF PATELLA Right 08/28/2012   Procedure: OPEN REDUCTION INTERNAL (ORIF) FIXATION PATELLA- RIGHT;  Surgeon: Nadara Mustard, MD;  Location: [facility] OR;  Service: Orthopedics;  Laterality: Right;  Open Reduction Internal Fixation Right [place]   RADIOACTIVE SEED GUIDED PARTIAL MASTECTOMY WITH AXILLARY SENTINEL LYMPH NODE BIOPSY Left 10/03/2016   Procedure: LEFT BREAST RADIOACTIVE SEED GUIDED PARTIAL MASTECTOMY WITH LEFT SENTINEL LYMPH NODE MAPPING;  Surgeon: Harriette Bouillon, MD;  Location: [facility];  Service: General;  Laterality: Left;   TONSILLECTOMY     TOTAL HIP ARTHROPLASTY Left 01/13/2018   Procedure: LEFT TOTAL HIP ARTHROPLASTY ANTERIOR APPROACH;  Surgeon: Kathryne Hitch, MD;  Location: [facility] OR;  Service: Orthopedics;  Laterality: Left;   TUBAL LIGATION     VAGINAL DELIVERY     x2 -     Family History  Problem Relation Age of Onset   Heart disease Father 71       valve replacement    Social History   Socioeconomic History   Marital status:  Widowed    Spouse name: Nicosha Bridwell   Number of children: 2   Years of education: Not on file   Highest education level: Not on file  Occupational History   Occupation: Retired  Tobacco Use   Smoking status: Former    Current packs/day: 0.00    Types: Cigarettes    Quit date: 08/27/1978    Years since quitting: 44.4   Smokeless tobacco: Never  Vaping Use   Vaping status: Never Used  Substance and Sexual Activity   Alcohol use: Yes    Alcohol/week: 7.0 standard drinks of alcohol    Types: 7 Glasses of wine per week   Drug use: No   Sexual activity: Yes  Other Topics Concern   Not on file  Social History Narrative   Marital Status:  Married ([name]) Husband has been diagnosed with Alzheimers.  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     Alcohol Use:  Moderate (Wine), 2 glasses 3 times a week   Drug Use:  None   Diet:  Weight Watchers    Exercise:  Not regular   Hobbies: Reading , Gardening , Grandchildren            Social Determinants of Health   Financial Resource Strain: Low Risk  (12/06/2019)   Overall Financial Resource Strain (CARDIA)    Difficulty of Paying Living Expenses: Not hard at all  Food Insecurity: No Food Insecurity (12/06/2019)   Hunger Vital Sign    Worried About Running Out of Food in the Last Year: Never true    Ran Out of Food in the Last Year: Never true  Transportation Needs: No Transportation Needs (12/06/2019)   PRAPARE - Administrator, Civil Service (Medical): No    Lack of Transportation (Non-Medical): No  Physical Activity: Not on file  Stress: Not on file  Social Connections: Not on file  Intimate Partner Violence: Not on file    Outpatient Medications Prior to Visit   Medication Sig Dispense Refill   aspirin EC 81 MG tablet Take 81 mg by mouth daily. Swallow whole.     citalopram (CELEXA) 20 MG tablet Take 1 tablet (20 mg total) by mouth daily. 30 tablet 3   Lacosamide 100 MG TABS Take 1 tablet (100 mg total) by mouth 2 (two) times daily. 180 tablet 3   levETIRAcetam (KEPPRA) 250 MG tablet Take 1 tablet (250 mg total) by mouth 2 (two) times daily. 180 tablet 3   Melatonin 10 MG TABS Take 30 mg by mouth at bedtime.     Multiple Vitamin (MULTIVITAMIN) tablet Take 1 tablet by mouth  daily.     omega-3 acid ethyl esters (LOVAZA) 1 g capsule Take by mouth 2 (two) times daily.     potassium chloride (KLOR-CON M) 10 MEQ tablet Take 1 tablet by mouth daily.     midodrine (PROAMATINE) 2.5 MG tablet TAKE 1 TABLET BY MOUTH THREE TIMES DAILY WITH MEALS 90 tablet 2   tolterodine (DETROL LA) 4 MG 24 hr capsule Take 1 capsule (4 mg total) by mouth daily. 90 capsule 1   traZODone (DESYREL) 50 MG tablet Take 3 tablets (150 mg total) by mouth at bedtime. 90 tablet 0   No facility-administered medications prior to visit.    Allergies  Allergen Reactions   Other Other (See Comments)    Sedation = BVM (??) per notes  "Pt with shallow respirations. Assisted by Rt with BVM"   Reclast [Zoledronic Acid]     Developed bony growths in her mouth.     Review of Systems  HENT:  Positive for hearing loss. Negative for congestion.   Eyes:  Negative for blurred vision.  Respiratory:  Negative for cough.   Cardiovascular:  Negative for leg swelling.  Gastrointestinal:  Negative for constipation and diarrhea.  Genitourinary:  Negative for dysuria.  Musculoskeletal:  Positive for joint pain. Negative for myalgias.  Skin:        Abscess left jaw  Neurological:  Negative for headaches.       Objective:    Physical Exam Constitutional:      General: [name] not in acute distress.    Appearance: Normal appearance. [name] underweight.  HENT:     Head: Normocephalic and  atraumatic.     Comments: No internal mouth abscess noted Absence of teeth noted left lower mouth    Right Ear: External ear normal.     Left Ear: External ear normal.  Eyes:     General: No scleral icterus. Neck:     Thyroid: No thyromegaly.  Cardiovascular:     Rate and Rhythm: Normal rate and regular rhythm.     Heart sounds: Normal heart sounds. No murmur heard. Pulmonary:     Effort: Pulmonary effort is normal. No respiratory distress.     Breath sounds: Normal breath sounds. No wheezing.  Musculoskeletal:     Cervical back: Neck supple.  Skin:    General: Skin is warm and dry.     Comments: Firm abscess noted on left jawline Draining purlent drainage.  Abscess approximately 2-3 cm wide  Neurological:     Mental Status: [name] alert and oriented to person, place, and time.  Psychiatric:        Mood and Affect: Mood normal.        Behavior: Behavior normal.        Thought Content: Thought content normal.        Judgment: Judgment normal.      BP 94/61 (BP Location: Right Arm, Patient Position: Sitting, Cuff Size: Small)   Pulse 97   Temp (!) 97.5 F (36.4 C) (Oral)   Resp 16   SpO2 94%  Wt Readings from Last 3 Encounters:  02/06/23 130 lb (59 kg)  03/25/22 140 lb 9.6 oz (63.8 kg)  02/11/22 138 lb (62.6 kg)       Assessment & Plan:   Problem List Items Addressed This Visit       Unprioritized   Recurrent UTI    No recent UTI's since daughter-in-law started pt on cranberry tablets.  Monitor.  Overactive bladder    Still has some urinary incontinence. Continue detrol.       Malignant neoplasm of upper-outer quadrant of left breast in female, estrogen receptor positive (HCC)    She declines further mammography screening at this time.      Relevant Medications   aspirin EC 81 MG tablet   doxycycline (VIBRA-TABS) 100 MG tablet   Essential hypertension, benign    BP a bit low today- not on antihypertensives currently. Continue midodrine/hydration.        Relevant Medications   omega-3 acid ethyl esters (LOVAZA) 1 g capsule   aspirin EC 81 MG tablet   midodrine (PROAMATINE) 2.5 MG tablet   Depression    Begin citalopram per neuro recommendations.       Abscess of jaw - Primary    New. Will begin doxycycline bid x 7 days. Continue warm compresses.  I discussed imaging with pt and daughter-in-law who both decline further work up even though there is chance of malignancy.      Relevant Medications   doxycycline (VIBRA-TABS) 100 MG tablet   Other Visit Diagnoses     Hypokalemia       Relevant Orders   Basic Metabolic Panel (BMET)     Declines flu shot. She has a DNR in place and has the gold form on her refridgerator at home   I have discontinued Meriam Sprague S. Elias's traZODone. I have also changed her midodrine. Additionally, I am having her start on doxycycline. Lastly, I am having her maintain her multivitamin, potassium chloride, Melatonin, Lacosamide, levETIRAcetam, citalopram, omega-3 acid ethyl esters, aspirin EC, and tolterodine.  Meds ordered this encounter  Medications   doxycycline (VIBRA-TABS) 100 MG tablet    Sig: Take 1 tablet (100 mg total) by mouth 2 (two) times daily.    Dispense:  14 tablet    Refill:  0    Order Specific Question:   Supervising Provider    Answer:   Danise Edge A [[id number]]   tolterodine (DETROL LA) 4 MG 24 hr capsule    Sig: Take 1 capsule (4 mg total) by mouth daily.    Dispense:  90 capsule    Refill:  1    Order Specific Question:   Supervising Provider    Answer:   Danise Edge A [[id number]]   midodrine (PROAMATINE) 2.5 MG tablet    Sig: Take 1 tablet (2.5 mg total) by mouth 3 (three) times daily with meals.    Dispense:  270 tablet    Refill:  1    Order Specific Question:   Supervising Provider    Answer:   Danise Edge A [[id number]]

## 2023-02-07 NOTE — Assessment & Plan Note (Signed)
Begin citalopram per neuro recommendations.

## 2023-02-07 NOTE — Patient Instructions (Signed)
VISIT SUMMARY:  During your annual physical, we discussed your recent health activities and your family's medical history. You've been diligent with your health screenings, including mammograms, Pap smears, and colonoscopies. You've also completed your shingles vaccine and received your flu shot. We discussed the importance of getting a COVID booster shot. You've been maintaining a healthy diet and exercising regularly, which is excellent. We also discussed your son's vaping habits and your interest in getting your cholesterol and glucose levels checked.  YOUR PLAN:  -COVID BOOSTER SHOT: Given the ongoing pandemic, it's important to stay protected. I recommend getting a COVID booster shot at your local pharmacy.  -LAB WORK: To keep track of your health, I've ordered comprehensive lab work, including cholesterol and glucose levels. This will help [place] monitor any potential health risks.  -FAMILY HISTORY: We discussed your family's history of atrial fibrillation. At this time, there are no changes to your management plan based on this information.  INSTRUCTIONS:  Please get your COVID booster shot at your earliest convenience. Once the lab work is ordered, you can go to the lab at your convenience to get the tests done. There are no changes to your current health management plan. Please return to the clinic as needed.

## 2023-02-07 NOTE — Assessment & Plan Note (Signed)
She declines further mammography screening at this time.

## 2023-02-07 NOTE — Assessment & Plan Note (Signed)
Still has some urinary incontinence. Continue detrol.

## 2023-02-07 NOTE — Assessment & Plan Note (Signed)
No recent UTI's since daughter-in-law started pt on cranberry tablets.  Monitor.

## 2023-02-07 NOTE — Assessment & Plan Note (Signed)
BP a bit low today- not on antihypertensives currently. Continue midodrine/hydration.

## 2023-02-07 NOTE — Assessment & Plan Note (Signed)
New. Will begin doxycycline bid x 7 days. Continue warm compresses.  I discussed imaging with pt and daughter-in-law who both decline further work up even though there is chance of malignancy.

## 2023-02-10 ENCOUNTER — Encounter: Payer: Self-pay | Admitting: Neurology

## 2023-02-10 ENCOUNTER — Telehealth: Payer: Self-pay | Admitting: Neurology

## 2023-02-10 DIAGNOSIS — G40901 Epilepsy, unspecified, not intractable, with status epilepticus: Secondary | ICD-10-CM | POA: Diagnosis not present

## 2023-02-10 DIAGNOSIS — R413 Other amnesia: Secondary | ICD-10-CM | POA: Diagnosis not present

## 2023-02-10 DIAGNOSIS — M479 Spondylosis, unspecified: Secondary | ICD-10-CM | POA: Diagnosis not present

## 2023-02-10 DIAGNOSIS — F05 Delirium due to known physiological condition: Secondary | ICD-10-CM | POA: Diagnosis not present

## 2023-02-10 DIAGNOSIS — M8580 Other specified disorders of bone density and structure, unspecified site: Secondary | ICD-10-CM | POA: Diagnosis not present

## 2023-02-10 DIAGNOSIS — E785 Hyperlipidemia, unspecified: Secondary | ICD-10-CM | POA: Diagnosis not present

## 2023-02-10 DIAGNOSIS — M1990 Unspecified osteoarthritis, unspecified site: Secondary | ICD-10-CM | POA: Diagnosis not present

## 2023-02-10 DIAGNOSIS — F419 Anxiety disorder, unspecified: Secondary | ICD-10-CM | POA: Diagnosis not present

## 2023-02-10 DIAGNOSIS — I10 Essential (primary) hypertension: Secondary | ICD-10-CM | POA: Diagnosis not present

## 2023-02-10 NOTE — Telephone Encounter (Signed)
[facility] will be taking this patient. 

## 2023-02-10 NOTE — Telephone Encounter (Signed)
Yes

## 2023-02-10 NOTE — Telephone Encounter (Signed)
Terry Mcneil, PT with [facility] is calling for verbal orders for PT for 2 week 3 and 1 week 6, his voicemail is secure at [phone]

## 2023-02-11 LAB — ATN PROFILE
A -- Beta-amyloid 42/40 Ratio: 0.107 (ref >0.102)
Beta-amyloid 40: 231.7 pg/mL
Beta-amyloid 42: 24.76 pg/mL
N -- NfL, Plasma: 6.47 pg/mL (ref 0.00–11.55)
T -- p-tau181: 1.73 pg/mL — ABNORMAL HIGH (ref 0.00–0.97)

## 2023-02-11 LAB — LEVETIRACETAM LEVEL: Levetiracetam Lvl: 10.6 ug/mL (ref 10.0–40.0)

## 2023-02-11 LAB — LACOSAMIDE: Lacosamide: 10.5 ug/mL — ABNORMAL HIGH (ref 5.0–10.0)

## 2023-02-11 NOTE — Telephone Encounter (Signed)
Call to Mathis Fare, PT with [facility], no answer and unable to leave message. Dr. Teresa Coombs gave verbal orders for PT

## 2023-02-13 DIAGNOSIS — R413 Other amnesia: Secondary | ICD-10-CM | POA: Diagnosis not present

## 2023-02-13 DIAGNOSIS — M479 Spondylosis, unspecified: Secondary | ICD-10-CM | POA: Diagnosis not present

## 2023-02-13 DIAGNOSIS — E785 Hyperlipidemia, unspecified: Secondary | ICD-10-CM | POA: Diagnosis not present

## 2023-02-13 DIAGNOSIS — F419 Anxiety disorder, unspecified: Secondary | ICD-10-CM | POA: Diagnosis not present

## 2023-02-13 DIAGNOSIS — F05 Delirium due to known physiological condition: Secondary | ICD-10-CM | POA: Diagnosis not present

## 2023-02-13 DIAGNOSIS — I10 Essential (primary) hypertension: Secondary | ICD-10-CM | POA: Diagnosis not present

## 2023-02-13 DIAGNOSIS — M1990 Unspecified osteoarthritis, unspecified site: Secondary | ICD-10-CM | POA: Diagnosis not present

## 2023-02-13 DIAGNOSIS — G40901 Epilepsy, unspecified, not intractable, with status epilepticus: Secondary | ICD-10-CM | POA: Diagnosis not present

## 2023-02-13 DIAGNOSIS — M8580 Other specified disorders of bone density and structure, unspecified site: Secondary | ICD-10-CM | POA: Diagnosis not present

## 2023-02-18 DIAGNOSIS — M479 Spondylosis, unspecified: Secondary | ICD-10-CM | POA: Diagnosis not present

## 2023-02-18 DIAGNOSIS — R413 Other amnesia: Secondary | ICD-10-CM | POA: Diagnosis not present

## 2023-02-18 DIAGNOSIS — I10 Essential (primary) hypertension: Secondary | ICD-10-CM | POA: Diagnosis not present

## 2023-02-18 DIAGNOSIS — E785 Hyperlipidemia, unspecified: Secondary | ICD-10-CM | POA: Diagnosis not present

## 2023-02-18 DIAGNOSIS — M1990 Unspecified osteoarthritis, unspecified site: Secondary | ICD-10-CM | POA: Diagnosis not present

## 2023-02-18 DIAGNOSIS — F419 Anxiety disorder, unspecified: Secondary | ICD-10-CM | POA: Diagnosis not present

## 2023-02-18 DIAGNOSIS — G40901 Epilepsy, unspecified, not intractable, with status epilepticus: Secondary | ICD-10-CM | POA: Diagnosis not present

## 2023-02-18 DIAGNOSIS — M8580 Other specified disorders of bone density and structure, unspecified site: Secondary | ICD-10-CM | POA: Diagnosis not present

## 2023-02-18 DIAGNOSIS — F05 Delirium due to known physiological condition: Secondary | ICD-10-CM | POA: Diagnosis not present

## 2023-02-20 DIAGNOSIS — F419 Anxiety disorder, unspecified: Secondary | ICD-10-CM | POA: Diagnosis not present

## 2023-02-20 DIAGNOSIS — E785 Hyperlipidemia, unspecified: Secondary | ICD-10-CM | POA: Diagnosis not present

## 2023-02-20 DIAGNOSIS — M1990 Unspecified osteoarthritis, unspecified site: Secondary | ICD-10-CM | POA: Diagnosis not present

## 2023-02-20 DIAGNOSIS — M8580 Other specified disorders of bone density and structure, unspecified site: Secondary | ICD-10-CM | POA: Diagnosis not present

## 2023-02-20 DIAGNOSIS — R413 Other amnesia: Secondary | ICD-10-CM | POA: Diagnosis not present

## 2023-02-20 DIAGNOSIS — M479 Spondylosis, unspecified: Secondary | ICD-10-CM | POA: Diagnosis not present

## 2023-02-20 DIAGNOSIS — F05 Delirium due to known physiological condition: Secondary | ICD-10-CM | POA: Diagnosis not present

## 2023-02-20 DIAGNOSIS — G40901 Epilepsy, unspecified, not intractable, with status epilepticus: Secondary | ICD-10-CM | POA: Diagnosis not present

## 2023-02-20 DIAGNOSIS — I10 Essential (primary) hypertension: Secondary | ICD-10-CM | POA: Diagnosis not present

## 2023-02-24 DIAGNOSIS — M1990 Unspecified osteoarthritis, unspecified site: Secondary | ICD-10-CM | POA: Diagnosis not present

## 2023-02-24 DIAGNOSIS — F05 Delirium due to known physiological condition: Secondary | ICD-10-CM | POA: Diagnosis not present

## 2023-02-24 DIAGNOSIS — E785 Hyperlipidemia, unspecified: Secondary | ICD-10-CM | POA: Diagnosis not present

## 2023-02-24 DIAGNOSIS — M8580 Other specified disorders of bone density and structure, unspecified site: Secondary | ICD-10-CM | POA: Diagnosis not present

## 2023-02-24 DIAGNOSIS — I10 Essential (primary) hypertension: Secondary | ICD-10-CM | POA: Diagnosis not present

## 2023-02-24 DIAGNOSIS — R413 Other amnesia: Secondary | ICD-10-CM | POA: Diagnosis not present

## 2023-02-24 DIAGNOSIS — F419 Anxiety disorder, unspecified: Secondary | ICD-10-CM | POA: Diagnosis not present

## 2023-02-24 DIAGNOSIS — M479 Spondylosis, unspecified: Secondary | ICD-10-CM | POA: Diagnosis not present

## 2023-02-24 DIAGNOSIS — G40901 Epilepsy, unspecified, not intractable, with status epilepticus: Secondary | ICD-10-CM | POA: Diagnosis not present

## 2023-02-26 ENCOUNTER — Telehealth: Payer: Self-pay

## 2023-02-26 NOTE — Telephone Encounter (Signed)
Faxed plan of care to [facility]

## 2023-02-27 DIAGNOSIS — R413 Other amnesia: Secondary | ICD-10-CM | POA: Diagnosis not present

## 2023-02-27 DIAGNOSIS — M479 Spondylosis, unspecified: Secondary | ICD-10-CM | POA: Diagnosis not present

## 2023-02-27 DIAGNOSIS — I10 Essential (primary) hypertension: Secondary | ICD-10-CM | POA: Diagnosis not present

## 2023-02-27 DIAGNOSIS — F05 Delirium due to known physiological condition: Secondary | ICD-10-CM | POA: Diagnosis not present

## 2023-02-27 DIAGNOSIS — E785 Hyperlipidemia, unspecified: Secondary | ICD-10-CM | POA: Diagnosis not present

## 2023-02-27 DIAGNOSIS — M1990 Unspecified osteoarthritis, unspecified site: Secondary | ICD-10-CM | POA: Diagnosis not present

## 2023-02-27 DIAGNOSIS — M8580 Other specified disorders of bone density and structure, unspecified site: Secondary | ICD-10-CM | POA: Diagnosis not present

## 2023-02-27 DIAGNOSIS — G40901 Epilepsy, unspecified, not intractable, with status epilepticus: Secondary | ICD-10-CM | POA: Diagnosis not present

## 2023-02-27 DIAGNOSIS — F419 Anxiety disorder, unspecified: Secondary | ICD-10-CM | POA: Diagnosis not present

## 2023-03-03 DIAGNOSIS — G40901 Epilepsy, unspecified, not intractable, with status epilepticus: Secondary | ICD-10-CM | POA: Diagnosis not present

## 2023-03-03 DIAGNOSIS — E785 Hyperlipidemia, unspecified: Secondary | ICD-10-CM | POA: Diagnosis not present

## 2023-03-03 DIAGNOSIS — M8580 Other specified disorders of bone density and structure, unspecified site: Secondary | ICD-10-CM | POA: Diagnosis not present

## 2023-03-03 DIAGNOSIS — I10 Essential (primary) hypertension: Secondary | ICD-10-CM | POA: Diagnosis not present

## 2023-03-03 DIAGNOSIS — M1990 Unspecified osteoarthritis, unspecified site: Secondary | ICD-10-CM | POA: Diagnosis not present

## 2023-03-03 DIAGNOSIS — M479 Spondylosis, unspecified: Secondary | ICD-10-CM | POA: Diagnosis not present

## 2023-03-03 DIAGNOSIS — R413 Other amnesia: Secondary | ICD-10-CM | POA: Diagnosis not present

## 2023-03-03 DIAGNOSIS — F05 Delirium due to known physiological condition: Secondary | ICD-10-CM | POA: Diagnosis not present

## 2023-03-03 DIAGNOSIS — F419 Anxiety disorder, unspecified: Secondary | ICD-10-CM | POA: Diagnosis not present

## 2023-03-10 ENCOUNTER — Telehealth: Payer: Self-pay | Admitting: Family

## 2023-03-10 DIAGNOSIS — G40901 Epilepsy, unspecified, not intractable, with status epilepticus: Secondary | ICD-10-CM | POA: Diagnosis not present

## 2023-03-10 DIAGNOSIS — R413 Other amnesia: Secondary | ICD-10-CM | POA: Diagnosis not present

## 2023-03-10 DIAGNOSIS — F05 Delirium due to known physiological condition: Secondary | ICD-10-CM | POA: Diagnosis not present

## 2023-03-10 DIAGNOSIS — E785 Hyperlipidemia, unspecified: Secondary | ICD-10-CM | POA: Diagnosis not present

## 2023-03-10 DIAGNOSIS — M8580 Other specified disorders of bone density and structure, unspecified site: Secondary | ICD-10-CM | POA: Diagnosis not present

## 2023-03-10 DIAGNOSIS — F419 Anxiety disorder, unspecified: Secondary | ICD-10-CM | POA: Diagnosis not present

## 2023-03-10 DIAGNOSIS — M1990 Unspecified osteoarthritis, unspecified site: Secondary | ICD-10-CM | POA: Diagnosis not present

## 2023-03-10 DIAGNOSIS — I10 Essential (primary) hypertension: Secondary | ICD-10-CM | POA: Diagnosis not present

## 2023-03-10 DIAGNOSIS — M479 Spondylosis, unspecified: Secondary | ICD-10-CM | POA: Diagnosis not present

## 2023-03-10 NOTE — Telephone Encounter (Signed)
Terry Mcneil form [facility] physical therapy called and stated that she was currently with the patient and noticed a wound on the bottom of her chin on the left side.  Terry Mcneil stated that she scratched it a few weeks ago and since then, it has began to grow bigger overtime and produces puss and fluid.   I asked Terry Mcneil w/ [facility] if Terry Mcneil would like for me to schedule her an appt since they were together at the time Terry Mcneil called but the patient requested for Terry Mcneil to call her daughter Terry Mcneil instead. Please advise.

## 2023-03-10 NOTE — Telephone Encounter (Signed)
Called daughter in law Misty Stanley [phone], she reports is the same abscess patient was evaluated for in September. It was better after antibiotics but patient keeps picking at it and it got worse again. Please advise on best course of action, she reports hard to get patient out of the house. She can try to do a virtual visit if needed or can she get treatment and family will try to keep it cover.

## 2023-03-11 ENCOUNTER — Encounter: Payer: Self-pay | Admitting: Family

## 2023-03-11 DIAGNOSIS — M272 Inflammatory conditions of jaws: Secondary | ICD-10-CM

## 2023-03-11 MED ORDER — DOXYCYCLINE HYCLATE 100 MG PO TABS: 100.0000 mg | ORAL_TABLET | Freq: Two times a day (BID) | ORAL | 0 refills | Status: DC

## 2023-03-11 NOTE — Telephone Encounter (Signed)
Patient has in office visit tomorrow am

## 2023-03-12 ENCOUNTER — Ambulatory Visit: Payer: Medicare PPO | Admitting: Family

## 2023-03-12 VITALS — BP 127/69 | HR 85 | Temp 97.6°F | Resp 16 | Ht 64.0 in | Wt 115.0 lb

## 2023-03-12 DIAGNOSIS — G40901 Epilepsy, unspecified, not intractable, with status epilepticus: Secondary | ICD-10-CM | POA: Diagnosis not present

## 2023-03-12 DIAGNOSIS — L0211 Cutaneous abscess of neck: Secondary | ICD-10-CM | POA: Diagnosis not present

## 2023-03-12 DIAGNOSIS — R413 Other amnesia: Secondary | ICD-10-CM | POA: Diagnosis not present

## 2023-03-12 DIAGNOSIS — F05 Delirium due to known physiological condition: Secondary | ICD-10-CM | POA: Diagnosis not present

## 2023-03-12 DIAGNOSIS — M8580 Other specified disorders of bone density and structure, unspecified site: Secondary | ICD-10-CM | POA: Diagnosis not present

## 2023-03-12 DIAGNOSIS — I10 Essential (primary) hypertension: Secondary | ICD-10-CM | POA: Diagnosis not present

## 2023-03-12 DIAGNOSIS — E785 Hyperlipidemia, unspecified: Secondary | ICD-10-CM | POA: Diagnosis not present

## 2023-03-12 DIAGNOSIS — M479 Spondylosis, unspecified: Secondary | ICD-10-CM | POA: Diagnosis not present

## 2023-03-12 DIAGNOSIS — M1990 Unspecified osteoarthritis, unspecified site: Secondary | ICD-10-CM | POA: Diagnosis not present

## 2023-03-12 DIAGNOSIS — F419 Anxiety disorder, unspecified: Secondary | ICD-10-CM | POA: Diagnosis not present

## 2023-03-12 LAB — BASIC METABOLIC PANEL
BUN: 16 mg/dL (ref 6–23)
CO2: 28 meq/L (ref 19–32)
Calcium: 9.6 mg/dL (ref 8.4–10.5)
Chloride: 103 meq/L (ref 96–112)
Creatinine, Ser: 0.6 mg/dL (ref 0.40–1.20)
GFR: 83.11 mL/min (ref >60.00)
Glucose, Bld: 109 mg/dL — ABNORMAL HIGH (ref 70–99)
Potassium: 3.8 meq/L (ref 3.5–5.1)
Sodium: 142 meq/L (ref 135–145)

## 2023-03-12 NOTE — Patient Instructions (Signed)
VISIT SUMMARY:  During today's visit, we discussed the persistent issue on your chin, which initially improved with antibiotics but has since worsened. We also reviewed your mental health status and general health maintenance.  YOUR PLAN:  -ABSCESS: You have a persistent lesion on your chin that has not healed and sometimes drains. This could be due to an underlying bone infection. We will continue your current antibiotic, Doxycycline, and have ordered a CT scan with contrast to get a better look at the area. We will also check your kidney function before the scan. Depending on the results, we may refer you to a head and neck specialist.  -MENTAL HEALTH: You recently started taking Citalopram which has been prescribed by your neurologist to help with your mood. Please continue with this medication and monitor for any changes in your mood.  -GENERAL HEALTH MAINTENANCE: You declined the flu shot today. We will check your potassium levels since you are taking a potassium supplement.  INSTRUCTIONS:  Please continue taking Doxycycline and Citalopram as prescribed. We will schedule a CT scan with contrast and check your kidney function beforehand. Depending on the CT results, you may need to see a head and neck specialist. Additionally, we will check your potassium levels.

## 2023-03-12 NOTE — Progress Notes (Signed)
Subjective:     Patient ID: Terry Mcneil, female    DOB: [birth year], 82 y.o.   MRN: [id number]  Chief Complaint  Patient presents with   Abscess    Here for follow up of abscess of the jaw    Abscess    Discussed the use of AI scribe software for clinical note transcription with the patient, who gave verbal consent to proceed.  History of Present Illness   Terry Mcneil, a patient with a history of breast cancer, presents with a persistent abscess on her left lateral chin. [name] accompanied by her son and daughter-in-law who are her primary caregivers. The problem initially improved with a course of doxycycline prescribed in September but has since worsened. The patient denies pain but reports some drainage from the area. The caregiver notes that Terry Mcneil has been itching at the area, which may have contributed to the current state. The caregiver also speculates that "another bone" might be trying to emerge from the area, similar to previous instances where pieces of bone has come out from inside her mouth. Terry Mcneil also has a history of bone growth in her jaw with similar abscess on the left which healed. She has been on citalopram for about two weeks, and her mood is being monitored.          Health Maintenance Due  Topic Date Due   Zoster Vaccines- Shingrix (1 of 2) 12/16/1959   COVID-19 Vaccine (3 - Pfizer risk series) 07/20/2019   DTaP/Tdap/Td (3 - Tdap) 09/09/2020   Medicare Annual Wellness (AWV)  12/05/2020    Past Medical History:  Diagnosis Date   Arthritis    back   Cancer (HCC) 09/2016   left breast cancer   Cataract    Colon polyps    Hypertension    Osteopenia 10/11/2009   Patellar fracture    Right Knee    Urge incontinence     Past Surgical History:  Procedure Laterality Date   BREAST LUMPECTOMY Left 09/2016   COLONOSCOPY     EYE SURGERY Bilateral    Cataracts    HIP CLOSED REDUCTION Left 02/13/2019   Procedure: CLOSED MANIPULATION OF DISLOCATED HIP;   Surgeon: Nadara Mustard, MD;  Location: [facility];  Service: Orthopedics;  Laterality: Left;   ORIF PATELLA Right 08/28/2012   Procedure: OPEN REDUCTION INTERNAL (ORIF) FIXATION PATELLA- RIGHT;  Surgeon: Nadara Mustard, MD;  Location: [facility] OR;  Service: Orthopedics;  Laterality: Right;  Open Reduction Internal Fixation Right Patella   RADIOACTIVE SEED GUIDED PARTIAL MASTECTOMY WITH AXILLARY SENTINEL LYMPH NODE BIOPSY Left 10/03/2016   Procedure: LEFT BREAST RADIOACTIVE SEED GUIDED PARTIAL MASTECTOMY WITH LEFT SENTINEL LYMPH NODE MAPPING;  Surgeon: Harriette Bouillon, MD;  Location: [facility];  Service: General;  Laterality: Left;   TONSILLECTOMY     TOTAL HIP ARTHROPLASTY Left 01/13/2018   Procedure: LEFT TOTAL HIP ARTHROPLASTY ANTERIOR APPROACH;  Surgeon: Kathryne Hitch, MD;  Location: [facility];  Service: Orthopedics;  Laterality: Left;   TUBAL LIGATION     VAGINAL DELIVERY     x2 -     Family History  Problem Relation Age of Onset   Heart disease Father 51       valve replacement    Social History   Socioeconomic History   Marital status: Widowed    Spouse name: Martin Umpierre   Number of children: 2   Years of education: Not on file   Highest education level: Not on  file  Occupational History   Occupation: Retired  Tobacco Use   Smoking status: Former    Current packs/day: 0.00    Types: Cigarettes    Quit date: 08/27/1978    Years since quitting: 44.5   Smokeless tobacco: Never  Vaping Use   Vaping status: Never Used  Substance and Sexual Activity   Alcohol use: Yes    Alcohol/week: 7.0 standard drinks of alcohol    Types: 7 Glasses of wine per week   Drug use: No   Sexual activity: Yes  Other Topics Concern   Not on file  Social History Narrative   Marital Status:  Married ([name]) Husband has been diagnosed with Alzheimers.  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     Alcohol Use:  Moderate (Wine), 2 glasses 3 times a week   Drug Use:  None   Diet:  Weight Watchers    Exercise:  Not regular   Hobbies: Reading , Gardening , Grandchildren            Social Determinants of Health   Financial Resource Strain: Low Risk  (12/06/2019)   Overall Financial Resource Strain (CARDIA)    Difficulty of Paying Living Expenses: Not hard at all  Food Insecurity: No Food Insecurity (12/06/2019)   Hunger Vital Sign    Worried About Running Out of Food in the Last Year: Never true    Ran Out of Food in the Last Year: Never true  Transportation Needs: No Transportation Needs (12/06/2019)   PRAPARE - Administrator, Civil Service (Medical): No    Lack of Transportation (Non-Medical): No  Physical Activity: Not on file  Stress: Not on file  Social Connections: Not on file  Intimate Partner Violence: Not on file    Outpatient Medications Prior to Visit  Medication Sig Dispense Refill   aspirin EC 81 MG tablet Take 81 mg by mouth daily. Swallow whole.     citalopram (CELEXA) 20 MG tablet Take 1 tablet (20 mg total) by mouth daily. 30 tablet 3   doxycycline (VIBRA-TABS) 100 MG tablet Take 1 tablet (100 mg total) by mouth 2 (two) times daily. 14 tablet 0   Lacosamide 100 MG TABS Take 1 tablet (100 mg total) by mouth 2 (two) times daily. 180 tablet 3   levETIRAcetam (KEPPRA) 250 MG tablet Take 1 tablet (250 mg total) by mouth 2 (two) times daily. 180 tablet 3   Melatonin 10 MG TABS Take 30 mg by mouth at bedtime.     midodrine (PROAMATINE) 2.5 MG tablet Take 1 tablet (2.5 mg total) by mouth 3 (three) times daily with meals. 270 tablet 1   Multiple Vitamin (MULTIVITAMIN) tablet Take 1 tablet by mouth daily.     omega-3 acid ethyl esters (LOVAZA) 1 g capsule Take by mouth 2 (two) times daily.      potassium chloride (KLOR-CON M) 10 MEQ tablet Take 1 tablet by mouth daily.     tolterodine (DETROL LA) 4 MG 24 hr capsule Take 1 capsule (4 mg total) by mouth daily. 90 capsule 1   No facility-administered medications prior to visit.    Allergies  Allergen Reactions   Other Other (See Comments)  Sedation = BVM (??) per notes  "Pt with shallow respirations. Assisted by Rt with BVM"   Reclast [Zoledronic Acid]     Developed bony growths in her mouth.     ROS See HPI    Objective:    Physical Exam Constitutional:      General: [name] not in acute distress.    Appearance: Normal appearance. [name] well-developed.  HENT:     Head: Normocephalic and atraumatic.     Right Ear: External ear normal.     Left Ear: External ear normal.  Eyes:     General: No scleral icterus. Neck:     Thyroid: No thyromegaly.     Comments: Abscess noted left jawline, open in center with scant drainage, no obvious fluctuance, no inderation but + surrounding soft tissue swelling Cardiovascular:     Rate and Rhythm: Normal rate and regular rhythm.     Heart sounds: Normal heart sounds. No murmur heard. Pulmonary:     Effort: Pulmonary effort is normal. No respiratory distress.     Breath sounds: Normal breath sounds. No wheezing.  Musculoskeletal:     Cervical back: Neck supple.  Skin:    General: Skin is warm and dry.  Neurological:     Mental Status: [name] alert and oriented to person, place, and time.  Psychiatric:        Mood and Affect: Mood normal.        Behavior: Behavior normal.        Thought Content: Thought content normal.        Judgment: Judgment normal.      BP 127/69 (BP Location: Right Arm, Patient Position: Sitting, Cuff Size: Small)   Pulse 85   Temp 97.6 F (36.4 C) (Oral)   Resp 16   Ht 5\' 4"  (1.626 m)   Wt 115 lb (52.2 kg)   SpO2 97%   BMI 19.74 kg/m  Wt Readings from Last 3 Encounters:  03/12/23 115 lb (52.2 kg)  02/06/23 130 lb (59 kg)  03/25/22  140 lb 9.6 oz (63.8 kg)       Assessment & Plan:   Problem List Items Addressed This Visit       Unprioritized   Neck abscess - Primary     Persistent, non-healing lesion on the chin with intermittent drainage. Previous history of bone fragments extruding from similar lesions. No pain reported. Antibiotics provided some temporary relief. Possible underlying bone infection. -Continue Doxycycline as prescribed. -Order CT scan with contrast to assess for possible bone infection or abscess. -Check kidney function prior to CT scan. -Consider referral to head and neck specialist based on CT results.       Relevant Orders   Basic Metabolic Panel (BMET)   CT Soft Tissue Neck W Contrast    General Health Maintenance -Declined flu shot. -Check potassium levels as patient is on potassium supplement.  I am having Terry Mcneil maintain her multivitamin, potassium chloride, Melatonin, Lacosamide, levETIRAcetam, citalopram, omega-3 acid ethyl esters, aspirin EC, tolterodine, midodrine, and doxycycline.  No orders of the defined types were placed in this encounter.

## 2023-03-12 NOTE — Assessment & Plan Note (Signed)
  Persistent, non-healing lesion on the chin with intermittent drainage. Previous history of bone fragments extruding from similar lesions. No pain reported. Antibiotics provided some temporary relief. Possible underlying bone infection. -Continue Doxycycline as prescribed. -Order CT scan with contrast to assess for possible bone infection or abscess. -Check kidney function prior to CT scan. -Consider referral to head and neck specialist based on CT results.

## 2023-03-18 DIAGNOSIS — F419 Anxiety disorder, unspecified: Secondary | ICD-10-CM | POA: Diagnosis not present

## 2023-03-18 DIAGNOSIS — E785 Hyperlipidemia, unspecified: Secondary | ICD-10-CM | POA: Diagnosis not present

## 2023-03-18 DIAGNOSIS — G40901 Epilepsy, unspecified, not intractable, with status epilepticus: Secondary | ICD-10-CM | POA: Diagnosis not present

## 2023-03-18 DIAGNOSIS — R413 Other amnesia: Secondary | ICD-10-CM | POA: Diagnosis not present

## 2023-03-18 DIAGNOSIS — M1990 Unspecified osteoarthritis, unspecified site: Secondary | ICD-10-CM | POA: Diagnosis not present

## 2023-03-18 DIAGNOSIS — I10 Essential (primary) hypertension: Secondary | ICD-10-CM | POA: Diagnosis not present

## 2023-03-18 DIAGNOSIS — M8580 Other specified disorders of bone density and structure, unspecified site: Secondary | ICD-10-CM | POA: Diagnosis not present

## 2023-03-18 DIAGNOSIS — M479 Spondylosis, unspecified: Secondary | ICD-10-CM | POA: Diagnosis not present

## 2023-03-18 DIAGNOSIS — F05 Delirium due to known physiological condition: Secondary | ICD-10-CM | POA: Diagnosis not present

## 2023-03-21 ENCOUNTER — Telehealth: Payer: Self-pay | Admitting: Family

## 2023-03-21 ENCOUNTER — Encounter (HOSPITAL_BASED_OUTPATIENT_CLINIC_OR_DEPARTMENT_OTHER): Payer: Self-pay

## 2023-03-21 ENCOUNTER — Encounter: Payer: Self-pay | Admitting: Family

## 2023-03-21 ENCOUNTER — Ambulatory Visit (HOSPITAL_BASED_OUTPATIENT_CLINIC_OR_DEPARTMENT_OTHER)
Admission: RE | Admit: 2023-03-21 | Discharge: 2023-03-21 | Disposition: A | Discharge status: Home / Self Care | Payer: Medicare PPO | Source: Ambulatory Visit | Attending: Family | Admitting: Family

## 2023-03-21 DIAGNOSIS — M272 Inflammatory conditions of jaws: Secondary | ICD-10-CM | POA: Diagnosis not present

## 2023-03-21 DIAGNOSIS — C7951 Secondary malignant neoplasm of bone: Secondary | ICD-10-CM | POA: Diagnosis not present

## 2023-03-21 DIAGNOSIS — L0211 Cutaneous abscess of neck: Secondary | ICD-10-CM | POA: Diagnosis not present

## 2023-03-21 DIAGNOSIS — I6522 Occlusion and stenosis of left carotid artery: Secondary | ICD-10-CM | POA: Diagnosis not present

## 2023-03-21 DIAGNOSIS — C50919 Malignant neoplasm of unspecified site of unspecified female breast: Secondary | ICD-10-CM | POA: Diagnosis not present

## 2023-03-21 MED ORDER — IOHEXOL 300 MG/ML  SOLN
100.0000 mL | Freq: Once | INTRAMUSCULAR | Status: AC | PRN
  Administered 2023-03-21: 75 mL via INTRAVENOUS

## 2023-03-21 NOTE — Telephone Encounter (Signed)
Left detailed message on voicemail for Mercades Komm (daughter-in-law), advising her of pathologic fracture/osteomyelitis.  Advised that this is treated with IV abx and would require that she go to the ER for admission if they wish to treat.  If they wish to pursue only comfort measures, then we should discuss home hospice referral.  I am out of the office until Tuesday, however I asked Misty Stanley to speak with her husband and send me their thoughts/questions via Clinical cytogeneticist.

## 2023-03-24 ENCOUNTER — Telehealth: Payer: Self-pay | Admitting: Family

## 2023-03-24 NOTE — Telephone Encounter (Signed)
Please contact family to schedule a virtual visit to discuss goals of care this week as well as to discuss her CT scan results in further detail.  I left a detailed message for her daughter-in-law on her cell last week. (See also 11/8 phone note).

## 2023-03-24 NOTE — Telephone Encounter (Signed)
Spoke to patient's daughter in law Garrett and scheduled appointment for tomorrow at 5 pm

## 2023-03-25 ENCOUNTER — Telehealth: Payer: Medicare PPO | Admitting: Family

## 2023-03-25 DIAGNOSIS — S02602A Fracture of unspecified part of body of left mandible, initial encounter for closed fracture: Secondary | ICD-10-CM | POA: Diagnosis not present

## 2023-03-25 DIAGNOSIS — M869 Osteomyelitis, unspecified: Secondary | ICD-10-CM | POA: Insufficient documentation

## 2023-03-25 NOTE — Assessment & Plan Note (Signed)
We discussed two approaches to this clinical situation.   First would be admission for formal work up and initiation of IV abx for osteomyelitis. We discussed that it is possible that her jaw fracture could be due to malignancy. She does have a previous hx of breast cancer. I advised family that I am not sure what would be recommended if it was discovered that she has cancer in the bone of her jaw, perhaps radiation or chemo but that would need to be decided by the specialists.  Second option would be comfort care and to stay at home with home hospice for symptom management.    Family is not sure that patient is up for additional work up, hospitalization or treatments.  They wish to talk this over with patient's other son and will send me a note updating me on their decision moving forward.  The patient has issues with cognition and memory and they do not feel that she has the capacity to make this decision independently.  They also are concerned that this discussion will cause her increased depression at this time.   The patient does have formal HCPOA paperwork and the daughter-in-law will send me a copy of this for the patient's record.  She has an out of hospital DNR.

## 2023-03-25 NOTE — Progress Notes (Unsigned)
MyChart Video Visit    Virtual Visit via Video Note    Patient location: Home. Patient's son, daughter-in-law and provider in visit Provider location: Office  I discussed the limitations of evaluation and management by telemedicine and the availability of in person appointments. The patient expressed understanding and agreed to proceed.  Visit Date: 03/25/2023  Today's healthcare provider: Lemont Fillers, NP     Subjective:    Patient ID: Terry Mcneil, female    DOB: Jan 15, 1941, 82 y.o.   MRN: [id number]  Chief Complaint  Patient presents with  . Abscess    Discussion of plan of care for abscess of the jaw    HPI  Patient's son and daughter-in-law present today to discuss goals of care for Terry Mcneil. She has had a non-healing ulcer of the left lower jaw for some time.  We obtained a CT scan which noted the following:   IMPRESSION: 1. Left Mandible Body and Symphysis Osteomyelitis with pathologic fracture, and bony sequestrum within osteolytic lesions of the mandible body. Underlying mandible bone metastasis difficult to exclude in the setting of breast cancer but is NOT favored; heterogeneous bone mineralization otherwise limited to the cervical spine appears to be degenerative in nature.   2. Abnormal superficial soft tissues overlying the mandible. But only mild deep space inflammation limited to the left submandibular space. Reactive left level 1 lymph nodes. No fluid collection or abscess.   3. No metastatic disease identified elsewhere in the neck or upper chest. Aortic Atherosclerosis (ICD10-I70.0) and Emphysema (ICD10-J43.9). Bulky calcified left carotid atherosclerosis.   Past Medical History:  Diagnosis Date  . Arthritis    back  . Cancer (HCC) 09/2016   left breast cancer  . Cataract   . Colon polyps   . Hypertension   . Osteopenia 10/11/2009  . Patellar fracture    Right Knee   . Urge incontinence     Past Surgical History:   Procedure Laterality Date  . BREAST LUMPECTOMY Left 09/2016  . COLONOSCOPY    . EYE SURGERY Bilateral    Cataracts   . HIP CLOSED REDUCTION Left 02/13/2019   Procedure: CLOSED MANIPULATION OF DISLOCATED HIP;  Surgeon: Nadara Mustard, MD;  Location: [facility];  Service: Orthopedics;  Laterality: Left;  . ORIF PATELLA Right 08/28/2012   Procedure: OPEN REDUCTION INTERNAL (ORIF) FIXATION PATELLA- RIGHT;  Surgeon: Nadara Mustard, MD;  Location: [facility] OR;  Service: Orthopedics;  Laterality: Right;  Open Reduction Internal Fixation Right Patella  . RADIOACTIVE SEED GUIDED PARTIAL MASTECTOMY WITH AXILLARY SENTINEL LYMPH NODE BIOPSY Left 10/03/2016   Procedure: LEFT BREAST RADIOACTIVE SEED GUIDED PARTIAL MASTECTOMY WITH LEFT SENTINEL LYMPH NODE MAPPING;  Surgeon: Harriette Bouillon, MD;  Location: [facility];  Service: General;  Laterality: Left;  . TONSILLECTOMY    . TOTAL HIP ARTHROPLASTY Left 01/13/2018   Procedure: LEFT TOTAL HIP ARTHROPLASTY ANTERIOR APPROACH;  Surgeon: Kathryne Hitch, MD;  Location: [facility] OR;  Service: Orthopedics;  Laterality: Left;  . TUBAL LIGATION    . VAGINAL DELIVERY     x2 -     Family History  Problem Relation Age of Onset  . Heart disease Father 54       valve replacement    Social History   Socioeconomic History  . Marital status: Widowed    Spouse name: Kallan Herriman  . Number of children: 2  . Years of education: Not on file  . Highest education level: Not on file  Occupational History  . Occupation: Retired  Tobacco Use  . Smoking status: Former    Current packs/day: 0.00    Types: Cigarettes    Quit date: 08/27/1978    Years since quitting: 44.6  . Smokeless tobacco: Never  Vaping Use  . Vaping status: Never Used  Substance and Sexual Activity  . Alcohol use: Yes    Alcohol/week: 7.0 standard drinks of alcohol    Types: 7 Glasses of wine per week  . Drug use: No  . Sexual activity: Yes  Other Topics Concern  . Not on file  Social  History Narrative   Marital Status:  Married ([name]) Husband has been diagnosed with Alzheimers.  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     Alcohol Use:  Moderate (Wine), 2 glasses 3 times a week   Drug Use:  None   Diet:  Weight Watchers    Exercise:  Not regular   Hobbies: Reading , Gardening , Grandchildren            Social Determinants of Health   Financial Resource Strain: Low Risk  (12/06/2019)   Overall Financial Resource Strain (CARDIA)   . Difficulty of Paying Living Expenses: Not hard at all  Food Insecurity: No Food Insecurity (12/06/2019)   Hunger Vital Sign   . Worried About Programme researcher, broadcasting/film/video in the Last Year: Never true   . Ran Out of Food in the Last Year: Never true  Transportation Needs: No Transportation Needs (12/06/2019)   PRAPARE - Transportation   . Lack of Transportation (Medical): No   . Lack of Transportation (Non-Medical): No  Physical Activity: Not on file  Stress: Not on file  Social Connections: Not on file  Intimate Partner Violence: Not on file    Outpatient Medications Prior to Visit  Medication Sig Dispense Refill  . aspirin EC 81 MG tablet Take 81 mg by mouth daily. Swallow whole.    . citalopram (CELEXA) 20 MG tablet Take 1 tablet (20 mg total) by mouth daily. 30 tablet 3  . doxycycline (VIBRA-TABS) 100 MG tablet Take 1 tablet (100 mg total) by mouth 2 (two) times daily. 14 tablet 0  . Lacosamide 100 MG TABS Take 1 tablet (100 mg total) by mouth 2 (two) times daily. 180 tablet 3  . levETIRAcetam (KEPPRA) 250 MG tablet Take 1 tablet (250 mg total) by mouth 2 (two) times daily. 180 tablet 3  . Melatonin 10 MG TABS Take 30 mg by mouth at bedtime.    . midodrine (PROAMATINE) 2.5 MG  tablet Take 1 tablet (2.5 mg total) by mouth 3 (three) times daily with meals. 270 tablet 1  . Multiple Vitamin (MULTIVITAMIN) tablet Take 1 tablet by mouth daily.    Marland Kitchen omega-3 acid ethyl esters (LOVAZA) 1 g capsule Take by mouth 2 (two) times daily.    . potassium chloride (KLOR-CON M) 10 MEQ tablet Take 1 tablet by mouth daily.    Marland Kitchen tolterodine (DETROL LA) 4 MG 24 hr capsule Take 1 capsule (4 mg total) by mouth daily. 90 capsule 1   No facility-administered medications prior to visit.    Allergies  Allergen Reactions  . Other Other (See Comments)  Sedation = BVM (??) per notes  "Pt with shallow respirations. Assisted by Rt with BVM"  . Reclast [Zoledronic Acid]     Developed bony growths in her mouth.     ROS     Objective:    Physical Exam  There were no vitals taken for this visit. Wt Readings from Last 3 Encounters:  03/12/23 115 lb (52.2 kg)  02/06/23 130 lb (59 kg)  03/25/22 140 lb 9.6 oz (63.8 kg)   Patient not present for today's goals of care discussion.     Assessment & Plan:   Problem List Items Addressed This Visit   None   I am having Terry Mcneil maintain her multivitamin, potassium chloride, Melatonin, Lacosamide, levETIRAcetam, citalopram, omega-3 acid ethyl esters, aspirin EC, tolterodine, midodrine, and doxycycline.  No orders of the defined types were placed in this encounter.   I discussed the assessment and treatment plan with the patient. The patient was provided an opportunity to ask questions and all were answered. The patient agreed with the plan and demonstrated an understanding of the instructions.   The patient was advised to call back or seek an in-person evaluation if the symptoms worsen or if the condition fails to improve as anticipated.   Lemont Fillers, NP [facility] [phone] (phone) [phone] (fax)  [facility]

## 2023-03-26 DIAGNOSIS — R413 Other amnesia: Secondary | ICD-10-CM | POA: Diagnosis not present

## 2023-03-26 DIAGNOSIS — F05 Delirium due to known physiological condition: Secondary | ICD-10-CM | POA: Diagnosis not present

## 2023-03-26 DIAGNOSIS — I10 Essential (primary) hypertension: Secondary | ICD-10-CM | POA: Diagnosis not present

## 2023-03-26 DIAGNOSIS — M479 Spondylosis, unspecified: Secondary | ICD-10-CM | POA: Diagnosis not present

## 2023-03-26 DIAGNOSIS — E785 Hyperlipidemia, unspecified: Secondary | ICD-10-CM | POA: Diagnosis not present

## 2023-03-26 DIAGNOSIS — M8580 Other specified disorders of bone density and structure, unspecified site: Secondary | ICD-10-CM | POA: Diagnosis not present

## 2023-03-26 DIAGNOSIS — F419 Anxiety disorder, unspecified: Secondary | ICD-10-CM | POA: Diagnosis not present

## 2023-03-26 DIAGNOSIS — G40901 Epilepsy, unspecified, not intractable, with status epilepticus: Secondary | ICD-10-CM | POA: Diagnosis not present

## 2023-03-26 DIAGNOSIS — M1990 Unspecified osteoarthritis, unspecified site: Secondary | ICD-10-CM | POA: Diagnosis not present

## 2023-03-28 ENCOUNTER — Telehealth: Payer: Self-pay | Admitting: Family

## 2023-03-28 NOTE — Telephone Encounter (Signed)
See mychart.  

## 2023-03-31 ENCOUNTER — Other Ambulatory Visit: Payer: Self-pay | Admitting: Family

## 2023-03-31 DIAGNOSIS — G40901 Epilepsy, unspecified, not intractable, with status epilepticus: Secondary | ICD-10-CM | POA: Diagnosis not present

## 2023-03-31 DIAGNOSIS — M1990 Unspecified osteoarthritis, unspecified site: Secondary | ICD-10-CM | POA: Diagnosis not present

## 2023-03-31 DIAGNOSIS — M8580 Other specified disorders of bone density and structure, unspecified site: Secondary | ICD-10-CM | POA: Diagnosis not present

## 2023-03-31 DIAGNOSIS — M8448XA Pathological fracture, other site, initial encounter for fracture: Secondary | ICD-10-CM

## 2023-03-31 DIAGNOSIS — M479 Spondylosis, unspecified: Secondary | ICD-10-CM | POA: Diagnosis not present

## 2023-03-31 DIAGNOSIS — F419 Anxiety disorder, unspecified: Secondary | ICD-10-CM | POA: Diagnosis not present

## 2023-03-31 DIAGNOSIS — I10 Essential (primary) hypertension: Secondary | ICD-10-CM | POA: Diagnosis not present

## 2023-03-31 DIAGNOSIS — R413 Other amnesia: Secondary | ICD-10-CM | POA: Diagnosis not present

## 2023-03-31 DIAGNOSIS — E785 Hyperlipidemia, unspecified: Secondary | ICD-10-CM | POA: Diagnosis not present

## 2023-03-31 DIAGNOSIS — F05 Delirium due to known physiological condition: Secondary | ICD-10-CM | POA: Diagnosis not present

## 2023-03-31 DIAGNOSIS — M272 Inflammatory conditions of jaws: Secondary | ICD-10-CM

## 2023-04-07 ENCOUNTER — Telehealth: Payer: Self-pay | Admitting: Neurology

## 2023-04-07 NOTE — Telephone Encounter (Signed)
Terry Mcneil called wanting to inform provider that per daughter she is being discharged due to pt being placed under Hospice care.

## 2023-06-02 ENCOUNTER — Other Ambulatory Visit: Payer: Self-pay | Admitting: Neurology

## 2023-07-08 ENCOUNTER — Other Ambulatory Visit: Payer: Self-pay | Admitting: Neurology

## 2023-07-08 NOTE — Telephone Encounter (Signed)
 Rx refilled per last appointment note.

## 2023-08-05 ENCOUNTER — Other Ambulatory Visit: Payer: Self-pay | Admitting: Neurology

## 2023-08-18 ENCOUNTER — Encounter: Payer: Self-pay | Admitting: Neurology

## 2023-08-19 ENCOUNTER — Other Ambulatory Visit: Payer: Self-pay | Admitting: Family

## 2023-08-25 ENCOUNTER — Ambulatory Visit: Payer: Medicare PPO | Admitting: Neurology

## 2023-09-09 ENCOUNTER — Other Ambulatory Visit: Payer: Self-pay | Admitting: Neurology

## 2023-10-18 ENCOUNTER — Other Ambulatory Visit: Payer: Self-pay | Admitting: Family

## 2024-01-15 ENCOUNTER — Encounter: Payer: Self-pay | Admitting: Neurology

## 2024-01-15 ENCOUNTER — Telehealth: Payer: Self-pay

## 2024-01-15 NOTE — Telephone Encounter (Signed)
 I can continue to see the patient virtually for now.  Please advise hospice and the daughter-in-law.

## 2024-01-15 NOTE — Telephone Encounter (Signed)
 Copied from CRM #[id number]. Topic: Clinical - Medical Advice >> Jan 15, 2024  2:25 PM [place] J wrote: Reason for CRM: Patient has been in hospice for a year and is being discharged by Wednesday. The patient is home bound and is needing an at home provider since she would be unable to come into the clinic moving forward. The patients daughter in law was also wondering if Eleanor would be willing to do virtual only appointments with the patient, in order to make things easier, it not, she would like to know any options Eleanor thinks would be best for the patient and her current situation.   Hospice is wanting to know who the patient's provider will be before the patient is discharged. Please call Aalyiah Camberos [phone]) for any updates on this matter.

## 2024-01-15 NOTE — Telephone Encounter (Signed)
 Hospice discharging patient. Please advise of plan of care for home bound patient moving forward.

## 2024-01-15 NOTE — Telephone Encounter (Signed)
 Patient's daughter has been advised of this information. She will let hospice know so that patient can be discharged. She will call for virtual visit once this is completed.

## 2024-01-19 MED ORDER — LEVETIRACETAM 250 MG PO TABS: 250.0000 mg | ORAL_TABLET | Freq: Two times a day (BID) | ORAL | 5 refills | Status: AC

## 2024-01-19 MED ORDER — LACOSAMIDE 100 MG PO TABS: 100.0000 mg | ORAL_TABLET | Freq: Two times a day (BID) | ORAL | 5 refills | Status: AC

## 2024-01-19 NOTE — Telephone Encounter (Signed)
 Requested Prescriptions   Pending Prescriptions Disp Refills   Lacosamide  100 MG TABS 60 tablet 5    Sig: Take 1 tablet (100 mg total) by mouth 2 (two) times daily.   levETIRAcetam  (KEPPRA ) 250 MG tablet 60 tablet 5    Sig: Take 1 tablet (250 mg total) by mouth 2 (two) times daily.   Last seen 02/06/23, next appt not scheduled. Placed 02/11/24 at 1:45pm on hold for the pt  Sending reply to pt to schedule video visit and routing to provider to refill meds as requested by pt   Dispenses   Dispensed Days Supply Quantity Provider Pharmacy  lacosamide  100 mg tablet 12/24/2023 15 30 each BLUMSTEIN,WENDY SUE FNP [facility] - ...  lacosamide  100 mg tablet 12/12/2023 15 30 each BLUMSTEIN,WENDY SUE FNP [facility] - ...  lacosamide  100 mg tablet 11/25/2023 15 30 each Oneita Rakers, MD [facility] - ...  lacosamide  100 mg tablet 11/11/2023 15 30 each Oneita Rakers, MD [facility] - ...  lacosamide  100 mg tablet 10/29/2023 15 30 each BLUMSTEIN,WENDY SUE FNP [facility] - ...  lacosamide  100 mg tablet 10/16/2023 15 30 each Branson Bourbon, MD [facility] - ...  lacosamide  100 mg tablet 09/29/2023 15 30 each BLUMSTEIN,WENDY SUE FNP [facility] - ...  lacosamide  100 mg tablet 09/13/2023 15 30 each BLUMSTEIN,WENDY SUE FNP [facility] - ...  lacosamide  100 mg tablet 08/28/2023 15 30 each BLUMSTEIN,WENDY SUE FNP [facility] - ...  LACOSAMIDE  100MG     TAB 08/01/2023 30 60 each Gregg Lek, MD [facility] [phone] ...  LACOSAMIDE   100 MG TABS 07/04/2023 30 60 tablet Camara, Amadou, MD [facility] [phone] ...  LACOSAMIDE  100MG     TAB 05/22/2023 30 60 each Gregg Lek, MD [facility] [phone] ...  LACOSAMIDE  100MG     TAB 02/24/2023 90 180 each Gregg Lek, MD [facility] [phone] ...  LACOSAMIDE  100MG     TAB 01/22/2023 30 60 each Gregg Lek, MD [facility] [phone] .SABRASABRA

## 2024-01-22 ENCOUNTER — Encounter: Payer: Self-pay | Admitting: Family

## 2024-01-22 MED ORDER — CITALOPRAM HYDROBROMIDE 20 MG PO TABS: 20.0000 mg | ORAL_TABLET | Freq: Every day | ORAL | 0 refills | Status: DC

## 2024-02-06 ENCOUNTER — Ambulatory Visit: Admitting: Family

## 2024-02-11 ENCOUNTER — Encounter: Payer: Self-pay | Admitting: Neurology

## 2024-02-11 ENCOUNTER — Ambulatory Visit: Admitting: Family

## 2024-02-11 ENCOUNTER — Telehealth (INDEPENDENT_AMBULATORY_CARE_PROVIDER_SITE_OTHER): Admitting: Family

## 2024-02-11 ENCOUNTER — Encounter: Payer: Self-pay | Admitting: Family

## 2024-02-11 ENCOUNTER — Telehealth: Payer: Self-pay | Admitting: Neurology

## 2024-02-11 ENCOUNTER — Telehealth: Admitting: Neurology

## 2024-02-11 VITALS — BP 124/79 | HR 60

## 2024-02-11 DIAGNOSIS — F32A Depression, unspecified: Secondary | ICD-10-CM | POA: Diagnosis not present

## 2024-02-11 DIAGNOSIS — R413 Other amnesia: Secondary | ICD-10-CM

## 2024-02-11 DIAGNOSIS — E785 Hyperlipidemia, unspecified: Secondary | ICD-10-CM

## 2024-02-11 DIAGNOSIS — L0211 Cutaneous abscess of neck: Secondary | ICD-10-CM

## 2024-02-11 DIAGNOSIS — I959 Hypotension, unspecified: Secondary | ICD-10-CM | POA: Diagnosis not present

## 2024-02-11 DIAGNOSIS — M869 Osteomyelitis, unspecified: Secondary | ICD-10-CM | POA: Diagnosis not present

## 2024-02-11 DIAGNOSIS — S069X9D Unspecified intracranial injury with loss of consciousness of unspecified duration, subsequent encounter: Secondary | ICD-10-CM | POA: Diagnosis not present

## 2024-02-11 DIAGNOSIS — C50919 Malignant neoplasm of unspecified site of unspecified female breast: Secondary | ICD-10-CM

## 2024-02-11 DIAGNOSIS — I10 Essential (primary) hypertension: Secondary | ICD-10-CM

## 2024-02-11 DIAGNOSIS — G40909 Epilepsy, unspecified, not intractable, without status epilepticus: Secondary | ICD-10-CM

## 2024-02-11 DIAGNOSIS — Z853 Personal history of malignant neoplasm of breast: Secondary | ICD-10-CM | POA: Diagnosis not present

## 2024-02-11 MED ORDER — CITALOPRAM HYDROBROMIDE 10 MG PO TABS: 10.0000 mg | ORAL_TABLET | Freq: Every day | ORAL | 0 refills | Status: DC

## 2024-02-11 MED ORDER — MIDODRINE HCL 2.5 MG PO TABS: 2.5000 mg | ORAL_TABLET | Freq: Three times a day (TID) | ORAL | 1 refills | Status: AC

## 2024-02-11 NOTE — Telephone Encounter (Signed)
 LVM and sent mychart msg asking pt to call back to schedule follow up with Dr. Gregg for a year. Can be a VV per Dr. Gregg

## 2024-02-11 NOTE — Patient Instructions (Signed)
 Continue with Keppra  250 mg twice daily Continue Vimpat  100 mg twice daily Continue your other medications Continue to follow-up with PCP Follow-up in 1 year or sooner if worse
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
## 2024-02-11 NOTE — Progress Notes (Unsigned)
 MyChart Video Visit    Virtual Visit via Video Note    Patient location: Home. Patient and provider in visit Provider location: Office  I discussed the limitations of evaluation and management by telemedicine and the availability of in person appointments. The patient expressed understanding and agreed to proceed.  Visit Date: 02/11/2024  Today's healthcare provider: Eleanor GORMAN Ponto, NP     Subjective:    Patient ID: Terry Mcneil, female    DOB: 03/10/1941, 83 y.o.   MRN: [id number]  Chief Complaint  Patient presents with   Follow-up    Follow up since being released by hospice 2 weeks ago.    HPI  Past Medical History:  Diagnosis Date   Arthritis    back   Cancer (HCC) 09/2016   left breast cancer   Cataract    Colon polyps    Hypertension    Osteopenia 10/11/2009   Patellar fracture    Right Knee    Urge incontinence     Past Surgical History:  Procedure Laterality Date   BREAST LUMPECTOMY Left 09/2016   COLONOSCOPY     EYE SURGERY Bilateral    Cataracts    HIP CLOSED REDUCTION Left 02/13/2019   Procedure: CLOSED MANIPULATION OF DISLOCATED HIP;  Surgeon: Harden Jerona GAILS, MD;  Location: [facility] OR;  Service: Orthopedics;  Laterality: Left;   ORIF PATELLA Right 08/28/2012   Procedure: OPEN REDUCTION INTERNAL (ORIF) FIXATION PATELLA- RIGHT;  Surgeon: Jerona GAILS Harden, MD;  Location: [facility] OR;  Service: Orthopedics;  Laterality: Right;  Open Reduction Internal Fixation Right Patella   RADIOACTIVE SEED GUIDED PARTIAL MASTECTOMY WITH AXILLARY SENTINEL LYMPH NODE BIOPSY Left 10/03/2016   Procedure: LEFT BREAST RADIOACTIVE SEED GUIDED PARTIAL MASTECTOMY WITH LEFT SENTINEL LYMPH NODE MAPPING;  Surgeon: Vanderbilt Ned, MD;  Location: [facility];  Service: General;  Laterality: Left;   TONSILLECTOMY     TOTAL HIP ARTHROPLASTY Left 01/13/2018   Procedure: LEFT TOTAL HIP ARTHROPLASTY ANTERIOR APPROACH;  Surgeon: Vernetta Lonni GRADE, MD;  Location: [facility] OR;   Service: Orthopedics;  Laterality: Left;   TUBAL LIGATION     VAGINAL DELIVERY     x2 -     Family History  Problem Relation Age of Onset   Heart disease Father 47       valve replacement    Social History   Socioeconomic History   Marital status: Widowed    Spouse name: Tamelia Michalowski   Number of children: 2   Years of education: Not on file   Highest education level: Not on file  Occupational History   Occupation: Retired  Tobacco Use   Smoking status: Former    Current packs/day: 0.00    Types: Cigarettes    Quit date: 08/27/1978    Years since quitting: 45.4   Smokeless tobacco: Never  Vaping Use   Vaping status: Never Used  Substance and Sexual Activity   Alcohol use: Yes    Alcohol/week: 7.0 standard drinks of alcohol    Types: 7 Glasses of wine per week   Drug use: No   Sexual activity: Yes  Other Topics Concern   Not on file  Social History Narrative   Marital Status:  Married ([name]) Husband has been diagnosed with Alzheimers.  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     Alcohol Use:  Moderate (Wine), 2 glasses 3 times a week   Drug Use:  None   Diet:  Weight Watchers    Exercise:  Not regular   Hobbies: Reading , Gardening , Grandchildren            Social Drivers of Health   Financial Resource Strain: Low Risk  (12/06/2019)   Overall Financial Resource Strain (CARDIA)    Difficulty of Paying Living Expenses: Not hard at all  Food Insecurity: No Food Insecurity (12/06/2019)   Hunger Vital Sign    Worried About Running Out of Food in the Last Year: Never true    Ran Out of Food in the Last Year: Never true  Transportation Needs: No Transportation Needs (12/06/2019)   PRAPARE - Therapist, art (Medical): No    Lack of Transportation (Non-Medical): No  Physical Activity: Not on file  Stress: Not on file  Social Connections: Not on file  Intimate Partner Violence: Not on file    Outpatient Medications Prior to Visit  Medication Sig Dispense Refill   citalopram  (CELEXA ) 20 MG tablet Take 1 tablet (20 mg total) by mouth daily. 90 tablet 0   Lacosamide  100 MG TABS Take 1 tablet (100 mg total) by mouth 2 (two) times daily. 60 tablet 5   levETIRAcetam  (KEPPRA ) 250 MG tablet Take 1 tablet (250 mg total) by mouth 2 (two) times daily. 60 tablet 5   midodrine  (PROAMATINE ) 2.5 MG tablet TAKE 1 TABLET BY MOUTH THREE TIMES DAILY WITH MEALS 270 tablet 0   aspirin  EC 81 MG tablet Take 81 mg by mouth daily. Swallow whole.     Melatonin 10 MG TABS Take 30 mg by mouth at bedtime.     doxycycline  (VIBRA -TABS) 100 MG tablet Take 1 tablet (100 mg total) by mouth 2 (two) times daily. 14 tablet 0   Multiple Vitamin (MULTIVITAMIN) tablet Take 1 tablet by mouth daily.     omega-3 acid ethyl esters (LOVAZA ) 1 g capsule Take by mouth 2 (two) times daily.     potassium chloride  (KLOR-CON  M) 10 MEQ tablet Take 1 tablet by mouth daily.     tolterodine  (DETROL  LA) 4 MG 24 hr capsule Take 1 capsule by mouth once daily 90 capsule 0   No facility-administered medications prior to visit.    Allergies  Allergen Reactions   Other Other (See Comments)    Sedation = BVM (??) per notes  Pt with shallow respirations. Assisted by Rt with BVM   Reclast  [Zoledronic  Acid]     Developed bony growths in her mouth.     ROS     Objective:    Physical Exam  There were no vitals taken for this visit. Wt Readings from Last 3 Encounters:  03/12/23 115 lb (52.2 kg)  02/06/23 130 lb (59 kg)  03/25/22 140 lb 9.6 oz (63.8 kg)       Assessment & Plan:   Problem List Items Addressed This Visit   None   I have discontinued Terry S. Salaam's multivitamin, potassium chloride , omega-3  acid ethyl esters, doxycycline , and tolterodine . I am also having her maintain her Melatonin, aspirin  EC, midodrine , Lacosamide , levETIRAcetam , and citalopram .  No orders of the defined types were placed in this encounter.   I discussed the assessment and treatment plan with the patient. The patient was  provided an opportunity to ask questions and all were answered. The patient agreed with the plan and demonstrated an understanding of the instructions.   The patient was advised to call back or seek an in-person evaluation if the symptoms worsen or if the condition fails to improve as anticipated.  I provided *** minutes of face-to-face time during this encounter.   Eleanor GORMAN Ponto, NP [facility] [phone] (phone) [phone] (fax)  [facility]

## 2024-02-12 ENCOUNTER — Encounter: Payer: Self-pay | Admitting: Family

## 2024-02-12 DIAGNOSIS — I959 Hypotension, unspecified: Secondary | ICD-10-CM | POA: Insufficient documentation

## 2024-02-12 DIAGNOSIS — G40909 Epilepsy, unspecified, not intractable, without status epilepticus: Secondary | ICD-10-CM | POA: Insufficient documentation

## 2024-02-12 NOTE — Progress Notes (Addendum)
 MyChart Video Visit    Virtual Visit via Video Note    Patient location: Home. Patient and provider in visit Provider location: Office  I discussed the limitations of evaluation and management by telemedicine and the availability of in person appointments. The patient expressed understanding and agreed to proceed.  Visit Date: 02/11/2024  Today's healthcare provider: Eleanor GORMAN Ponto, NP     Subjective:    Patient ID: Terry Mcneil, female    DOB: 01-06-1941, 83 y.o.   MRN: [id number]  Chief Complaint  Patient presents with   Follow-up    Follow up since being released by hospice 2 weeks ago.    HPI  Terry Mcneil is an 83 year old female with history of breast cancer, who presents for medication review and follow-up after being released from hospice care. [name] home bound and has 24 hour in home care. Her daughter-in-law is present for today's visit.    At our last in person appointment on 03/12/23, she was dealing with a persistent abscess on her left lateral chin.  CT noted left mandible body and symphysis osteomyelitis with pathologic fracture/osteolytic lesions.  They opted for no further treatment of this finding. At this time the abscess on her chin has completely healed.  She denies jaw pain.   She takes Keppra  for seizure prevention but missed a dose recently, resulting in a seizure. Measures are in place to ensure proper medication adherence. Citalopram  20 mg is used for mood stabilization following her husband's death. Her daughter-in-law wonders if she still needs to continue the citalopram  since her depression was situtational.   Midodrine  is used for blood pressure management, though she did not check her blood pressure before the appointment. She has a blood pressure cuff at home.  Family notes some mild soreness in the sacral area.  This has been managed with a gel chair pad and salve, with no skin breakdown.  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   Subdural hematoma, post-traumatic (HCC) 06/27/2021   Urge incontinence     Past Surgical History:  Procedure Laterality Date   BREAST LUMPECTOMY Left 09/2016   COLONOSCOPY     EYE SURGERY Bilateral    Cataracts    HIP CLOSED REDUCTION Left 02/13/2019   Procedure: CLOSED MANIPULATION OF DISLOCATED HIP;  Surgeon: Harden Jerona GAILS, MD;  Location: [facility] OR;  Service: Orthopedics;  Laterality: Left;   ORIF PATELLA Right 08/28/2012   Procedure: OPEN REDUCTION INTERNAL (ORIF) FIXATION PATELLA- RIGHT;  Surgeon: Jerona GAILS Harden, MD;  Location: [facility] OR;  Service: Orthopedics;  Laterality: Right;  Open Reduction Internal Fixation Right Patella   RADIOACTIVE SEED GUIDED PARTIAL MASTECTOMY WITH AXILLARY SENTINEL LYMPH NODE BIOPSY Left 10/03/2016   Procedure: LEFT BREAST RADIOACTIVE SEED GUIDED PARTIAL MASTECTOMY WITH LEFT SENTINEL LYMPH NODE MAPPING;  Surgeon: Vanderbilt Ned, MD;  Location: [facility];  Service: General;  Laterality: Left;   TONSILLECTOMY     TOTAL HIP ARTHROPLASTY Left 01/13/2018   Procedure: LEFT TOTAL HIP ARTHROPLASTY ANTERIOR APPROACH;  Surgeon: Vernetta Lonni GRADE, MD;  Location: [facility] OR;  Service: Orthopedics;  Laterality: Left;  TUBAL LIGATION     VAGINAL DELIVERY     x2 -     Family History  Problem Relation Age of Onset   Heart disease Father 83       valve replacement    Social History   Socioeconomic History   Marital status: Widowed    Spouse name: Calley Drenning   Number of children: 2   Years of education: Not on file   Highest education level: Not on file  Occupational History   Occupation: Retired  Tobacco Use   Smoking status: Former    Current packs/day: 0.00    Types: Cigarettes     Quit date: 08/27/1978    Years since quitting: 45.5   Smokeless tobacco: Never  Vaping Use   Vaping status: Never Used  Substance and Sexual Activity   Alcohol use: Yes    Alcohol/week: 7.0 standard drinks of alcohol    Types: 7 Glasses of wine per week   Drug use: No   Sexual activity: Yes  Other Topics Concern   Not on file  Social History Narrative   Marital Status:  Married ([name]) Husband has been diagnosed with Alzheimers.  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     Alcohol Use:  Moderate (Wine), 2 glasses 3 times a week   Drug Use:  None   Diet:  Weight Watchers    Exercise:  Not regular   Hobbies: Reading , Gardening , Grandchildren            Social Drivers of Health   Financial Resource Strain: Low Risk  (12/06/2019)   Overall Financial Resource Strain (CARDIA)    Difficulty of Paying Living Expenses: Not hard at all  Food Insecurity: No Food Insecurity (12/06/2019)   Hunger Vital Sign    Worried About Running Out of Food in the Last Year: Never true    Ran Out of Food in the Last Year: Never true  Transportation Needs: No Transportation Needs (12/06/2019)   PRAPARE - Administrator, Civil Service (Medical): No    Lack of Transportation (Non-Medical): No  Physical Activity: Not on file  Stress: Not on file  Social Connections: Not on file  Intimate Partner Violence: Not on file    Outpatient Medications Prior to Visit  Medication Sig Dispense Refill   Lacosamide  100 MG TABS Take 1 tablet (100 mg total) by mouth 2 (two) times daily. 60 tablet 5   levETIRAcetam  (KEPPRA ) 250 MG tablet Take 1 tablet (250 mg total) by mouth 2 (two) times daily. 60 tablet 5   citalopram  (CELEXA ) 20 MG tablet  Take 1 tablet (20 mg total) by mouth daily. 90 tablet 0   midodrine  (PROAMATINE ) 2.5 MG tablet TAKE 1 TABLET BY MOUTH THREE TIMES DAILY WITH MEALS 270 tablet 0   aspirin  EC 81 MG tablet Take 81 mg by mouth daily. Swallow whole.     doxycycline  (VIBRA -TABS) 100 MG tablet Take 1 tablet (100 mg total) by mouth 2 (two) times daily. 14 tablet 0   Melatonin 10 MG TABS Take 30 mg by mouth at bedtime.     Multiple Vitamin (MULTIVITAMIN) tablet Take 1 tablet by mouth daily.  omega-3 acid ethyl esters (LOVAZA ) 1 g capsule Take by mouth 2 (two) times daily.     potassium chloride  (KLOR-CON  M) 10 MEQ tablet Take 1 tablet by mouth daily.     tolterodine  (DETROL  LA) 4 MG 24 hr capsule Take 1 capsule by mouth once daily 90 capsule 0   No facility-administered medications prior to visit.    Allergies  Allergen Reactions   Other Other (See Comments)    Sedation = BVM (??) per notes  Pt with shallow respirations. Assisted by Rt with BVM   Reclast  [Zoledronic  Acid]     Developed bony growths in her mouth.     ROS    See HPI Objective:    Physical Exam  BP 124/79   Pulse 60  Wt Readings from Last 3 Encounters:  03/12/23 115 lb (52.2 kg)  02/06/23 130 lb (59 kg)  03/25/22 140 lb 9.6 oz (63.8 kg)    Gen: Awake, alert, no acute distress ENT: some slight left sided jaw fullness noted, no erythema or external sore noted Resp: Breathing is even and non-labored Psych: calm/pleasant demeanor Neuro: Alert and Oriented x 3, + facial symmetry, speech is clear.     Assessment & Plan:   Problem List Items Addressed This Visit       Unprioritized   Seizure disorder (HCC)   Had one breakthrough seizure after missed dose- continue regular dosing of Keppra .       Osteomyelitis (HCC)   With likely bony metastasis to left mandible.  [name] currently pain free, eating and drinking.  She had an out of hospital DNR which is on her refrigerator.  Family has opted not to pursue aggressive measures.        Neck abscess   Resolved.      Hypotension   It looks like midodrine  was started back in 2024 for hypotension.  BP looks good today. Will continue same/monitor.       Relevant Medications   midodrine  (PROAMATINE ) 2.5 MG tablet   Hyperlipidemia   Not currently on treatment.  Patient/family prefer to focus on comfort measures and understand that our assessment is limited due to pt not being able to come into the office and lack of lab work etc.  They are comfortable with video visit only.       Relevant Medications   midodrine  (PROAMATINE ) 2.5 MG tablet   History of breast cancer   Previously treated with radiation/chemo/lumpectomy.       Depression - Primary   Stable on citalopram  20mg . Will try decreasing to 10mg  and plan to reassess in 3 months.      Relevant Medications   citalopram  (CELEXA ) 10 MG tablet    I have discontinued Terry Mcneil multivitamin, potassium chloride , Melatonin, omega-3 acid ethyl esters, aspirin  EC, doxycycline , tolterodine , and citalopram . I have also changed her midodrine . Additionally, I am having her start on citalopram . Lastly, I am having her maintain her Lacosamide  and levETIRAcetam .  Meds ordered this encounter  Medications   midodrine  (PROAMATINE ) 2.5 MG tablet    Sig: Take 1 tablet (2.5 mg total) by mouth 3 (three) times daily with meals.    Dispense:  270 tablet    Refill:  1    Supervising Provider:   DOMENICA BLACKBIRD A [[id number]]   citalopram  (CELEXA ) 10 MG tablet    Sig: Take 1 tablet (10 mg total) by mouth daily.    Dispense:  90 tablet    Refill:  0  Supervising Provider:   DOMENICA HARLENE LABOR [phone]    I discussed the assessment and treatment plan with the patient. The patient was provided an opportunity to ask questions and all were answered. The patient agreed with the plan and demonstrated an understanding of the instructions.   The patient was advised to call back or seek an in-person evaluation if the symptoms worsen or  if the condition fails to improve as anticipated.   Eleanor GORMAN Ponto, NP [facility] [phone] (phone) [phone] (fax)  [facility]

## 2024-02-12 NOTE — Telephone Encounter (Signed)
 1 yr f/u has been scheduled as a my chart vv  Pt understands that although there may be some limitations with this type of visit, we will take all precautions to reduce any security or privacy concerns.  Pt understands that this will be treated like an in office visit and we will file with pt's insurance, and there may be a patient responsible charge related to this service.

## 2024-02-12 NOTE — Assessment & Plan Note (Signed)
 Not currently on treatment.  Patient/family prefer to focus on comfort measures and understand that our assessment is limited due to pt not being able to come into the office and lack of lab work etc.  They are comfortable with video visit only.

## 2024-02-12 NOTE — Assessment & Plan Note (Signed)
 It looks like midodrine  was started back in 2024 for hypotension.  BP looks good today. Will continue same/monitor.

## 2024-02-12 NOTE — Assessment & Plan Note (Signed)
 With likely bony metastasis to left mandible.  [name] currently pain free, eating and drinking.  She had an out of hospital DNR which is on her refrigerator.  Family has opted not to pursue aggressive measures.

## 2024-02-12 NOTE — Assessment & Plan Note (Signed)
 Stable on citalopram  20mg . Will try decreasing to 10mg  and plan to reassess in 3 months.

## 2024-02-12 NOTE — Assessment & Plan Note (Signed)
 Resolved

## 2024-02-12 NOTE — Assessment & Plan Note (Signed)
 Had one breakthrough seizure after missed dose- continue regular dosing of Keppra .

## 2024-02-12 NOTE — Patient Instructions (Addendum)
 VISIT SUMMARY:  Today, we reviewed your medications and discussed your recent health concerns. We addressed your seizure disorder, mood stabilization, blood pressure management, and skin soreness from prolonged sitting.  YOUR PLAN:  SEIZURE DISORDER: You had a recent seizure after missing a dose of Keppra . -Continue taking Keppra  as prescribed to prevent seizures. -Make sure to take your medication consistently to avoid missing doses.  DEPRESSION: Your mood has been stable on citalopram  since your husband's passing. -Reduce citalopram  from 20 mg to 10 mg daily for three months. -We will reassess your mood in three months. -If your mood remains stable, we may consider tapering to 5 mg before discontinuing. -You will be prescribed 10 mg citalopram  tablets for the dose reduction.  ORTHOSTATIC HYPOTENSION: Your blood pressure is managed with midodrine . -Continue taking midodrine  as prescribed. -Monitor your blood pressure regularly and report the results.  SKIN SORENESS DUE TO PRESSURE: You experience skin soreness from prolonged sitting. -Shift positions regularly while sitting to relieve pressure. -Continue using the gel pad for comfort.

## 2024-02-12 NOTE — Assessment & Plan Note (Signed)
 Previously treated with radiation/chemo/lumpectomy.

## 2024-02-13 ENCOUNTER — Encounter: Payer: Self-pay | Admitting: Neurology

## 2024-02-16 ENCOUNTER — Other Ambulatory Visit: Payer: Self-pay | Admitting: Neurology

## 2024-02-16 MED ORDER — TRAZODONE HCL 50 MG PO TABS: 50.0000 mg | ORAL_TABLET | Freq: Every day | ORAL | 0 refills | Status: DC

## 2024-02-23 ENCOUNTER — Telehealth: Payer: Self-pay | Admitting: Family

## 2024-02-23 NOTE — Telephone Encounter (Signed)
 Copied from CRM [id number]. Topic: Clinical - Home Health Verbal Orders >> Feb 23, 2024  4:08 PM Maisie BROCKS wrote: Caller/Agency: [facility]  Callback Number: [phone] Service Requested: palliative care  Any new concerns about the patient? No

## 2024-03-15 ENCOUNTER — Other Ambulatory Visit: Payer: Self-pay | Admitting: Neurology

## 2024-03-15 NOTE — Telephone Encounter (Signed)
 Last seen on 02/11/24 Follow up scheduled on 02/10/25

## 2024-05-04 ENCOUNTER — Other Ambulatory Visit: Payer: Self-pay | Admitting: Family

## 2024-05-04 DIAGNOSIS — F32A Depression, unspecified: Secondary | ICD-10-CM

## 2024-06-11 ENCOUNTER — Ambulatory Visit

## 2024-07-13 ENCOUNTER — Ambulatory Visit: Admitting: Family

## 2025-02-10 ENCOUNTER — Telehealth: Admitting: Neurology
